# Patient Record
Sex: Female | Born: 1960
Health system: Southern US, Community
[De-identification: ages and names within clinical notes are randomized; demographics above are authoritative.]

## PROBLEM LIST (undated history)

## (undated) DIAGNOSIS — G43909 Migraine, unspecified, not intractable, without status migrainosus: Secondary | ICD-10-CM

## (undated) DIAGNOSIS — T8859XA Other complications of anesthesia, initial encounter: Secondary | ICD-10-CM

## (undated) DIAGNOSIS — N209 Urinary calculus, unspecified: Secondary | ICD-10-CM

## (undated) DIAGNOSIS — K5900 Constipation, unspecified: Secondary | ICD-10-CM

## (undated) HISTORY — DX: Migraine, unspecified, not intractable, without status migrainosus: G43.909

## (undated) HISTORY — DX: Urinary calculus, unspecified: N20.9

## (undated) HISTORY — DX: Constipation, unspecified: K59.00

## (undated) HISTORY — PX: OTHER SURGICAL HISTORY: SHX169

## (undated) HISTORY — PX: KIDNEY STONE SURGERY: SHX686

---

## 2002-02-18 ENCOUNTER — Encounter: Payer: Self-pay | Admitting: Internal Medicine

## 2002-02-18 ENCOUNTER — Inpatient Hospital Stay (HOSPITAL_COMMUNITY): Admission: EM | Admit: 2002-02-18 | Discharge: 2002-02-18 | Payer: Self-pay | Admitting: Emergency Medicine

## 2002-02-18 HISTORY — PX: ESOPHAGOGASTRODUODENOSCOPY: SHX1529

## 2002-02-23 ENCOUNTER — Encounter: Payer: Self-pay | Admitting: Internal Medicine

## 2002-02-23 ENCOUNTER — Ambulatory Visit (HOSPITAL_COMMUNITY): Admission: RE | Admit: 2002-02-23 | Discharge: 2002-02-23 | Payer: Self-pay | Admitting: Internal Medicine

## 2008-08-08 ENCOUNTER — Ambulatory Visit (HOSPITAL_COMMUNITY): Admission: RE | Admit: 2008-08-08 | Discharge: 2008-08-08 | Payer: Self-pay | Admitting: Internal Medicine

## 2008-08-17 ENCOUNTER — Ambulatory Visit (HOSPITAL_COMMUNITY): Admission: RE | Admit: 2008-08-17 | Discharge: 2008-08-17 | Payer: Self-pay | Admitting: Internal Medicine

## 2008-08-23 ENCOUNTER — Encounter (INDEPENDENT_AMBULATORY_CARE_PROVIDER_SITE_OTHER): Payer: Self-pay | Admitting: Internal Medicine

## 2008-08-23 ENCOUNTER — Encounter: Admission: RE | Admit: 2008-08-23 | Discharge: 2008-08-23 | Payer: Self-pay | Admitting: Internal Medicine

## 2008-08-23 HISTORY — PX: BREAST BIOPSY: SHX20

## 2009-03-13 ENCOUNTER — Encounter: Admission: RE | Admit: 2009-03-13 | Discharge: 2009-03-13 | Payer: Self-pay | Admitting: Internal Medicine

## 2009-08-22 ENCOUNTER — Encounter: Admission: RE | Admit: 2009-08-22 | Discharge: 2009-08-22 | Payer: Self-pay | Admitting: Internal Medicine

## 2009-10-31 ENCOUNTER — Ambulatory Visit (HOSPITAL_COMMUNITY): Admission: RE | Admit: 2009-10-31 | Discharge: 2009-10-31 | Payer: Self-pay | Admitting: Internal Medicine

## 2009-11-08 ENCOUNTER — Ambulatory Visit (HOSPITAL_COMMUNITY): Admission: RE | Admit: 2009-11-08 | Discharge: 2009-11-08 | Payer: Self-pay | Admitting: Internal Medicine

## 2010-07-15 ENCOUNTER — Encounter: Payer: Self-pay | Admitting: Internal Medicine

## 2010-07-17 ENCOUNTER — Other Ambulatory Visit: Payer: Self-pay | Admitting: Internal Medicine

## 2010-07-17 DIAGNOSIS — Z1239 Encounter for other screening for malignant neoplasm of breast: Secondary | ICD-10-CM

## 2010-08-27 ENCOUNTER — Ambulatory Visit
Admission: RE | Admit: 2010-08-27 | Discharge: 2010-08-27 | Disposition: A | Discharge status: Home / Self Care | Payer: BC Managed Care – PPO | Source: Ambulatory Visit | Attending: Internal Medicine | Admitting: Internal Medicine

## 2010-08-27 DIAGNOSIS — Z1239 Encounter for other screening for malignant neoplasm of breast: Secondary | ICD-10-CM

## 2010-11-08 NOTE — Op Note (Signed)
NAME:  Jasmine Fox, Jasmine Fox                         ACCOUNT NO.:  [id number]   MEDICAL RECORD NO.:  [id number]                   PATIENT TYPE:  INP   LOCATION:  A303                                 FACILITY:  [facility]   PHYSICIAN:  Gerrit Friends. Rourk, M.D.               DATE OF BIRTH:  12/07/1960   DATE OF PROCEDURE:  02/18/2002  DATE OF DISCHARGE:                                 OPERATIVE REPORT   PROCEDURE:  Esophagogastroduodenoscopy with Elease Hashimoto dilation.   ENDOSCOPIST:  Gerrit Friends. Rourk, M.D.   INDICATIONS FOR PROCEDURE:  The patient is a 50 year old lady who was  admitted to the hospital last night after an episode of acute dysphagia  after eating some fish sticks.  She had a sensation that food hung up and  she had some vomiting.  She is much better today.  She has had a couple of  episodes recently.  She does not have typical reflux symptoms although she  is belching quite a bit.  This has been ongoing for quite some time.  EGD is  now being done to further evaluate his symptoms.  This approach has been  discussed with the patient previously.  The potential risks, benefits, and  alternatives have been reviewed; and questions answered.  She is agreeable.  Please see the documentation in the markedly for more information.  I feel  that she at low risk for conscious sedation in the way of Versed and  Demerol.   DESCRIPTION OF PROCEDURE:  O2 saturation, blood pressure, pulse and  respirations were monitored throughout the entire procedure.   CONSCIOUS SEDATION:  Versed 5 mg IV, Demerol 75 mg IV in divided doses,  Cetacaine spray for topical oropharyngeal anesthesia.   INSTRUMENT:  Olympus video chip adult gastroscope.   FINDINGS:  Examination of the tubular esophagus revealed no mucosal  abnormalities.  There is no evidence of ring, stricture, web or other  abnormality.  The EG junction was easily traversed.   STOMACH:  The gastric cavity was empty.  It insufflated well with air.   A  thorough examination of the gastric mucosa including a retroflex view of the  proximal stomach and esophagogastric junction demonstrated no abnormalities.  The pylorus was patent and easily traversed.   DUODENUM:  The bulb and the second portion appeared normal.   THERAPY/DIAGNOSTIC MANEUVERS:  A 54 French Maloney dilator was passed to  full insertion without blood return with good patient tolerance.  A look  back into the esophagus and stomach revealed no apparent complication  related to the passage of the dilator.   The patient tolerated the procedure well and was reacted in endoscopy.   IMPRESSION:  1. Normal esophagus, stomach, and duodenum through the second portion.  2. Status post passage of 9 French Maloney dilator.  3. I suspect that the patient may well be having reflux symptoms over the     superimposed  element of globus.  4. It is possible that she swallowed too large of a bolus of food last     evening and then experienced a transient impaction in an otherwise normal     caliber esophagus.   RECOMMENDATIONS:  1. Low fat diet.  2. We will treat her with a PPI in the way of Protonix 40 mg orally daily.  3. Would discontinue Reglan and Pepcid after today.  4. Home same.  5. Would pursue further GI workup only if symptoms persist or recur.                                               Gerrit Friends. Rourk, M.D.    RMR/MEDQ  D:  02/18/2002  T:  02/19/2002  Job:  [id number]   cc:   Madelin Rear. Sherwood Gambler, M.D.  [address]  [place]  [place] [address]  Fax: [phone]   Dr. Owens Shark

## 2010-11-08 NOTE — Consult Note (Signed)
   NAME:  Jasmine Fox, Jasmine Fox                      ACCOUNT NO.:  [id number]   MEDICAL RECORD NO.:  [id number]                   PATIENT TYPE:  INP   LOCATION:  A303                                 FACILITY:  [facility]   PHYSICIAN:  Elfredia Nevins, MD                  DATE OF BIRTH:  1960-10-18   DATE OF CONSULTATION:  DATE OF DISCHARGE:  02/18/2002                                   CONSULTATION   HISTORY OF PRESENT ILLNESS:  For details regarding admission, please refer  to note.  Briefly, this 50 year old female with a history of migraine  headaches and urolithiasis presented to the emergency department with a  chief complaint of belching over the last several months.  She also had  cervical dysphagia.  She was admitted for further workup and evaluation.   HOSPITAL COURSE:  The patient did well with symptomatic therapy.  GI was  consulted.  An upper endoscopy was performed, which revealed mild stricture.  A Maloney dilator was employed.  Dr. Luvenia Starch assessment was suspected GERD  with an element of globus.  The patient did well and was stable for  discharge.   DISPOSITION:  Protonix 40 mg q.d.  She will be followed in three weeks as an  outpatient.                                                   Elfredia Nevins, MD    LF/MEDQ  D:  04/12/2002  T:  04/12/2002  Job:  [id number]

## 2010-11-08 NOTE — H&P (Signed)
NAME:  Jasmine Fox, Jasmine Fox                         ACCOUNT NO.:  [id number]   MEDICAL RECORD NO.:  [id number]                   PATIENT TYPE:  INP   LOCATION:  A303                                 FACILITY:  [facility]   PHYSICIAN:  Isidor Holts, M.D.               DATE OF BIRTH:  09-Dec-1960   DATE OF ADMISSION:  02/17/2002  DATE OF DISCHARGE:                                HISTORY & PHYSICAL   CHIEF COMPLAINT:  Lump in throat, burping excessively.   HISTORY OF PRESENT ILLNESS:  This is a 50 year old Caucasian female, history  essentially as above, who was quite well apart from increased tendency to  belch over the last couple of months.  Had dinner at 9:30 p.m. on February 17, 2002 which consisted of fish sticks and small potatoes.  At about  10 p.m. she felt as if something was stuck in her throat and found it  difficult to breath.  She stuck a finger down her throat and induced  vomiting without much relief.  Eventually came to the emergency room at  [facility].  Has been belching constantly since the episode  started.  No previous history of similar episodes.   PAST MEDICAL HISTORY:  1. Migraine headaches.  2. Urolithiasis.   ALLERGIES:  No known drug allergies.   MEDICATIONS:  1. Maxalt p.r.n.  2. Oral contraceptive medication.   SOCIAL HISTORY:  Single, no offspring.  Nonsmoker.  No history of alcohol  use or drug abuse.   SYSTEMS REVIEW:  Unremarkable.   PHYSICAL EXAMINATION:  VITAL SIGNS:  Temperature 97.9, pulse 97,  respirations 16, blood pressure 127/35.  GENERAL:  Burping a lot during the examination but was not short of breath  at rest.  Appeared somewhat anxious.  HEENT:  No clinical pallor, no jaundice, no conjunctival injection.  Throat  was clear.  NECK:  Supple.  She has a shotty nontender, pea-sized right jugular  digastric lymph node.  No other lymphadenopathy was palpable.  There was no  palpable goiter.  JVP was not seen.  CHEST:  Clinically  clear to auscultation.  There were no wheezes or  crackles.  HEART:  Heart sounds were normal, 1 and 2 were heard, regular.  There were  no murmurs.  ABDOMEN:  Flat, soft, nontender.  There was no palpable organomegaly.  Bowel  sounds were normal.  LOWER EXTREMITIES:  Unremarkable.   LABORATORY INVESTIGATIONS:  CBC:  WBC 8.7, hemoglobin 11.1, hematocrit 33.4,  platelets 314.  Chemistry:  Sodium 136, potassium 3.5, chloride 106,  carbonate 29, BUN 8, creatinine 0.7, glucose 126.   Chest x-ray was essentially normal.   ASSESSMENT/PLAN:  Admit to general medical floor.   1. Dysphagia.  Differential diagnosis includes esophageal ring,     gastroesophageal reflux disease/esophagitis, globus hystericus, foreign     body.  Will keep n.p.o. for now.  Commence intravenous fluids, proton  pump inhibitors, and antiemetics.  Request GI consultation for possible     EGD, i.e. Dr. Karilyn Cota.  2. History of migraine, stable.   Further management will depend on clinical course.                                                Isidor Holts, M.D.    CO/MEDQ  D:  02/18/2002  T:  02/18/2002  Job:  [id number]   cc:   Madelin Rear. Sherwood Gambler, M.D.  [address]  [place]  [place] [address]  Fax: [phone]

## 2010-11-08 NOTE — Consult Note (Signed)
NAME:  Jasmine Fox, Jasmine Fox                         ACCOUNT NO.:  [id number]   MEDICAL RECORD NO.:  [id number]                   PATIENT TYPE:  INP   LOCATION:  A303                                 FACILITY:  [facility]   PHYSICIAN:  Gerrit Friends. Rourk, M.D.               DATE OF BIRTH:  1960-09-19   DATE OF CONSULTATION:  02/18/2002  DATE OF DISCHARGE:                                   CONSULTATION   PHYSICIAN REQUESTING CONSULTATION:  Dr. Isidor Holts for Dr. Madelin Rear.  Fusco.   REASON FOR CONSULTATION:  Dysphagia.   HISTORY OF PRESENT ILLNESS:  The patient is a pleasant 50 year old Caucasian  female with a history of migraine headaches and urolithiasis, who presented  to the emergency department late yesterday evening with complaints of  dysphagia and difficulty swallowing.  She had eaten dinner about 9:30 p.m.  last night, which consisted of fish sticks and potatoes.  She felt like the  food was stuck in her esophagus.  She developed difficulty breathing.  She  induced vomiting, however, did not get any relief; she therefore came to the  emergency department.  She also complains of excessive belching which  occurred last night as well as for the last several months.  She has had a  couple of episodes of possible esophageal food impaction this week after  eating a sandwich.  She was able to relieve this by vomiting.  Denies any  typical heartburn symptoms, nausea, abdominal pain, melena, bright red blood  per rectum, constipation or diarrhea.  She complains of some odynophagia  with this particular episode.  Denies any weight loss.  She takes Goody's  powders at least twice a week for migraine headache.  No other NSAIDs.  This  morning, she feels like the food has finally passed.  She is not having any  difficulty swallowing, breathing or pain.  She is currently on Pepcid 20 mg  IV q.12h. and Reglan 10 mg IV q.8h.   MEDICATIONS PRIOR TO ADMISSION:  1. Maxalt p.r.n.  2. Oral  contraceptive pills.  3. Goody's powders, two per week.  4. Tylenol p.r.n.   ALLERGIES:  No known drug allergies.   PAST MEDICAL HISTORY:  Migraine headache; history of urolithiasis, diagnosed  in the ED, however, the patient denies passing a stone.   FAMILY HISTORY:  Negative for chronic GI illnesses or colorectal cancer.  Father had gallbladder disease.   SOCIAL HISTORY:  She is currently going through a divorce.  She has no  children.  No history of tobacco use.  No alcohol.   REVIEW OF SYSTEMS:  Please see HPI for GI and GENERAL.  CARDIOPULMONARY:  Denies any chest pain or diaphoresis with this episode.   PHYSICAL EXAMINATION:  VITAL SIGNS:  Height 5 feet 5 inches.  Weight 127.4.  Temperature 97.7, T current 97.2; pulse 71; respirations 18; blood pressure  102/41.  GENERAL:  A very pleasant, well-nourished, well-developed Caucasian female  in no acute distress.  SKIN:  Warm and dry.  No jaundice.  HEENT:  Conjunctivae are pink.  Sclerae nonicteric.  Pupils equal, round and  reactive to light.  Oropharyngeal mucosa moist and pink.  No lesions,  erythema or exudate.  NECK:  No lymphadenopathy or thyromegaly.  CHEST:  Lungs are clear to auscultation.  CARDIAC:  Exam reveals regular rate and rhythm, normal S1 and S2, no  murmurs, rubs, or gallops.  ABDOMEN:  Positive bowel sounds.  Flat, nondistended, nontender.  No  organomegaly or masses.  EXTREMITIES:  No edema.   LABORATORY DATA:  Hemoglobin 11.1, hematocrit 33.4, MCV 84.5, WBC 8.7,  platelets 314,000.  PT 12.8, INR 1.1, PTT 26.  Sodium 139, potassium 3.8,  BUN 7, creatinine 0.6, glucose 92, calcium 8.3.   IMPRESSION:  The patient is a pleasant 50 year old Caucasian female with a  one-week history of dysphagia to solid foods and possible esophageal food  impaction on two to three occasions; she had one last night which would not  resolve with induced vomiting, however, now, she feel like the food has  finally passed.   She also complains of a several-month history of excessive  belching.  I suspect that she has developed an esophageal web, ring or  stricture.  She uses nonsteroidal anti-inflammatory drugs regularly.  Excessive belching could be a symptom of gastroesophageal reflux disease but  also would consider biliary etiology.  She is noted to have mild normocytic  anemia on admission, maybe secondary to menstruation or a current upper  gastrointestinal etiology and should be further investigated.   SUGGESTIONS:  EGD today.  Continue Pepcid for now.  Based on findings, I  would consider further workup of anemia.   I would like to thank Dr. Brien Few for allowing [place] to take part in the care of  this patient.      Tana Coast, P.A.                        Gerrit Friends. Rourk, M.D.    LL/MEDQ  D:  02/18/2002  T:  02/18/2002  Job:  [id number]   cc:   Madelin Rear. Sherwood Gambler, M.D.  [address]  [place]  [place] [address]  Fax: [phone]   Gerrit Friends. Rourk, M.D.

## 2011-04-18 ENCOUNTER — Encounter: Payer: Self-pay | Admitting: Internal Medicine

## 2011-05-08 ENCOUNTER — Other Ambulatory Visit: Payer: BC Managed Care – PPO | Admitting: Internal Medicine

## 2011-05-22 ENCOUNTER — Other Ambulatory Visit: Payer: BC Managed Care – PPO | Admitting: Internal Medicine

## 2011-06-03 ENCOUNTER — Other Ambulatory Visit: Payer: BC Managed Care – PPO | Admitting: Internal Medicine

## 2011-07-21 ENCOUNTER — Other Ambulatory Visit: Payer: Self-pay | Admitting: Internal Medicine

## 2011-07-21 DIAGNOSIS — Z1231 Encounter for screening mammogram for malignant neoplasm of breast: Secondary | ICD-10-CM

## 2011-08-28 ENCOUNTER — Ambulatory Visit
Admission: RE | Admit: 2011-08-28 | Discharge: 2011-08-28 | Disposition: A | Discharge status: Home / Self Care | Payer: BC Managed Care – PPO | Source: Ambulatory Visit | Attending: Internal Medicine | Admitting: Internal Medicine

## 2011-08-28 DIAGNOSIS — Z1231 Encounter for screening mammogram for malignant neoplasm of breast: Secondary | ICD-10-CM

## 2012-07-26 ENCOUNTER — Other Ambulatory Visit: Payer: Self-pay | Admitting: Internal Medicine

## 2012-07-26 DIAGNOSIS — Z1231 Encounter for screening mammogram for malignant neoplasm of breast: Secondary | ICD-10-CM

## 2012-08-31 ENCOUNTER — Ambulatory Visit: Payer: BC Managed Care – PPO

## 2012-09-07 ENCOUNTER — Ambulatory Visit: Payer: BC Managed Care – PPO

## 2012-09-24 ENCOUNTER — Ambulatory Visit (INDEPENDENT_AMBULATORY_CARE_PROVIDER_SITE_OTHER): Payer: BC Managed Care – PPO

## 2012-09-24 ENCOUNTER — Encounter: Payer: Self-pay | Admitting: Obstetrics & Gynecology

## 2012-09-24 ENCOUNTER — Ambulatory Visit (INDEPENDENT_AMBULATORY_CARE_PROVIDER_SITE_OTHER): Payer: BC Managed Care – PPO | Admitting: Obstetrics & Gynecology

## 2012-09-24 VITALS — BP 140/72 | Ht 65.0 in | Wt 126.0 lb

## 2012-09-24 DIAGNOSIS — N83209 Unspecified ovarian cyst, unspecified side: Secondary | ICD-10-CM | POA: Insufficient documentation

## 2012-09-24 NOTE — Patient Instructions (Signed)
Ovarian Cyst The ovaries are small organs that are on each side of the uterus. The ovaries are the organs that produce the female hormones, estrogen and progesterone. An ovarian cyst is a sac filled with fluid that can vary in its size. It is normal for a small cyst to form in women who are in the childbearing age and who have menstrual periods. This type of cyst is called a follicle cyst that becomes an ovulation cyst (corpus luteum cyst) after it produces the women's egg. It later goes away on its own if the woman does not become pregnant. There are other kinds of ovarian cysts that may cause problems and may need to be treated. The most serious problem is a cyst with cancer. It should be noted that menopausal women who have an ovarian cyst are at a higher risk of it being a cancer cyst. They should be evaluated very quickly, thoroughly and followed closely. This is especially true in menopausal women because of the high rate of ovarian cancer in women in menopause. CAUSES AND TYPES OF OVARIAN CYSTS:  FUNCTIONAL CYST: The follicle/corpus luteum cyst is a functional cyst that occurs every month during ovulation with the menstrual cycle. They go away with the next menstrual cycle if the woman does not get pregnant. Usually, there are no symptoms with a functional cyst.  ENDOMETRIOMA CYST: This cyst develops from the lining of the uterus tissue. This cyst gets in or on the ovary. It grows every month from the bleeding during the menstrual period. It is also called a "chocolate cyst" because it becomes filled with blood that turns brown. This cyst can cause pain in the lower abdomen during intercourse and with your menstrual period.  CYSTADENOMA CYST: This cyst develops from the cells on the outside of the ovary. They usually are not cancerous. They can get very big and cause lower abdomen pain and pain with intercourse. This type of cyst can twist on itself, cut off its blood supply and cause severe pain. It  also can easily rupture and cause a lot of pain.  DERMOID CYST: This type of cyst is sometimes found in both ovaries. They are found to have different kinds of body tissue in the cyst. The tissue includes skin, teeth, hair, and/or cartilage. They usually do not have symptoms unless they get very big. Dermoid cysts are rarely cancerous.  POLYCYSTIC OVARY: This is a rare condition with hormone problems that produces many small cysts on both ovaries. The cysts are follicle-like cysts that never produce an egg and become a corpus luteum. It can cause an increase in body weight, infertility, acne, increase in body and facial hair and lack of menstrual periods or rare menstrual periods. Many women with this problem develop type 2 diabetes. The exact cause of this problem is unknown. A polycystic ovary is rarely cancerous.  THECA LUTEIN CYST: Occurs when too much hormone (human chorionic gonadotropin) is produced and over-stimulates the ovaries to produce an egg. They are frequently seen when doctors stimulate the ovaries for invitro-fertilization (test tube babies).  LUTEOMA CYST: This cyst is seen during pregnancy. Rarely it can cause an obstruction to the birth canal during labor and delivery. They usually go away after delivery. SYMPTOMS   Pelvic pain or pressure.  Pain during sexual intercourse.  Increasing girth (swelling) of the abdomen.  Abnormal menstrual periods.  Increasing pain with menstrual periods.  You stop having menstrual periods and you are not pregnant. DIAGNOSIS  The diagnosis can   be made during:  Routine or annual pelvic examination (common).  Ultrasound.  X-ray of the pelvis.  CT Scan.  MRI.  Blood tests. TREATMENT   Treatment may only be to follow the cyst monthly for 2 to 3 months with your caregiver. Many go away on their own, especially functional cysts.  May be aspirated (drained) with a long needle with ultrasound, or by laparoscopy (inserting a tube into  the pelvis through a small incision).  The whole cyst can be removed by laparoscopy.  Sometimes the cyst may need to be removed through an incision in the lower abdomen.  Hormone treatment is sometimes used to help dissolve certain cysts.  Birth control pills are sometimes used to help dissolve certain cysts. HOME CARE INSTRUCTIONS  Follow your caregiver's advice regarding:  Medicine.  Follow up visits to evaluate and treat the cyst.  You may need to come back or make an appointment with another caregiver, to find the exact cause of your cyst, if your caregiver is not a gynecologist.  Get your yearly and recommended pelvic examinations and Pap tests.  Let your caregiver know if you have had an ovarian cyst in the past. SEEK MEDICAL CARE IF:   Your periods are late, irregular, they stop, or are painful.  Your stomach (abdomen) or pelvic pain does not go away.  Your stomach becomes larger or swollen.  You have pressure on your bladder or trouble emptying your bladder completely.  You have painful sexual intercourse.  You have feelings of fullness, pressure, or discomfort in your stomach.  You lose weight for no apparent reason.  You feel generally ill.  You become constipated.  You lose your appetite.  You develop acne.  You have an increase in body and facial hair.  You are gaining weight, without changing your exercise and eating habits.  You think you are pregnant. SEEK IMMEDIATE MEDICAL CARE IF:   You have increasing abdominal pain.  You feel sick to your stomach (nausea) and/or vomit.  You develop a fever that comes on suddenly.  You develop abdominal pain during a bowel movement.  Your menstrual periods become heavier than usual. Document Released: 06/09/2005 Document Revised: 09/01/2011 Document Reviewed: 04/12/2009 [employer] Patient Information 2013 [employer].  

## 2012-09-24 NOTE — Progress Notes (Signed)
Patient ID: Jasmine Fox, female   DOB: 01-18-1961, 52 y.o.   MRN: [id number] Referred for evaluation of an ovarin cyst seen on CT scan last Friday, 3.29.2014.  Patient had middle of the night onset of nausea, vomiting and diarrhea, no fever.  Had CT scan at [facility] and was negative for an appendix but found a 5 cm right ovarian cyst.  She is here for evaluation.  Exam NEFG Vagina pink moist no disharge Uterus NSSC NT Ovaries feel normal no masses palpable  Sonogram reveals normal uterus and ovaries with no free fluid noted at all  Impression No current evidence of  Cyst no evidence of  Cystic rupture  Plan  No specific follow up needed, although pt needs yearly exam  Kendryck Lacroix H 11:00 AM 09/24/2012

## 2012-09-28 ENCOUNTER — Ambulatory Visit
Admission: RE | Admit: 2012-09-28 | Discharge: 2012-09-28 | Disposition: A | Discharge status: Home / Self Care | Payer: BC Managed Care – PPO | Source: Ambulatory Visit | Attending: Internal Medicine | Admitting: Internal Medicine

## 2012-09-28 DIAGNOSIS — Z1231 Encounter for screening mammogram for malignant neoplasm of breast: Secondary | ICD-10-CM

## 2012-10-11 ENCOUNTER — Telehealth: Payer: Self-pay

## 2012-10-11 NOTE — Telephone Encounter (Signed)
Pt called to schedule colonoscopy. Having some constipation. OV with Tana Coast, PA on 10/26/2012 at 8:00 AM.

## 2012-10-26 ENCOUNTER — Encounter: Payer: Self-pay | Admitting: Gastroenterology

## 2012-10-26 ENCOUNTER — Ambulatory Visit (INDEPENDENT_AMBULATORY_CARE_PROVIDER_SITE_OTHER): Payer: BC Managed Care – PPO | Admitting: Gastroenterology

## 2012-10-26 ENCOUNTER — Encounter (HOSPITAL_COMMUNITY): Payer: Self-pay | Admitting: Pharmacy Technician

## 2012-10-26 VITALS — BP 101/65 | HR 61 | Temp 96.8°F | Ht 65.0 in | Wt 124.2 lb

## 2012-10-26 DIAGNOSIS — K5909 Other constipation: Secondary | ICD-10-CM | POA: Insufficient documentation

## 2012-10-26 DIAGNOSIS — R1903 Right lower quadrant abdominal swelling, mass and lump: Secondary | ICD-10-CM | POA: Insufficient documentation

## 2012-10-26 DIAGNOSIS — K5900 Constipation, unspecified: Secondary | ICD-10-CM

## 2012-10-26 MED ORDER — POLYETHYLENE GLYCOL 3350 17 GM/SCOOP PO POWD: ORAL | Status: DC

## 2012-10-26 MED ORDER — PEG 3350-KCL-NA BICARB-NACL 420 G PO SOLR: 4000.0000 mL | ORAL | Status: DC

## 2012-10-26 NOTE — Progress Notes (Signed)
CC PCP 

## 2012-10-26 NOTE — Assessment & Plan Note (Signed)
52 year old lady with chronic constipation, worse of the last several years. No prior colonoscopy. No family history of colon cancer.   1. Begin MiraLax 17 g twice a day for 3 days followed by once daily as needed for constipation. 2. Colonoscopy in the near future.  I have discussed the risks, alternatives, benefits with regards to but not limited to the risk of reaction to medication, bleeding, infection, perforation and the patient is agreeable to proceed. Written consent to be obtained.

## 2012-10-26 NOTE — Progress Notes (Signed)
Primary Care Physician:  Cassell Smiles., MD  Primary Gastroenterologist:  Roetta Sessions, MD   Chief Complaint  Patient presents with  . Constipation    schedule colonoscopy    HPI:  Jasmine Fox is a 52 y.o. female here to schedule first ever colonoscopy. Worried about 5cm right lower abdominal cyst seen on CT 08/2012.  Described as 5.1 cm septated cystic process in the right pelvis, more likely of ovarian than appendiceal origin. Patient reports that she saw all her OB/GYN and family tree and underwent pelvic ultrasound with no abnormality seen related to the ovaries. She denies any abdominal pain or bloating. She is postmenopausal. Denies any weight loss. Chronic constipation worse over the last several years. Has tried metamucil but doesn't help. Drinks a lot of water. Eat fruits. Walks daily. Lots of straining when gets an urge. No rectal bleeding. Couple BMs per week. Belching a lot at night after evening meal. No heartburn. No dysphagia. No melena. Rarely takes Goody's powders.   Current Outpatient Prescriptions  Medication Sig Dispense Refill  . NON FORMULARY Goody powders only occasionally       No current facility-administered medications for this visit.    Allergies as of 10/26/2012  . (No Known Allergies)    Past Medical History  Diagnosis Date  . Migraine   . Urolithiasis     Past Surgical History  Procedure Laterality Date  . Lt bunion surgery    . Esophagogastroduodenoscopy  02/18/2002    ZOX:WRUEAV esophagus/s/p 54 [place] Maloney dilator  . Kidney stone surgery      Family History  Problem Relation Age of Onset  . Heart disease Mother     congestive heart failure  . Diabetes Father   . Hypertension Father   . Cancer Brother     brain cancer  . Hypertension Brother   . Colon cancer Neg Hx     History   Social History  . Marital Status: Married    Spouse Name: N/A    Number of Children: 0  . Years of Education: N/A   Occupational History  .   [employer]   Social History Main Topics  . Smoking status: Former Games developer  . Smokeless tobacco: Not on file     Comment: Only about 2 packs cigarettes in lifetime  . Alcohol Use: No  . Drug Use: No  . Sexually Active: No   Other Topics Concern  . Not on file   Social History Narrative  . No narrative on file      ROS:  General: Negative for anorexia, weight loss, fever, chills, fatigue, weakness. Eyes: Negative for vision changes.  ENT: Negative for hoarseness, difficulty swallowing , nasal congestion. CV: Negative for chest pain, angina, palpitations, dyspnea on exertion, peripheral edema.  Respiratory: Negative for dyspnea at rest, dyspnea on exertion, cough, sputum, wheezing.  GI: See history of present illness. GU:  Negative for dysuria, hematuria, urinary incontinence, urinary frequency, nocturnal urination.  MS: Negative for joint pain, low back pain.  Derm: Negative for rash or itching.  Neuro: Negative for weakness, abnormal sensation, seizure, frequent headaches, memory loss, confusion.  Psych: Negative for anxiety, depression, suicidal ideation, hallucinations.  Endo: Negative for unusual weight change.  Heme: Negative for bruising or bleeding. Allergy: Negative for rash or hives.    Physical Examination:  BP 101/65  Pulse 61  Temp(Src) 96.8 F (36 C)  Ht 5\' 5"  (1.651 m)  Wt 124 lb 3.2 oz (56.337 kg)  BMI 20.67  kg/m2   General: Well-nourished, well-developed in no acute distress.  Head: Normocephalic, atraumatic.   Eyes: Conjunctiva pink, no icterus. Mouth: Oropharyngeal mucosa moist and pink , no lesions erythema or exudate. Neck: Supple without thyromegaly, masses, or lymphadenopathy.  Lungs: Clear to auscultation bilaterally.  Heart: Regular rate and rhythm, no murmurs rubs or gallops.  Abdomen: Bowel sounds are normal, nontender, nondistended, no hepatosplenomegaly or masses, no abdominal bruits or    hernia , no rebound or guarding.   Rectal:  deferred Extremities: No lower extremity edema. No clubbing or deformities.  Neuro: Alert and oriented x 4 , grossly normal neurologically.  Skin: Warm and dry, no rash or jaundice.   Psych: Alert and cooperative, normal mood and affect.    CT of the abdomen pelvis without contrast done at [facility], March 2014    IMPRESSION:  1. Mild wedge compression fracture deformity of L1, new since 11/08/2009 2. 5.1 cm septated cystic process in the right pelvis, more likely of ovarian than appendiceal origin. Pelvic ultrasound suggested for further characterization. 3. Stable benign hepatic hemangiomas.

## 2012-10-26 NOTE — Patient Instructions (Addendum)
1. Begin MiraLax 17 g twice a day for 3 days. Then continue daily as needed for constipation. 2. We have scheduled you for a colonoscopy with Dr. Jena Gauss. Please see separate instructions. 3. I will review your CT scan with the radiologist at [facility].

## 2012-10-26 NOTE — Assessment & Plan Note (Signed)
History of 5.1 cm right pelvic cystic mass. Pelvic ultrasound by Dr. Despina Hidden was unremarkable. Will review CT with Dr. Tyron Russell. Further recommendations to follow. Colonoscopy as scheduled.

## 2012-11-10 ENCOUNTER — Ambulatory Visit (HOSPITAL_COMMUNITY)
Admission: RE | Admit: 2012-11-10 | Discharge: 2012-11-10 | Disposition: A | Discharge status: Home / Self Care | Payer: BC Managed Care – PPO | Source: Ambulatory Visit | Attending: Internal Medicine | Admitting: Internal Medicine

## 2012-11-10 ENCOUNTER — Encounter (HOSPITAL_COMMUNITY): Payer: Self-pay

## 2012-11-10 ENCOUNTER — Encounter (HOSPITAL_COMMUNITY)
Admission: RE | Disposition: A | Discharge status: Home / Self Care | Payer: Self-pay | Source: Ambulatory Visit | Attending: Internal Medicine

## 2012-11-10 DIAGNOSIS — Z1211 Encounter for screening for malignant neoplasm of colon: Secondary | ICD-10-CM

## 2012-11-10 DIAGNOSIS — K5909 Other constipation: Secondary | ICD-10-CM

## 2012-11-10 DIAGNOSIS — R1903 Right lower quadrant abdominal swelling, mass and lump: Secondary | ICD-10-CM

## 2012-11-10 HISTORY — PX: COLONOSCOPY: SHX5424

## 2012-11-10 SURGERY — COLONOSCOPY
Anesthesia: Moderate Sedation

## 2012-11-10 MED ORDER — ONDANSETRON HCL 4 MG/2ML IJ SOLN
INTRAMUSCULAR | Status: AC
  Filled 2012-11-10: qty 2

## 2012-11-10 MED ORDER — SODIUM CHLORIDE 0.9 % IV SOLN
INTRAVENOUS | Status: DC
  Administered 2012-11-10: 11:00:00 via INTRAVENOUS

## 2012-11-10 MED ORDER — ONDANSETRON HCL 4 MG/2ML IJ SOLN
INTRAMUSCULAR | Status: DC | PRN
  Administered 2012-11-10: 4 mg via INTRAVENOUS

## 2012-11-10 MED ORDER — MIDAZOLAM HCL 5 MG/5ML IJ SOLN
INTRAMUSCULAR | Status: AC
  Filled 2012-11-10: qty 10

## 2012-11-10 MED ORDER — MEPERIDINE HCL 100 MG/ML IJ SOLN
INTRAMUSCULAR | Status: AC
  Filled 2012-11-10: qty 1

## 2012-11-10 MED ORDER — MEPERIDINE HCL 100 MG/ML IJ SOLN
INTRAMUSCULAR | Status: DC | PRN
  Administered 2012-11-10 (×2): 50 mg via INTRAVENOUS

## 2012-11-10 MED ORDER — MIDAZOLAM HCL 5 MG/5ML IJ SOLN
INTRAMUSCULAR | Status: DC | PRN
  Administered 2012-11-10: 2 mg via INTRAVENOUS
  Administered 2012-11-10: 1 mg via INTRAVENOUS
  Administered 2012-11-10: 2 mg via INTRAVENOUS
  Administered 2012-11-10 (×2): 1 mg via INTRAVENOUS

## 2012-11-10 MED ORDER — STERILE WATER FOR IRRIGATION IR SOLN
Status: DC | PRN
  Administered 2012-11-10: 11:00:00

## 2012-11-10 NOTE — Op Note (Signed)
[facility] [address] [place], [address]   COLONOSCOPY PROCEDURE REPORT  PATIENT: Jasmine Fox, Jasmine Fox  MR#:         [id number] BIRTHDATE: [birth year] , 51  yrs. old GENDER: Female ENDOSCOPIST: R.  Roetta Sessions, MD FACP FACG REFERRED BY:  Artis Delay, M.D.  Duane Lope, M.D. PROCEDURE DATE:  11/10/2012 PROCEDURE:     Screening ileocolonoscopy  INDICATIONS: First ever average risk screening examination  INFORMED CONSENT:  The risks, benefits, alternatives and imponderables including but not limited to bleeding, perforation as well as the possibility of a missed lesion have been reviewed.  The potential for biopsy, lesion removal, etc. have also been discussed.  Questions have been answered.  All parties agreeable. Please see the history and physical in the medical record for more information.  MEDICATIONS: Versed 7 mg IV and Demerol 100 mg IV in divided doses. Zofran 4 mg IV  DESCRIPTION OF PROCEDURE:  After a digital rectal exam was performed, the EC-[id number] ([id number])  colonoscope was advanced from the anus through the rectum and colon to the area of the cecum, ileocecal valve and appendiceal orifice.  The cecum was deeply intubated.  These structures were well-seen and photographed for the record.  From the level of the cecum and ileocecal valve, the scope was slowly and cautiously withdrawn.  The mucosal surfaces were carefully surveyed utilizing scope tip deflection to facilitate fold flattening as needed.  The scope was pulled down into the rectum where a thorough examination including retroflexion was performed.    FINDINGS:  Adequate preparation. Normal rectum. Normal appearing colonic mucosa. The distal 5 cm of terminal ileal mucosa appeared normal.  THERAPEUTIC / DIAGNOSTIC MANEUVERS PERFORMED:  None  COMPLICATIONS: None  CECAL WITHDRAWAL TIME:  10 minutes  IMPRESSION:  Normal ileocolonoscopy  RECOMMENDATIONS: Repeat screening colonoscopy in  10 years   _______________________________ eSigned:  R. Roetta Sessions, MD FACP [facility] 11/10/2012 12:03 PM   CC:

## 2012-11-10 NOTE — H&P (View-Only) (Signed)
Primary Care Physician:  FUSCO,LAWRENCE J., MD  Primary Gastroenterologist:  Michael Rourk, MD   Chief Complaint  Patient presents with  . Constipation    schedule colonoscopy    HPI:  Jasmine Fox is a 51 y.o. female here to schedule first ever colonoscopy. Worried about 5cm right lower abdominal cyst seen on CT 08/2012.  Described as 5.1 cm septated cystic process in the right pelvis, more likely of ovarian than appendiceal origin. Patient reports that she saw all her OB/GYN and family tree and underwent pelvic ultrasound with no abnormality seen related to the ovaries. She denies any abdominal pain or bloating. She is postmenopausal. Denies any weight loss. Chronic constipation worse over the last several years. Has tried metamucil but doesn't help. Drinks a lot of water. Eat fruits. Walks daily. Lots of straining when gets an urge. No rectal bleeding. Couple BMs per week. Belching a lot at night after evening meal. No heartburn. No dysphagia. No melena. Rarely takes Goody's powders.   Current Outpatient Prescriptions  Medication Sig Dispense Refill  . NON FORMULARY Goody powders only occasionally       No current facility-administered medications for this visit.    Allergies as of 10/26/2012  . (No Known Allergies)    Past Medical History  Diagnosis Date  . Migraine   . Urolithiasis     Past Surgical History  Procedure Laterality Date  . Lt bunion surgery    . Esophagogastroduodenoscopy  02/18/2002    RMR:Normal esophagus/s/p 54 French Maloney dilator  . Kidney stone surgery      Family History  Problem Relation Age of Onset  . Heart disease Mother     congestive heart failure  . Diabetes Father   . Hypertension Father   . Cancer Brother     brain cancer  . Hypertension Brother   . Colon cancer Neg Hx     History   Social History  . Marital Status: Married    Spouse Name: N/A    Number of Children: 0  . Years of Education: N/A   Occupational History  .   [employer]   Social History Main Topics  . Smoking status: Former Smoker  . Smokeless tobacco: Not on file     Comment: Only about 2 packs cigarettes in lifetime  . Alcohol Use: No  . Drug Use: No  . Sexually Active: No   Other Topics Concern  . Not on file   Social History Narrative  . No narrative on file      ROS:  General: Negative for anorexia, weight loss, fever, chills, fatigue, weakness. Eyes: Negative for vision changes.  ENT: Negative for hoarseness, difficulty swallowing , nasal congestion. CV: Negative for chest pain, angina, palpitations, dyspnea on exertion, peripheral edema.  Respiratory: Negative for dyspnea at rest, dyspnea on exertion, cough, sputum, wheezing.  GI: See history of present illness. GU:  Negative for dysuria, hematuria, urinary incontinence, urinary frequency, nocturnal urination.  MS: Negative for joint pain, low back pain.  Derm: Negative for rash or itching.  Neuro: Negative for weakness, abnormal sensation, seizure, frequent headaches, memory loss, confusion.  Psych: Negative for anxiety, depression, suicidal ideation, hallucinations.  Endo: Negative for unusual weight change.  Heme: Negative for bruising or bleeding. Allergy: Negative for rash or hives.    Physical Examination:  BP 101/65  Pulse 61  Temp(Src) 96.8 F (36 C)  Ht 5' 5" (1.651 m)  Wt 124 lb 3.2 oz (56.337 kg)  BMI 20.67   kg/m2   General: Well-nourished, well-developed in no acute distress.  Head: Normocephalic, atraumatic.   Eyes: Conjunctiva pink, no icterus. Mouth: Oropharyngeal mucosa moist and pink , no lesions erythema or exudate. Neck: Supple without thyromegaly, masses, or lymphadenopathy.  Lungs: Clear to auscultation bilaterally.  Heart: Regular rate and rhythm, no murmurs rubs or gallops.  Abdomen: Bowel sounds are normal, nontender, nondistended, no hepatosplenomegaly or masses, no abdominal bruits or    hernia , no rebound or guarding.   Rectal:  deferred Extremities: No lower extremity edema. No clubbing or deformities.  Neuro: Alert and oriented x 4 , grossly normal neurologically.  Skin: Warm and dry, no rash or jaundice.   Psych: Alert and cooperative, normal mood and affect.    CT of the abdomen pelvis without contrast done at [facility], March 2014    IMPRESSION:  1. Mild wedge compression fracture deformity of L1, new since 11/08/2009 2. 5.1 cm septated cystic process in the right pelvis, more likely of ovarian than appendiceal origin. Pelvic ultrasound suggested for further characterization. 3. Stable benign hepatic hemangiomas.   

## 2012-11-10 NOTE — Interval H&P Note (Signed)
History and Physical Interval Note:  11/10/2012 11:14 AM  Jasmine Fox  has presented today for surgery, with the diagnosis of CONSTIPATION, RIGHT PELVIC MASS ON CT  The various methods of treatment have been discussed with the patient and family. After consideration of risks, benefits and other options for treatment, the patient has consented to  Procedure(s) with comments: COLONOSCOPY (N/A) - 11:15 as a surgical intervention .  The patient's history has been reviewed, patient examined, no change in status, stable for surgery.  I have reviewed the patient's chart and labs.  Questions were answered to the patient's satisfaction.      Colonoscopy-first ever screening examination. Constipation better with MiraLax.  The risks, benefits, limitations, alternatives and imponderables have been reviewed with the patient. Questions have been answered. All parties are agreeable.   Eula Listen

## 2012-11-12 ENCOUNTER — Encounter (HOSPITAL_COMMUNITY): Payer: Self-pay | Admitting: Internal Medicine

## 2012-11-26 ENCOUNTER — Telehealth: Payer: Self-pay | Admitting: Gastroenterology

## 2012-11-26 NOTE — Telephone Encounter (Addendum)
Message copied by Tiffany Kocher on Fri Nov 26, 2012  8:05 AM ------      Message from: Corbin Ade      Created: Wed Nov 24, 2012  7:53 AM       I do recall looking at the CT with the radiologist. Nonspecific finding. Subsequent vaginal ultrasound revealed no pelvic mass. Dr. Despina Hidden told no patient further evaluation needed.       ----- Message -----         From: Tiffany Kocher, PA-C         Sent: 11/22/2012  10:01 PM           To: Corbin Ade, MD            Did you ever look at the CT on this one? She had large pelvic cystic lesion.

## 2013-09-26 ENCOUNTER — Other Ambulatory Visit: Payer: BC Managed Care – PPO | Admitting: Obstetrics & Gynecology

## 2013-10-04 ENCOUNTER — Other Ambulatory Visit (HOSPITAL_COMMUNITY)
Admission: RE | Admit: 2013-10-04 | Discharge: 2013-10-04 | Disposition: A | Discharge status: Home / Self Care | Payer: BC Managed Care – PPO | Source: Ambulatory Visit | Attending: Obstetrics & Gynecology | Admitting: Obstetrics & Gynecology

## 2013-10-04 ENCOUNTER — Other Ambulatory Visit: Payer: Self-pay

## 2013-10-04 ENCOUNTER — Ambulatory Visit (INDEPENDENT_AMBULATORY_CARE_PROVIDER_SITE_OTHER): Payer: BC Managed Care – PPO | Admitting: Obstetrics & Gynecology

## 2013-10-04 ENCOUNTER — Encounter: Payer: Self-pay | Admitting: Obstetrics & Gynecology

## 2013-10-04 VITALS — BP 110/80 | Ht 64.0 in | Wt 130.0 lb

## 2013-10-04 DIAGNOSIS — Z1231 Encounter for screening mammogram for malignant neoplasm of breast: Secondary | ICD-10-CM

## 2013-10-04 DIAGNOSIS — Z01419 Encounter for gynecological examination (general) (routine) without abnormal findings: Secondary | ICD-10-CM | POA: Insufficient documentation

## 2013-10-04 DIAGNOSIS — Z1151 Encounter for screening for human papillomavirus (HPV): Secondary | ICD-10-CM | POA: Insufficient documentation

## 2013-10-04 NOTE — Progress Notes (Signed)
Patient ID: Jasmine Fox, female   DOB: 02/18/1961, 53 y.o.   MRN: [id number] Subjective:     Jasmine PeaSharon D Munch is a 53 y.o. female here for a routine exam.  No LMP recorded. Patient is postmenopausal. G1P1 Birth Control Method:  None, post menopausal Menstrual Calendar(currently): na  Current complaints: none.   Current acute medical issues:  none   Recent Gynecologic History No LMP recorded. Patient is postmenopausal. Last Pap: 2014,  normal Last mammogram: 2104,  normal  Past Medical History  Diagnosis Date  . Migraine     none since 2013  . Urolithiasis     Past Surgical History  Procedure Laterality Date  . Lt bunion surgery    . Esophagogastroduodenoscopy  02/18/2002    WUJ:[id number]:Normal esophagus/s/p 54 [place] Maloney dilator  . Kidney stone surgery    . Colonoscopy N/A 11/10/2012    Procedure: COLONOSCOPY;  Surgeon: Corbin Adeobert M Rourk, MD;  Location: [facility];  Service: Endoscopy;  Laterality: N/A;  11:15    OB History   Grav Para Term Preterm Abortions TAB SAB Ect Mult Living   1 1              History   Social History  . Marital Status: Married    Spouse Name: N/A    Number of Children: 0  . Years of Education: N/A   Occupational History  .  [employer]   Social History Main Topics  . Smoking status: Former Games developermoker  . Smokeless tobacco: None     Comment: Only about 2 packs cigarettes in lifetime  . Alcohol Use: No  . Drug Use: No  . Sexual Activity: No   Other Topics Concern  . None   Social History Narrative  . None    Family History  Problem Relation Age of Onset  . Heart disease Mother     congestive heart failure  . Diabetes Father   . Hypertension Father   . Cancer Brother     brain cancer  . Hypertension Brother   . Colon cancer Neg Hx       Medication List       This list is accurate as of: 10/04/13 10:05 AM.  Always use your most recent med list.               NON FORMULARY  Goody powders only occasionally     polyethylene glycol  powder powder  Commonly known as:  GLYCOLAX/MIRALAX  Take 1 capful twice daily for 3 days, then once daily as needed for constipation     polyethylene glycol-electrolytes 420 G solution  Commonly known as:  TRILYTE  Take 4,000 mLs by mouth as directed.        Review of Systems  Review of Systems  Constitutional: Negative for fever, chills, weight loss, malaise/fatigue and diaphoresis.  HENT: Negative for hearing loss, ear pain, nosebleeds, congestion, sore throat, neck pain, tinnitus and ear discharge.   Eyes: Negative for blurred vision, double vision, photophobia, pain, discharge and redness.  Respiratory: Negative for cough, hemoptysis, sputum production, shortness of breath, wheezing and stridor.   Cardiovascular: Negative for chest pain, palpitations, orthopnea, claudication, leg swelling and PND.  Gastrointestinal: negative for abdominal pain. Negative for heartburn, nausea, vomiting, diarrhea, constipation, blood in stool and melena.  Genitourinary: Negative for dysuria, urgency, frequency, hematuria and flank pain.  Musculoskeletal: Negative for myalgias, back pain, joint pain and falls.  Skin: Negative for itching and rash.  Neurological: Negative for  dizziness, tingling, tremors, sensory change, speech change, focal weakness, seizures, loss of consciousness, weakness and headaches.  Endo/Heme/Allergies: Negative for environmental allergies and polydipsia. Does not bruise/bleed easily.  Psychiatric/Behavioral: Negative for depression, suicidal ideas, hallucinations, memory loss and substance abuse. The patient is not nervous/anxious and does not have insomnia.        Objective:    Physical Exam  Vitals reviewed. Constitutional: She is oriented to person, place, and time. She appears well-developed and well-nourished.  HENT:  Head: Normocephalic and atraumatic.        Right Ear: External ear normal.  Left Ear: External ear normal.  Nose: Nose normal.  Mouth/Throat:  Oropharynx is clear and moist.  Eyes: Conjunctivae and EOM are normal. Pupils are equal, round, and reactive to light. Right eye exhibits no discharge. Left eye exhibits no discharge. No scleral icterus.  Neck: Normal range of motion. Neck supple. No tracheal deviation present. No thyromegaly present.  Cardiovascular: Normal rate, regular rhythm, normal heart sounds and intact distal pulses.  Exam reveals no gallop and no friction rub.   No murmur heard. Respiratory: Effort normal and breath sounds normal. No respiratory distress. She has no wheezes. She has no rales. She exhibits no tenderness.  GI: Soft. Bowel sounds are normal. She exhibits no distension and no mass. There is no tenderness. There is no rebound and no guarding.  Genitourinary:  Breasts no masses skin changes or nipple changes bilaterally      Vulva is normal without lesions Vagina is pink moist without discharge Cervix normal in appearance and pap is done Uterus is normal size shape and contour Adnexa is negative with normal sized ovaries   Musculoskeletal: Normal range of motion. She exhibits no edema and no tenderness.  Neurological: She is alert and oriented to person, place, and time. She has normal reflexes. She displays normal reflexes. No cranial nerve deficit. She exhibits normal muscle tone. Coordination normal.  Skin: Skin is warm and dry. No rash noted. No erythema. No pallor.  Psychiatric: She has a normal mood and affect. Her behavior is normal. Judgment and thought content normal.       Assessment:    Healthy female exam.    Plan:    Follow up in: 1 year.

## 2013-10-10 ENCOUNTER — Telehealth: Payer: Self-pay | Admitting: Obstetrics & Gynecology

## 2013-10-10 NOTE — Telephone Encounter (Signed)
Pt aware of results 

## 2013-10-25 ENCOUNTER — Encounter (INDEPENDENT_AMBULATORY_CARE_PROVIDER_SITE_OTHER): Payer: Self-pay

## 2013-10-25 ENCOUNTER — Ambulatory Visit
Admission: RE | Admit: 2013-10-25 | Discharge: 2013-10-25 | Disposition: A | Discharge status: Home / Self Care | Payer: BC Managed Care – PPO | Source: Ambulatory Visit

## 2013-10-25 DIAGNOSIS — Z1231 Encounter for screening mammogram for malignant neoplasm of breast: Secondary | ICD-10-CM

## 2013-12-22 ENCOUNTER — Other Ambulatory Visit: Payer: Self-pay | Admitting: Gastroenterology

## 2014-04-03 ENCOUNTER — Other Ambulatory Visit (HOSPITAL_COMMUNITY): Payer: Self-pay | Admitting: Internal Medicine

## 2014-04-03 DIAGNOSIS — R0989 Other specified symptoms and signs involving the circulatory and respiratory systems: Secondary | ICD-10-CM

## 2014-04-03 DIAGNOSIS — R109 Unspecified abdominal pain: Secondary | ICD-10-CM

## 2014-04-06 ENCOUNTER — Ambulatory Visit (HOSPITAL_COMMUNITY)
Admission: RE | Admit: 2014-04-06 | Discharge: 2014-04-06 | Disposition: A | Discharge status: Home / Self Care | Payer: BC Managed Care – PPO | Source: Ambulatory Visit | Attending: Internal Medicine | Admitting: Internal Medicine

## 2014-04-06 DIAGNOSIS — R109 Unspecified abdominal pain: Secondary | ICD-10-CM | POA: Diagnosis not present

## 2014-04-06 DIAGNOSIS — R0989 Other specified symptoms and signs involving the circulatory and respiratory systems: Secondary | ICD-10-CM | POA: Diagnosis not present

## 2014-04-06 DIAGNOSIS — Z87891 Personal history of nicotine dependence: Secondary | ICD-10-CM | POA: Diagnosis not present

## 2014-04-24 ENCOUNTER — Encounter: Payer: Self-pay | Admitting: Obstetrics & Gynecology

## 2014-09-22 ENCOUNTER — Other Ambulatory Visit: Payer: Self-pay

## 2014-09-22 DIAGNOSIS — Z1231 Encounter for screening mammogram for malignant neoplasm of breast: Secondary | ICD-10-CM

## 2014-10-10 ENCOUNTER — Other Ambulatory Visit: Payer: Self-pay | Admitting: Obstetrics & Gynecology

## 2014-10-17 ENCOUNTER — Other Ambulatory Visit: Payer: Self-pay | Admitting: Obstetrics & Gynecology

## 2014-10-20 ENCOUNTER — Ambulatory Visit (INDEPENDENT_AMBULATORY_CARE_PROVIDER_SITE_OTHER): Payer: BLUE CROSS/BLUE SHIELD | Admitting: Obstetrics & Gynecology

## 2014-10-20 ENCOUNTER — Encounter: Payer: Self-pay | Admitting: Obstetrics & Gynecology

## 2014-10-20 ENCOUNTER — Other Ambulatory Visit (HOSPITAL_COMMUNITY)
Admission: RE | Admit: 2014-10-20 | Discharge: 2014-10-20 | Disposition: A | Discharge status: Home / Self Care | Payer: BLUE CROSS/BLUE SHIELD | Source: Ambulatory Visit | Attending: Obstetrics & Gynecology | Admitting: Obstetrics & Gynecology

## 2014-10-20 VITALS — BP 108/62 | HR 60 | Ht 64.25 in | Wt 128.0 lb

## 2014-10-20 DIAGNOSIS — Z1211 Encounter for screening for malignant neoplasm of colon: Secondary | ICD-10-CM

## 2014-10-20 DIAGNOSIS — Z1212 Encounter for screening for malignant neoplasm of rectum: Secondary | ICD-10-CM

## 2014-10-20 DIAGNOSIS — Z01419 Encounter for gynecological examination (general) (routine) without abnormal findings: Secondary | ICD-10-CM

## 2014-10-20 LAB — HEMOCCULT GUIAC POC 1CARD (OFFICE): FECAL OCCULT BLD: NEGATIVE

## 2014-10-20 NOTE — Progress Notes (Signed)
Patient ID: Jasmine Fox, female   DOB: [birth year], 54 y.o.   MRN: [id number] Subjective:     Jasmine Fox is a 54 y.o. female here for a routine exam.  No LMP recorded. Patient is postmenopausal. G1P1 Birth Control Method:  Post menopausal Menstrual Calendar(currently): none  Current complaints: constipation.   Current acute medical issues:  no   Recent Gynecologic History No LMP recorded. Patient is postmenopausal. Last Pap: 2015,  normal Last mammogram: 2015,  normal  Past Medical History  Diagnosis Date  . Migraine     none since 2013  . Urolithiasis   . Constipation     Past Surgical History  Procedure Laterality Date  . Lt bunion surgery    . Esophagogastroduodenoscopy  02/18/2002    WUJ:WJXBJY esophagus/s/p 54 [place] Maloney dilator  . Kidney stone surgery    . Colonoscopy N/A 11/10/2012    Procedure: COLONOSCOPY;  Surgeon: Corbin Ade, MD;  Location: [facility];  Service: Endoscopy;  Laterality: N/A;  11:15    OB History    Gravida Para Term Preterm AB TAB SAB Ectopic Multiple Living   1 1              History   Social History  . Marital Status: Married    Spouse Name: N/A  . Number of Children: 0  . Years of Education: N/A   Occupational History  .  [employer]   Social History Main Topics  . Smoking status: Former Games developer  . Smokeless tobacco: Never Used     Comment: Only about 2 packs cigarettes in lifetime  . Alcohol Use: No  . Drug Use: No  . Sexual Activity: Not Currently   Other Topics Concern  . None   Social History Narrative    Family History  Problem Relation Age of Onset  . Heart disease Mother     congestive heart failure  . Diabetes Father   . Hypertension Father   . Cancer Brother     brain cancer  . Hypertension Brother   . Colon cancer Neg Hx      Current outpatient prescriptions:  .  polyethylene glycol powder (GLYCOLAX/MIRALAX) powder, TAKE 1 CAPFUL TWICE DAILY FOR 3 DAYS, THEN ONCE DAILY AS NEEDED FOR  CONSTIPATION, Disp: 527 g, Rfl: 5 .  NON FORMULARY, Goody powders only occasionally, Disp: , Rfl:  .  polyethylene glycol-electrolytes (TRILYTE) 420 G solution, Take 4,000 mLs by mouth as directed. (Patient not taking: Reported on 10/20/2014), Disp: 4000 mL, Rfl: 0  Review of Systems  Review of Systems  Constitutional: Negative for fever, chills, weight loss, malaise/fatigue and diaphoresis.  HENT: Negative for hearing loss, ear pain, nosebleeds, congestion, sore throat, neck pain, tinnitus and ear discharge.   Eyes: Negative for blurred vision, double vision, photophobia, pain, discharge and redness.  Respiratory: Negative for cough, hemoptysis, sputum production, shortness of breath, wheezing and stridor.   Cardiovascular: Negative for chest pain, palpitations, orthopnea, claudication, leg swelling and PND.  Gastrointestinal: negative for abdominal pain. Negative for heartburn, nausea, vomiting, diarrhea, constipation, blood in stool and melena.  Genitourinary: Negative for dysuria, urgency, frequency, hematuria and flank pain.  Musculoskeletal: Negative for myalgias, back pain, joint pain and falls.  Skin: Negative for itching and rash.  Neurological: Negative for dizziness, tingling, tremors, sensory change, speech change, focal weakness, seizures, loss of consciousness, weakness and headaches.  Endo/Heme/Allergies: Negative for environmental allergies and polydipsia. Does not bruise/bleed easily.  Psychiatric/Behavioral: Negative for depression, suicidal ideas,  hallucinations, memory loss and substance abuse. The patient is not nervous/anxious and does not have insomnia.        Objective:  Blood pressure 108/62, pulse 60, height 5' 4.25" (1.632 m), weight 128 lb (58.06 kg).   Physical Exam  Vitals reviewed. Constitutional: She is oriented to person, place, and time. She appears well-developed and well-nourished.  HENT:  Head: Normocephalic and atraumatic.        Right Ear: External  ear normal.  Left Ear: External ear normal.  Nose: Nose normal.  Mouth/Throat: Oropharynx is clear and moist.  Eyes: Conjunctivae and EOM are normal. Pupils are equal, round, and reactive to light. Right eye exhibits no discharge. Left eye exhibits no discharge. No scleral icterus.  Neck: Normal range of motion. Neck supple. No tracheal deviation present. No thyromegaly present.  Cardiovascular: Normal rate, regular rhythm, normal heart sounds and intact distal pulses.  Exam reveals no gallop and no friction rub.   No murmur heard. Respiratory: Effort normal and breath sounds normal. No respiratory distress. She has no wheezes. She has no rales. She exhibits no tenderness.  GI: Soft. Bowel sounds are normal. She exhibits no distension and no mass. There is no tenderness. There is no rebound and no guarding.  Genitourinary:  Breasts no masses skin changes or nipple changes bilaterally      Vulva is normal without lesions Vagina is pink moist without discharge Cervix normal in appearance and pap is done Uterus is normal size shape and contour Adnexa is negative with normal sized ovaries  {Rectal    hemoccult negative, normal tone, no masses  Musculoskeletal: Normal range of motion. She exhibits no edema and no tenderness.  Neurological: She is alert and oriented to person, place, and time. She has normal reflexes. She displays normal reflexes. No cranial nerve deficit. She exhibits normal muscle tone. Coordination normal.  Skin: Skin is warm and dry. No rash noted. No erythema. No pallor.  Psychiatric: She has a normal mood and affect. Her behavior is normal. Judgment and thought content normal.       Assessment:    Healthy female exam.    Plan:    Mammogram ordered. Follow up in: 1 year.

## 2014-10-23 LAB — CYTOLOGY - PAP

## 2014-10-27 ENCOUNTER — Ambulatory Visit
Admission: RE | Admit: 2014-10-27 | Discharge: 2014-10-27 | Disposition: A | Discharge status: Home / Self Care | Payer: BLUE CROSS/BLUE SHIELD | Source: Ambulatory Visit

## 2014-10-27 ENCOUNTER — Telehealth: Payer: Self-pay | Admitting: Obstetrics and Gynecology

## 2014-10-27 DIAGNOSIS — Z1231 Encounter for screening mammogram for malignant neoplasm of breast: Secondary | ICD-10-CM

## 2014-10-27 NOTE — Telephone Encounter (Signed)
Pt aware of pap results.

## 2014-12-28 ENCOUNTER — Other Ambulatory Visit: Payer: Self-pay

## 2014-12-28 MED ORDER — POLYETHYLENE GLYCOL 3350 17 GM/SCOOP PO POWD: 17.0000 g | Freq: Every day | ORAL | Status: DC

## 2015-08-15 ENCOUNTER — Other Ambulatory Visit: Payer: Self-pay

## 2015-08-16 MED ORDER — POLYETHYLENE GLYCOL 3350 17 GM/SCOOP PO POWD: 17.0000 g | Freq: Every day | ORAL | Status: DC

## 2015-08-16 NOTE — Telephone Encounter (Signed)
Please notify patient I can send in 6 months worth, but we haven't seen her in about 2 years and after that will need a follow-up appointment to check on her symptom progression.

## 2015-09-28 DIAGNOSIS — L821 Other seborrheic keratosis: Secondary | ICD-10-CM | POA: Diagnosis not present

## 2015-09-28 DIAGNOSIS — D1801 Hemangioma of skin and subcutaneous tissue: Secondary | ICD-10-CM | POA: Diagnosis not present

## 2015-09-28 DIAGNOSIS — L814 Other melanin hyperpigmentation: Secondary | ICD-10-CM | POA: Diagnosis not present

## 2015-10-01 ENCOUNTER — Other Ambulatory Visit: Payer: Self-pay

## 2015-10-01 DIAGNOSIS — Z1231 Encounter for screening mammogram for malignant neoplasm of breast: Secondary | ICD-10-CM

## 2015-10-29 ENCOUNTER — Ambulatory Visit (INDEPENDENT_AMBULATORY_CARE_PROVIDER_SITE_OTHER): Payer: BLUE CROSS/BLUE SHIELD | Admitting: Obstetrics & Gynecology

## 2015-10-29 ENCOUNTER — Other Ambulatory Visit (HOSPITAL_COMMUNITY)
Admission: RE | Admit: 2015-10-29 | Discharge: 2015-10-29 | Disposition: A | Discharge status: Home / Self Care | Payer: BLUE CROSS/BLUE SHIELD | Source: Ambulatory Visit | Attending: Obstetrics & Gynecology | Admitting: Obstetrics & Gynecology

## 2015-10-29 ENCOUNTER — Encounter: Payer: Self-pay | Admitting: Obstetrics & Gynecology

## 2015-10-29 VITALS — BP 110/70 | HR 60 | Ht 64.0 in | Wt 120.0 lb

## 2015-10-29 DIAGNOSIS — Z01419 Encounter for gynecological examination (general) (routine) without abnormal findings: Secondary | ICD-10-CM

## 2015-10-29 DIAGNOSIS — Z1212 Encounter for screening for malignant neoplasm of rectum: Secondary | ICD-10-CM | POA: Diagnosis not present

## 2015-10-29 DIAGNOSIS — Z1211 Encounter for screening for malignant neoplasm of colon: Secondary | ICD-10-CM

## 2015-10-29 NOTE — Progress Notes (Signed)
Patient ID: Jasmine Fox, female   DOB: [birth year], 55 y.o.   MRN: [id number] Subjective:     Jasmine Fox is a 55 y.o. female here for a routine exam.  No LMP recorded. Patient is postmenopausal. G1P1 Birth Control Method:  menopausal Menstrual Calendar(currently): none  Current complaints: none.   Current acute medical issues:  none   Recent Gynecologic History No LMP recorded. Patient is postmenopausal. Last Pap: 09/2014,  normal Last mammogram: 10/2014,  normal  Past Medical History  Diagnosis Date  . Migraine     none since 2013  . Urolithiasis   . Constipation     Past Surgical History  Procedure Laterality Date  . Lt bunion surgery    . Esophagogastroduodenoscopy  02/18/2002    WUJ:WJXBJY esophagus/s/p 54 [place] Maloney dilator  . Kidney stone surgery    . Colonoscopy N/A 11/10/2012    Procedure: COLONOSCOPY;  Surgeon: Corbin Ade, MD;  Location: [facility];  Service: Endoscopy;  Laterality: N/A;  11:15    OB History    Gravida Para Term Preterm AB TAB SAB Ectopic Multiple Living   1 1              Social History   Social History  . Marital Status: Married    Spouse Name: N/A  . Number of Children: 0  . Years of Education: N/A   Occupational History  .  [employer]   Social History Main Topics  . Smoking status: Former Games developer  . Smokeless tobacco: Never Used     Comment: Only about 2 packs cigarettes in lifetime  . Alcohol Use: No  . Drug Use: No  . Sexual Activity: Not Currently   Other Topics Concern  . None   Social History Narrative    Family History  Problem Relation Age of Onset  . Heart disease Mother     congestive heart failure  . Diabetes Father   . Hypertension Father   . Cancer Brother     brain cancer  . Hypertension Brother   . Colon cancer Neg Hx      Current outpatient prescriptions:  .  polyethylene glycol-electrolytes (TRILYTE) 420 G solution, Take 4,000 mLs by mouth as directed., Disp: 4000 mL, Rfl: 0 .  NON  FORMULARY, Goody powders only occasionally, Disp: , Rfl:   Review of Systems  Review of Systems  Constitutional: Negative for fever, chills, weight loss, malaise/fatigue and diaphoresis.  HENT: Negative for hearing loss, ear pain, nosebleeds, congestion, sore throat, neck pain, tinnitus and ear discharge.   Eyes: Negative for blurred vision, double vision, photophobia, pain, discharge and redness.  Respiratory: Negative for cough, hemoptysis, sputum production, shortness of breath, wheezing and stridor.   Cardiovascular: Negative for chest pain, palpitations, orthopnea, claudication, leg swelling and PND.  Gastrointestinal: negative for abdominal pain. Negative for heartburn, nausea, vomiting, diarrhea, constipation, blood in stool and melena.  Genitourinary: Negative for dysuria, urgency, frequency, hematuria and flank pain.  Musculoskeletal: Negative for myalgias, back pain, joint pain and falls.  Skin: Negative for itching and rash.  Neurological: Negative for dizziness, tingling, tremors, sensory change, speech change, focal weakness, seizures, loss of consciousness, weakness and headaches.  Endo/Heme/Allergies: Negative for environmental allergies and polydipsia. Does not bruise/bleed easily.  Psychiatric/Behavioral: Negative for depression, suicidal ideas, hallucinations, memory loss and substance abuse. The patient is not nervous/anxious and does not have insomnia.        Objective:  Blood pressure 110/70, pulse 60, height  (  1.626 m), weight 120 lb (54.432 kg).   Physical Exam  Vitals reviewed. Constitutional: She is oriented to person, place, and time. She appears well-developed and well-nourished.  HENT:  Head: Normocephalic and atraumatic.        Right Ear: External ear normal.  Left Ear: External ear normal.  Nose: Nose normal.  Mouth/Throat: Oropharynx is clear and moist.  Eyes: Conjunctivae and EOM are normal. Pupils are equal, round, and reactive to light. Right eye  exhibits no discharge. Left eye exhibits no discharge. No scleral icterus.  Neck: Normal range of motion. Neck supple. No tracheal deviation present. No thyromegaly present.  Cardiovascular: Normal rate, regular rhythm, normal heart sounds and intact distal pulses.  Exam reveals no gallop and no friction rub.   No murmur heard. Respiratory: Effort normal and breath sounds normal. No respiratory distress. She has no wheezes. She has no rales. She exhibits no tenderness.  GI: Soft. Bowel sounds are normal. She exhibits no distension and no mass. There is no tenderness. There is no rebound and no guarding. +RUQ pain with palpation Genitourinary:  Breasts no masses skin changes or nipple changes bilaterally      Vulva is normal without lesions Vagina is pink moist without discharge Cervix normal in appearance and pap is done Uterus is normal size shape and contour Adnexa is negative with normal sized ovaries  {Rectal    hemoccult negative, normal tone, no masses  Musculoskeletal: Normal range of motion. She exhibits no edema and no tenderness.  Neurological: She is alert and oriented to person, place, and time. She has normal reflexes. She displays normal reflexes. No cranial nerve deficit. She exhibits normal muscle tone. Coordination normal.  Skin: Skin is warm and dry. No rash noted. No erythema. No pallor.  Psychiatric: She has a normal mood and affect. Her behavior is normal. Judgment and thought content normal.       Medications Ordered at today's visit: No orders of the defined types were placed in this encounter.    Other orders placed at today's visit: No orders of the defined types were placed in this encounter.      Assessment:    Healthy female exam.    Plan:    Mammogram ordered.    Follow up prn or 1 year

## 2015-10-30 LAB — CYTOLOGY - PAP

## 2015-10-31 ENCOUNTER — Ambulatory Visit
Admission: RE | Admit: 2015-10-31 | Discharge: 2015-10-31 | Disposition: A | Discharge status: Home / Self Care | Payer: BLUE CROSS/BLUE SHIELD | Source: Ambulatory Visit

## 2015-10-31 DIAGNOSIS — Z1231 Encounter for screening mammogram for malignant neoplasm of breast: Secondary | ICD-10-CM | POA: Diagnosis not present

## 2015-11-01 ENCOUNTER — Other Ambulatory Visit: Payer: Self-pay | Admitting: Internal Medicine

## 2015-11-01 DIAGNOSIS — R928 Other abnormal and inconclusive findings on diagnostic imaging of breast: Secondary | ICD-10-CM

## 2015-11-07 ENCOUNTER — Telehealth: Payer: Self-pay | Admitting: *Deleted

## 2015-11-07 NOTE — Telephone Encounter (Signed)
Pt informed Pap results from 10/29/2015 WNL. Pt verbalized understanding.

## 2015-11-08 ENCOUNTER — Ambulatory Visit
Admission: RE | Admit: 2015-11-08 | Discharge: 2015-11-08 | Disposition: A | Discharge status: Home / Self Care | Payer: BLUE CROSS/BLUE SHIELD | Source: Ambulatory Visit | Attending: Internal Medicine | Admitting: Internal Medicine

## 2015-11-08 DIAGNOSIS — R928 Other abnormal and inconclusive findings on diagnostic imaging of breast: Secondary | ICD-10-CM

## 2015-11-08 DIAGNOSIS — N63 Unspecified lump in breast: Secondary | ICD-10-CM | POA: Diagnosis not present

## 2015-11-29 DIAGNOSIS — M9901 Segmental and somatic dysfunction of cervical region: Secondary | ICD-10-CM | POA: Diagnosis not present

## 2015-11-29 DIAGNOSIS — M5033 Other cervical disc degeneration, cervicothoracic region: Secondary | ICD-10-CM | POA: Diagnosis not present

## 2015-12-03 DIAGNOSIS — M9901 Segmental and somatic dysfunction of cervical region: Secondary | ICD-10-CM | POA: Diagnosis not present

## 2015-12-03 DIAGNOSIS — M5033 Other cervical disc degeneration, cervicothoracic region: Secondary | ICD-10-CM | POA: Diagnosis not present

## 2015-12-05 DIAGNOSIS — M9901 Segmental and somatic dysfunction of cervical region: Secondary | ICD-10-CM | POA: Diagnosis not present

## 2015-12-05 DIAGNOSIS — M5033 Other cervical disc degeneration, cervicothoracic region: Secondary | ICD-10-CM | POA: Diagnosis not present

## 2015-12-06 DIAGNOSIS — M5033 Other cervical disc degeneration, cervicothoracic region: Secondary | ICD-10-CM | POA: Diagnosis not present

## 2015-12-06 DIAGNOSIS — M9901 Segmental and somatic dysfunction of cervical region: Secondary | ICD-10-CM | POA: Diagnosis not present

## 2015-12-10 DIAGNOSIS — M9901 Segmental and somatic dysfunction of cervical region: Secondary | ICD-10-CM | POA: Diagnosis not present

## 2015-12-10 DIAGNOSIS — M5033 Other cervical disc degeneration, cervicothoracic region: Secondary | ICD-10-CM | POA: Diagnosis not present

## 2015-12-12 DIAGNOSIS — M5033 Other cervical disc degeneration, cervicothoracic region: Secondary | ICD-10-CM | POA: Diagnosis not present

## 2015-12-12 DIAGNOSIS — M9901 Segmental and somatic dysfunction of cervical region: Secondary | ICD-10-CM | POA: Diagnosis not present

## 2015-12-13 DIAGNOSIS — M5033 Other cervical disc degeneration, cervicothoracic region: Secondary | ICD-10-CM | POA: Diagnosis not present

## 2015-12-13 DIAGNOSIS — M9901 Segmental and somatic dysfunction of cervical region: Secondary | ICD-10-CM | POA: Diagnosis not present

## 2015-12-17 ENCOUNTER — Other Ambulatory Visit: Payer: Self-pay

## 2015-12-17 DIAGNOSIS — M9901 Segmental and somatic dysfunction of cervical region: Secondary | ICD-10-CM | POA: Diagnosis not present

## 2015-12-17 DIAGNOSIS — M5033 Other cervical disc degeneration, cervicothoracic region: Secondary | ICD-10-CM | POA: Diagnosis not present

## 2015-12-18 DIAGNOSIS — M5033 Other cervical disc degeneration, cervicothoracic region: Secondary | ICD-10-CM | POA: Diagnosis not present

## 2015-12-18 DIAGNOSIS — M9901 Segmental and somatic dysfunction of cervical region: Secondary | ICD-10-CM | POA: Diagnosis not present

## 2015-12-19 DIAGNOSIS — M5033 Other cervical disc degeneration, cervicothoracic region: Secondary | ICD-10-CM | POA: Diagnosis not present

## 2015-12-19 DIAGNOSIS — M9901 Segmental and somatic dysfunction of cervical region: Secondary | ICD-10-CM | POA: Diagnosis not present

## 2015-12-20 DIAGNOSIS — M9901 Segmental and somatic dysfunction of cervical region: Secondary | ICD-10-CM | POA: Diagnosis not present

## 2015-12-20 DIAGNOSIS — M5033 Other cervical disc degeneration, cervicothoracic region: Secondary | ICD-10-CM | POA: Diagnosis not present

## 2015-12-31 DIAGNOSIS — M5033 Other cervical disc degeneration, cervicothoracic region: Secondary | ICD-10-CM | POA: Diagnosis not present

## 2015-12-31 DIAGNOSIS — M9901 Segmental and somatic dysfunction of cervical region: Secondary | ICD-10-CM | POA: Diagnosis not present

## 2016-01-01 DIAGNOSIS — M9901 Segmental and somatic dysfunction of cervical region: Secondary | ICD-10-CM | POA: Diagnosis not present

## 2016-01-01 DIAGNOSIS — M5033 Other cervical disc degeneration, cervicothoracic region: Secondary | ICD-10-CM | POA: Diagnosis not present

## 2016-01-02 DIAGNOSIS — M5033 Other cervical disc degeneration, cervicothoracic region: Secondary | ICD-10-CM | POA: Diagnosis not present

## 2016-01-02 DIAGNOSIS — M9901 Segmental and somatic dysfunction of cervical region: Secondary | ICD-10-CM | POA: Diagnosis not present

## 2016-01-03 DIAGNOSIS — M5033 Other cervical disc degeneration, cervicothoracic region: Secondary | ICD-10-CM | POA: Diagnosis not present

## 2016-01-03 DIAGNOSIS — M9901 Segmental and somatic dysfunction of cervical region: Secondary | ICD-10-CM | POA: Diagnosis not present

## 2016-01-14 DIAGNOSIS — M5033 Other cervical disc degeneration, cervicothoracic region: Secondary | ICD-10-CM | POA: Diagnosis not present

## 2016-01-14 DIAGNOSIS — M9901 Segmental and somatic dysfunction of cervical region: Secondary | ICD-10-CM | POA: Diagnosis not present

## 2016-01-16 DIAGNOSIS — M5033 Other cervical disc degeneration, cervicothoracic region: Secondary | ICD-10-CM | POA: Diagnosis not present

## 2016-01-16 DIAGNOSIS — M9901 Segmental and somatic dysfunction of cervical region: Secondary | ICD-10-CM | POA: Diagnosis not present

## 2016-01-17 DIAGNOSIS — M5033 Other cervical disc degeneration, cervicothoracic region: Secondary | ICD-10-CM | POA: Diagnosis not present

## 2016-01-17 DIAGNOSIS — M9901 Segmental and somatic dysfunction of cervical region: Secondary | ICD-10-CM | POA: Diagnosis not present

## 2016-01-21 DIAGNOSIS — M9901 Segmental and somatic dysfunction of cervical region: Secondary | ICD-10-CM | POA: Diagnosis not present

## 2016-01-21 DIAGNOSIS — M5033 Other cervical disc degeneration, cervicothoracic region: Secondary | ICD-10-CM | POA: Diagnosis not present

## 2016-01-23 DIAGNOSIS — M9901 Segmental and somatic dysfunction of cervical region: Secondary | ICD-10-CM | POA: Diagnosis not present

## 2016-01-23 DIAGNOSIS — M5033 Other cervical disc degeneration, cervicothoracic region: Secondary | ICD-10-CM | POA: Diagnosis not present

## 2016-01-24 DIAGNOSIS — M5033 Other cervical disc degeneration, cervicothoracic region: Secondary | ICD-10-CM | POA: Diagnosis not present

## 2016-01-24 DIAGNOSIS — M9901 Segmental and somatic dysfunction of cervical region: Secondary | ICD-10-CM | POA: Diagnosis not present

## 2016-01-28 DIAGNOSIS — M9901 Segmental and somatic dysfunction of cervical region: Secondary | ICD-10-CM | POA: Diagnosis not present

## 2016-01-28 DIAGNOSIS — M5033 Other cervical disc degeneration, cervicothoracic region: Secondary | ICD-10-CM | POA: Diagnosis not present

## 2016-01-30 DIAGNOSIS — M5033 Other cervical disc degeneration, cervicothoracic region: Secondary | ICD-10-CM | POA: Diagnosis not present

## 2016-01-30 DIAGNOSIS — M9901 Segmental and somatic dysfunction of cervical region: Secondary | ICD-10-CM | POA: Diagnosis not present

## 2016-01-31 DIAGNOSIS — M5033 Other cervical disc degeneration, cervicothoracic region: Secondary | ICD-10-CM | POA: Diagnosis not present

## 2016-01-31 DIAGNOSIS — M9901 Segmental and somatic dysfunction of cervical region: Secondary | ICD-10-CM | POA: Diagnosis not present

## 2016-02-03 DIAGNOSIS — T148 Other injury of unspecified body region: Secondary | ICD-10-CM | POA: Diagnosis not present

## 2016-02-03 DIAGNOSIS — L03039 Cellulitis of unspecified toe: Secondary | ICD-10-CM | POA: Diagnosis not present

## 2016-02-04 DIAGNOSIS — M5033 Other cervical disc degeneration, cervicothoracic region: Secondary | ICD-10-CM | POA: Diagnosis not present

## 2016-02-04 DIAGNOSIS — M9901 Segmental and somatic dysfunction of cervical region: Secondary | ICD-10-CM | POA: Diagnosis not present

## 2016-02-06 DIAGNOSIS — M5033 Other cervical disc degeneration, cervicothoracic region: Secondary | ICD-10-CM | POA: Diagnosis not present

## 2016-02-06 DIAGNOSIS — M9901 Segmental and somatic dysfunction of cervical region: Secondary | ICD-10-CM | POA: Diagnosis not present

## 2016-02-07 DIAGNOSIS — M5033 Other cervical disc degeneration, cervicothoracic region: Secondary | ICD-10-CM | POA: Diagnosis not present

## 2016-02-07 DIAGNOSIS — M9901 Segmental and somatic dysfunction of cervical region: Secondary | ICD-10-CM | POA: Diagnosis not present

## 2016-02-11 DIAGNOSIS — M5033 Other cervical disc degeneration, cervicothoracic region: Secondary | ICD-10-CM | POA: Diagnosis not present

## 2016-02-11 DIAGNOSIS — M9901 Segmental and somatic dysfunction of cervical region: Secondary | ICD-10-CM | POA: Diagnosis not present

## 2016-02-21 DIAGNOSIS — M5033 Other cervical disc degeneration, cervicothoracic region: Secondary | ICD-10-CM | POA: Diagnosis not present

## 2016-02-21 DIAGNOSIS — M9901 Segmental and somatic dysfunction of cervical region: Secondary | ICD-10-CM | POA: Diagnosis not present

## 2016-02-28 DIAGNOSIS — M5033 Other cervical disc degeneration, cervicothoracic region: Secondary | ICD-10-CM | POA: Diagnosis not present

## 2016-02-28 DIAGNOSIS — M9901 Segmental and somatic dysfunction of cervical region: Secondary | ICD-10-CM | POA: Diagnosis not present

## 2016-03-03 DIAGNOSIS — M5033 Other cervical disc degeneration, cervicothoracic region: Secondary | ICD-10-CM | POA: Diagnosis not present

## 2016-03-03 DIAGNOSIS — M9901 Segmental and somatic dysfunction of cervical region: Secondary | ICD-10-CM | POA: Diagnosis not present

## 2016-03-05 DIAGNOSIS — M9901 Segmental and somatic dysfunction of cervical region: Secondary | ICD-10-CM | POA: Diagnosis not present

## 2016-03-05 DIAGNOSIS — M5033 Other cervical disc degeneration, cervicothoracic region: Secondary | ICD-10-CM | POA: Diagnosis not present

## 2016-03-06 DIAGNOSIS — M9901 Segmental and somatic dysfunction of cervical region: Secondary | ICD-10-CM | POA: Diagnosis not present

## 2016-03-06 DIAGNOSIS — M5033 Other cervical disc degeneration, cervicothoracic region: Secondary | ICD-10-CM | POA: Diagnosis not present

## 2016-03-10 DIAGNOSIS — M5033 Other cervical disc degeneration, cervicothoracic region: Secondary | ICD-10-CM | POA: Diagnosis not present

## 2016-03-10 DIAGNOSIS — M9901 Segmental and somatic dysfunction of cervical region: Secondary | ICD-10-CM | POA: Diagnosis not present

## 2016-03-12 DIAGNOSIS — M9901 Segmental and somatic dysfunction of cervical region: Secondary | ICD-10-CM | POA: Diagnosis not present

## 2016-03-12 DIAGNOSIS — M5033 Other cervical disc degeneration, cervicothoracic region: Secondary | ICD-10-CM | POA: Diagnosis not present

## 2016-03-13 DIAGNOSIS — M5033 Other cervical disc degeneration, cervicothoracic region: Secondary | ICD-10-CM | POA: Diagnosis not present

## 2016-03-13 DIAGNOSIS — M9901 Segmental and somatic dysfunction of cervical region: Secondary | ICD-10-CM | POA: Diagnosis not present

## 2016-03-17 DIAGNOSIS — M5033 Other cervical disc degeneration, cervicothoracic region: Secondary | ICD-10-CM | POA: Diagnosis not present

## 2016-03-17 DIAGNOSIS — M9901 Segmental and somatic dysfunction of cervical region: Secondary | ICD-10-CM | POA: Diagnosis not present

## 2016-03-19 DIAGNOSIS — M9901 Segmental and somatic dysfunction of cervical region: Secondary | ICD-10-CM | POA: Diagnosis not present

## 2016-03-19 DIAGNOSIS — M5033 Other cervical disc degeneration, cervicothoracic region: Secondary | ICD-10-CM | POA: Diagnosis not present

## 2016-03-20 DIAGNOSIS — M9901 Segmental and somatic dysfunction of cervical region: Secondary | ICD-10-CM | POA: Diagnosis not present

## 2016-03-20 DIAGNOSIS — M5033 Other cervical disc degeneration, cervicothoracic region: Secondary | ICD-10-CM | POA: Diagnosis not present

## 2016-03-24 DIAGNOSIS — M5033 Other cervical disc degeneration, cervicothoracic region: Secondary | ICD-10-CM | POA: Diagnosis not present

## 2016-03-24 DIAGNOSIS — M9901 Segmental and somatic dysfunction of cervical region: Secondary | ICD-10-CM | POA: Diagnosis not present

## 2016-03-26 DIAGNOSIS — M5033 Other cervical disc degeneration, cervicothoracic region: Secondary | ICD-10-CM | POA: Diagnosis not present

## 2016-03-26 DIAGNOSIS — M9901 Segmental and somatic dysfunction of cervical region: Secondary | ICD-10-CM | POA: Diagnosis not present

## 2016-03-27 DIAGNOSIS — M9901 Segmental and somatic dysfunction of cervical region: Secondary | ICD-10-CM | POA: Diagnosis not present

## 2016-03-27 DIAGNOSIS — M5033 Other cervical disc degeneration, cervicothoracic region: Secondary | ICD-10-CM | POA: Diagnosis not present

## 2016-03-31 DIAGNOSIS — M5033 Other cervical disc degeneration, cervicothoracic region: Secondary | ICD-10-CM | POA: Diagnosis not present

## 2016-03-31 DIAGNOSIS — M9901 Segmental and somatic dysfunction of cervical region: Secondary | ICD-10-CM | POA: Diagnosis not present

## 2016-04-02 ENCOUNTER — Other Ambulatory Visit: Payer: Self-pay | Admitting: Internal Medicine

## 2016-04-02 DIAGNOSIS — M9901 Segmental and somatic dysfunction of cervical region: Secondary | ICD-10-CM | POA: Diagnosis not present

## 2016-04-02 DIAGNOSIS — N6320 Unspecified lump in the left breast, unspecified quadrant: Secondary | ICD-10-CM

## 2016-04-02 DIAGNOSIS — M5033 Other cervical disc degeneration, cervicothoracic region: Secondary | ICD-10-CM | POA: Diagnosis not present

## 2016-04-03 DIAGNOSIS — M5033 Other cervical disc degeneration, cervicothoracic region: Secondary | ICD-10-CM | POA: Diagnosis not present

## 2016-04-03 DIAGNOSIS — M9901 Segmental and somatic dysfunction of cervical region: Secondary | ICD-10-CM | POA: Diagnosis not present

## 2016-04-07 DIAGNOSIS — M5033 Other cervical disc degeneration, cervicothoracic region: Secondary | ICD-10-CM | POA: Diagnosis not present

## 2016-04-07 DIAGNOSIS — M9901 Segmental and somatic dysfunction of cervical region: Secondary | ICD-10-CM | POA: Diagnosis not present

## 2016-04-09 DIAGNOSIS — M9901 Segmental and somatic dysfunction of cervical region: Secondary | ICD-10-CM | POA: Diagnosis not present

## 2016-04-09 DIAGNOSIS — M5033 Other cervical disc degeneration, cervicothoracic region: Secondary | ICD-10-CM | POA: Diagnosis not present

## 2016-04-10 DIAGNOSIS — M9901 Segmental and somatic dysfunction of cervical region: Secondary | ICD-10-CM | POA: Diagnosis not present

## 2016-04-10 DIAGNOSIS — M5033 Other cervical disc degeneration, cervicothoracic region: Secondary | ICD-10-CM | POA: Diagnosis not present

## 2016-04-14 DIAGNOSIS — M9901 Segmental and somatic dysfunction of cervical region: Secondary | ICD-10-CM | POA: Diagnosis not present

## 2016-04-14 DIAGNOSIS — M5033 Other cervical disc degeneration, cervicothoracic region: Secondary | ICD-10-CM | POA: Diagnosis not present

## 2016-04-16 DIAGNOSIS — M9901 Segmental and somatic dysfunction of cervical region: Secondary | ICD-10-CM | POA: Diagnosis not present

## 2016-04-16 DIAGNOSIS — M5033 Other cervical disc degeneration, cervicothoracic region: Secondary | ICD-10-CM | POA: Diagnosis not present

## 2016-04-21 DIAGNOSIS — M5033 Other cervical disc degeneration, cervicothoracic region: Secondary | ICD-10-CM | POA: Diagnosis not present

## 2016-04-21 DIAGNOSIS — M9901 Segmental and somatic dysfunction of cervical region: Secondary | ICD-10-CM | POA: Diagnosis not present

## 2016-04-23 DIAGNOSIS — M9901 Segmental and somatic dysfunction of cervical region: Secondary | ICD-10-CM | POA: Diagnosis not present

## 2016-04-23 DIAGNOSIS — M5033 Other cervical disc degeneration, cervicothoracic region: Secondary | ICD-10-CM | POA: Diagnosis not present

## 2016-04-28 DIAGNOSIS — M9901 Segmental and somatic dysfunction of cervical region: Secondary | ICD-10-CM | POA: Diagnosis not present

## 2016-04-28 DIAGNOSIS — M5033 Other cervical disc degeneration, cervicothoracic region: Secondary | ICD-10-CM | POA: Diagnosis not present

## 2016-04-29 DIAGNOSIS — M9901 Segmental and somatic dysfunction of cervical region: Secondary | ICD-10-CM | POA: Diagnosis not present

## 2016-04-29 DIAGNOSIS — M5033 Other cervical disc degeneration, cervicothoracic region: Secondary | ICD-10-CM | POA: Diagnosis not present

## 2016-05-01 DIAGNOSIS — L570 Actinic keratosis: Secondary | ICD-10-CM | POA: Diagnosis not present

## 2016-05-05 DIAGNOSIS — M9901 Segmental and somatic dysfunction of cervical region: Secondary | ICD-10-CM | POA: Diagnosis not present

## 2016-05-05 DIAGNOSIS — M5033 Other cervical disc degeneration, cervicothoracic region: Secondary | ICD-10-CM | POA: Diagnosis not present

## 2016-05-07 DIAGNOSIS — M9901 Segmental and somatic dysfunction of cervical region: Secondary | ICD-10-CM | POA: Diagnosis not present

## 2016-05-07 DIAGNOSIS — M5033 Other cervical disc degeneration, cervicothoracic region: Secondary | ICD-10-CM | POA: Diagnosis not present

## 2016-05-12 ENCOUNTER — Ambulatory Visit
Admission: RE | Admit: 2016-05-12 | Discharge: 2016-05-12 | Disposition: A | Discharge status: Home / Self Care | Payer: BLUE CROSS/BLUE SHIELD | Source: Ambulatory Visit | Attending: Internal Medicine | Admitting: Internal Medicine

## 2016-05-12 DIAGNOSIS — M9901 Segmental and somatic dysfunction of cervical region: Secondary | ICD-10-CM | POA: Diagnosis not present

## 2016-05-12 DIAGNOSIS — N6320 Unspecified lump in the left breast, unspecified quadrant: Secondary | ICD-10-CM

## 2016-05-12 DIAGNOSIS — R922 Inconclusive mammogram: Secondary | ICD-10-CM | POA: Diagnosis not present

## 2016-05-12 DIAGNOSIS — N6012 Diffuse cystic mastopathy of left breast: Secondary | ICD-10-CM | POA: Diagnosis not present

## 2016-05-12 DIAGNOSIS — M5033 Other cervical disc degeneration, cervicothoracic region: Secondary | ICD-10-CM | POA: Diagnosis not present

## 2016-05-19 DIAGNOSIS — M5033 Other cervical disc degeneration, cervicothoracic region: Secondary | ICD-10-CM | POA: Diagnosis not present

## 2016-05-19 DIAGNOSIS — M9901 Segmental and somatic dysfunction of cervical region: Secondary | ICD-10-CM | POA: Diagnosis not present

## 2016-05-21 DIAGNOSIS — M9901 Segmental and somatic dysfunction of cervical region: Secondary | ICD-10-CM | POA: Diagnosis not present

## 2016-05-21 DIAGNOSIS — M5033 Other cervical disc degeneration, cervicothoracic region: Secondary | ICD-10-CM | POA: Diagnosis not present

## 2016-05-26 DIAGNOSIS — M5033 Other cervical disc degeneration, cervicothoracic region: Secondary | ICD-10-CM | POA: Diagnosis not present

## 2016-05-26 DIAGNOSIS — M9901 Segmental and somatic dysfunction of cervical region: Secondary | ICD-10-CM | POA: Diagnosis not present

## 2016-05-28 DIAGNOSIS — M5033 Other cervical disc degeneration, cervicothoracic region: Secondary | ICD-10-CM | POA: Diagnosis not present

## 2016-05-28 DIAGNOSIS — M9901 Segmental and somatic dysfunction of cervical region: Secondary | ICD-10-CM | POA: Diagnosis not present

## 2016-06-02 DIAGNOSIS — M9901 Segmental and somatic dysfunction of cervical region: Secondary | ICD-10-CM | POA: Diagnosis not present

## 2016-06-02 DIAGNOSIS — M5033 Other cervical disc degeneration, cervicothoracic region: Secondary | ICD-10-CM | POA: Diagnosis not present

## 2016-06-04 DIAGNOSIS — M9901 Segmental and somatic dysfunction of cervical region: Secondary | ICD-10-CM | POA: Diagnosis not present

## 2016-06-04 DIAGNOSIS — M5033 Other cervical disc degeneration, cervicothoracic region: Secondary | ICD-10-CM | POA: Diagnosis not present

## 2016-06-25 DIAGNOSIS — M5033 Other cervical disc degeneration, cervicothoracic region: Secondary | ICD-10-CM | POA: Diagnosis not present

## 2016-06-25 DIAGNOSIS — M9901 Segmental and somatic dysfunction of cervical region: Secondary | ICD-10-CM | POA: Diagnosis not present

## 2016-07-03 DIAGNOSIS — M9901 Segmental and somatic dysfunction of cervical region: Secondary | ICD-10-CM | POA: Diagnosis not present

## 2016-07-03 DIAGNOSIS — M5033 Other cervical disc degeneration, cervicothoracic region: Secondary | ICD-10-CM | POA: Diagnosis not present

## 2016-07-16 DIAGNOSIS — M9901 Segmental and somatic dysfunction of cervical region: Secondary | ICD-10-CM | POA: Diagnosis not present

## 2016-07-16 DIAGNOSIS — M5033 Other cervical disc degeneration, cervicothoracic region: Secondary | ICD-10-CM | POA: Diagnosis not present

## 2016-07-17 DIAGNOSIS — M5033 Other cervical disc degeneration, cervicothoracic region: Secondary | ICD-10-CM | POA: Diagnosis not present

## 2016-07-17 DIAGNOSIS — M9901 Segmental and somatic dysfunction of cervical region: Secondary | ICD-10-CM | POA: Diagnosis not present

## 2016-07-30 DIAGNOSIS — M5033 Other cervical disc degeneration, cervicothoracic region: Secondary | ICD-10-CM | POA: Diagnosis not present

## 2016-07-30 DIAGNOSIS — M9901 Segmental and somatic dysfunction of cervical region: Secondary | ICD-10-CM | POA: Diagnosis not present

## 2016-07-31 DIAGNOSIS — M9901 Segmental and somatic dysfunction of cervical region: Secondary | ICD-10-CM | POA: Diagnosis not present

## 2016-07-31 DIAGNOSIS — M5033 Other cervical disc degeneration, cervicothoracic region: Secondary | ICD-10-CM | POA: Diagnosis not present

## 2016-08-06 DIAGNOSIS — M9901 Segmental and somatic dysfunction of cervical region: Secondary | ICD-10-CM | POA: Diagnosis not present

## 2016-08-06 DIAGNOSIS — M5033 Other cervical disc degeneration, cervicothoracic region: Secondary | ICD-10-CM | POA: Diagnosis not present

## 2016-08-13 DIAGNOSIS — M9901 Segmental and somatic dysfunction of cervical region: Secondary | ICD-10-CM | POA: Diagnosis not present

## 2016-08-13 DIAGNOSIS — M5033 Other cervical disc degeneration, cervicothoracic region: Secondary | ICD-10-CM | POA: Diagnosis not present

## 2016-08-20 DIAGNOSIS — M9901 Segmental and somatic dysfunction of cervical region: Secondary | ICD-10-CM | POA: Diagnosis not present

## 2016-08-20 DIAGNOSIS — M5033 Other cervical disc degeneration, cervicothoracic region: Secondary | ICD-10-CM | POA: Diagnosis not present

## 2016-08-27 DIAGNOSIS — M9901 Segmental and somatic dysfunction of cervical region: Secondary | ICD-10-CM | POA: Diagnosis not present

## 2016-08-27 DIAGNOSIS — M5033 Other cervical disc degeneration, cervicothoracic region: Secondary | ICD-10-CM | POA: Diagnosis not present

## 2016-09-02 DIAGNOSIS — M5033 Other cervical disc degeneration, cervicothoracic region: Secondary | ICD-10-CM | POA: Diagnosis not present

## 2016-09-02 DIAGNOSIS — M9901 Segmental and somatic dysfunction of cervical region: Secondary | ICD-10-CM | POA: Diagnosis not present

## 2016-09-03 DIAGNOSIS — M9901 Segmental and somatic dysfunction of cervical region: Secondary | ICD-10-CM | POA: Diagnosis not present

## 2016-09-03 DIAGNOSIS — M5033 Other cervical disc degeneration, cervicothoracic region: Secondary | ICD-10-CM | POA: Diagnosis not present

## 2016-09-04 DIAGNOSIS — M9901 Segmental and somatic dysfunction of cervical region: Secondary | ICD-10-CM | POA: Diagnosis not present

## 2016-09-04 DIAGNOSIS — M5033 Other cervical disc degeneration, cervicothoracic region: Secondary | ICD-10-CM | POA: Diagnosis not present

## 2016-09-10 DIAGNOSIS — M5033 Other cervical disc degeneration, cervicothoracic region: Secondary | ICD-10-CM | POA: Diagnosis not present

## 2016-09-10 DIAGNOSIS — M9901 Segmental and somatic dysfunction of cervical region: Secondary | ICD-10-CM | POA: Diagnosis not present

## 2016-09-17 DIAGNOSIS — M5033 Other cervical disc degeneration, cervicothoracic region: Secondary | ICD-10-CM | POA: Diagnosis not present

## 2016-09-17 DIAGNOSIS — M9901 Segmental and somatic dysfunction of cervical region: Secondary | ICD-10-CM | POA: Diagnosis not present

## 2016-09-24 DIAGNOSIS — M5033 Other cervical disc degeneration, cervicothoracic region: Secondary | ICD-10-CM | POA: Diagnosis not present

## 2016-09-24 DIAGNOSIS — M9901 Segmental and somatic dysfunction of cervical region: Secondary | ICD-10-CM | POA: Diagnosis not present

## 2016-10-01 DIAGNOSIS — M5033 Other cervical disc degeneration, cervicothoracic region: Secondary | ICD-10-CM | POA: Diagnosis not present

## 2016-10-01 DIAGNOSIS — L280 Lichen simplex chronicus: Secondary | ICD-10-CM | POA: Diagnosis not present

## 2016-10-01 DIAGNOSIS — L821 Other seborrheic keratosis: Secondary | ICD-10-CM | POA: Diagnosis not present

## 2016-10-01 DIAGNOSIS — L281 Prurigo nodularis: Secondary | ICD-10-CM | POA: Diagnosis not present

## 2016-10-01 DIAGNOSIS — D485 Neoplasm of uncertain behavior of skin: Secondary | ICD-10-CM | POA: Diagnosis not present

## 2016-10-01 DIAGNOSIS — L814 Other melanin hyperpigmentation: Secondary | ICD-10-CM | POA: Diagnosis not present

## 2016-10-01 DIAGNOSIS — M9901 Segmental and somatic dysfunction of cervical region: Secondary | ICD-10-CM | POA: Diagnosis not present

## 2016-10-01 DIAGNOSIS — D1801 Hemangioma of skin and subcutaneous tissue: Secondary | ICD-10-CM | POA: Diagnosis not present

## 2016-10-01 DIAGNOSIS — L570 Actinic keratosis: Secondary | ICD-10-CM | POA: Diagnosis not present

## 2016-10-08 DIAGNOSIS — M9901 Segmental and somatic dysfunction of cervical region: Secondary | ICD-10-CM | POA: Diagnosis not present

## 2016-10-08 DIAGNOSIS — M5033 Other cervical disc degeneration, cervicothoracic region: Secondary | ICD-10-CM | POA: Diagnosis not present

## 2016-10-15 DIAGNOSIS — M9901 Segmental and somatic dysfunction of cervical region: Secondary | ICD-10-CM | POA: Diagnosis not present

## 2016-10-15 DIAGNOSIS — M5033 Other cervical disc degeneration, cervicothoracic region: Secondary | ICD-10-CM | POA: Diagnosis not present

## 2016-10-23 DIAGNOSIS — M5033 Other cervical disc degeneration, cervicothoracic region: Secondary | ICD-10-CM | POA: Diagnosis not present

## 2016-10-23 DIAGNOSIS — M9901 Segmental and somatic dysfunction of cervical region: Secondary | ICD-10-CM | POA: Diagnosis not present

## 2016-10-28 DIAGNOSIS — M5033 Other cervical disc degeneration, cervicothoracic region: Secondary | ICD-10-CM | POA: Diagnosis not present

## 2016-10-28 DIAGNOSIS — M9901 Segmental and somatic dysfunction of cervical region: Secondary | ICD-10-CM | POA: Diagnosis not present

## 2016-10-29 DIAGNOSIS — M9901 Segmental and somatic dysfunction of cervical region: Secondary | ICD-10-CM | POA: Diagnosis not present

## 2016-10-29 DIAGNOSIS — M5033 Other cervical disc degeneration, cervicothoracic region: Secondary | ICD-10-CM | POA: Diagnosis not present

## 2016-10-30 ENCOUNTER — Other Ambulatory Visit: Payer: BLUE CROSS/BLUE SHIELD | Admitting: Obstetrics & Gynecology

## 2016-11-03 ENCOUNTER — Other Ambulatory Visit (HOSPITAL_COMMUNITY)
Admission: RE | Admit: 2016-11-03 | Discharge: 2016-11-03 | Disposition: A | Discharge status: Home / Self Care | Payer: BLUE CROSS/BLUE SHIELD | Source: Ambulatory Visit | Attending: Obstetrics & Gynecology | Admitting: Obstetrics & Gynecology

## 2016-11-03 ENCOUNTER — Other Ambulatory Visit: Payer: Self-pay | Admitting: Internal Medicine

## 2016-11-03 ENCOUNTER — Encounter: Payer: Self-pay | Admitting: Obstetrics & Gynecology

## 2016-11-03 ENCOUNTER — Ambulatory Visit (INDEPENDENT_AMBULATORY_CARE_PROVIDER_SITE_OTHER): Payer: BLUE CROSS/BLUE SHIELD | Admitting: Obstetrics & Gynecology

## 2016-11-03 VITALS — BP 128/88 | HR 63 | Ht 65.0 in | Wt 128.0 lb

## 2016-11-03 DIAGNOSIS — N6320 Unspecified lump in the left breast, unspecified quadrant: Secondary | ICD-10-CM

## 2016-11-03 DIAGNOSIS — Z01419 Encounter for gynecological examination (general) (routine) without abnormal findings: Secondary | ICD-10-CM

## 2016-11-03 NOTE — Addendum Note (Signed)
Addended by: Tish FredericksonLANCASTER, Bevan Disney A on: 11/03/2016 11:02 AM   Modules accepted: Orders

## 2016-11-03 NOTE — Progress Notes (Signed)
Subjective:     Jasmine Fox is a 56 y.o. female here for a routine exam.  No LMP recorded. Patient is postmenopausal. G1P1 Birth Control Method:  postmenopausal Menstrual Calendar(currently): amenorrheic  Current complaints: none.   Current acute medical issues:  none   Recent Gynecologic History No LMP recorded. Patient is postmenopausal. Last Pap: 2017,  normal Last mammogram: repeat 6 months,  Left breast  Past Medical History:  Diagnosis Date  . Constipation   . Migraine    none since 2013  . Urolithiasis     Past Surgical History:  Procedure Laterality Date  . COLONOSCOPY N/A 11/10/2012   Procedure: COLONOSCOPY;  Surgeon: Corbin Ade, MD;  Location: [facility];  Service: Endoscopy;  Laterality: N/A;  11:15  . ESOPHAGOGASTRODUODENOSCOPY  02/18/2002   ZOX:WRUEAV esophagus/s/p 54 [place] Maloney dilator  . KIDNEY STONE SURGERY    . lt bunion surgery      OB History    Gravida Para Term Preterm AB Living   1 1           SAB TAB Ectopic Multiple Live Births                  Social History   Social History  . Marital status: Married    Spouse name: N/A  . Number of children: 0  . Years of education: N/A   Occupational History  .  [employer]   Social History Main Topics  . Smoking status: Former Games developer  . Smokeless tobacco: Never Used     Comment: Only about 2 packs cigarettes in lifetime  . Alcohol use No  . Drug use: No  . Sexual activity: Not Currently   Other Topics Concern  . None   Social History Narrative  . None    Family History  Problem Relation Age of Onset  . Heart disease Mother        congestive heart failure  . Diabetes Father   . Hypertension Father   . Cancer Brother        brain cancer  . Hypertension Brother   . Colon cancer Neg Hx      Current Outpatient Prescriptions:  .  polyethylene glycol-electrolytes (TRILYTE) 420 G solution, Take 4,000 mLs by mouth as directed., Disp: 4000 mL, Rfl: 0 .  NON FORMULARY, Goody  powders only occasionally, Disp: , Rfl:   Review of Systems  Review of Systems  Constitutional: Negative for fever, chills, weight loss, malaise/fatigue and diaphoresis.  HENT: Negative for hearing loss, ear pain, nosebleeds, congestion, sore throat, neck pain, tinnitus and ear discharge.   Eyes: Negative for blurred vision, double vision, photophobia, pain, discharge and redness.  Respiratory: Negative for cough, hemoptysis, sputum production, shortness of breath, wheezing and stridor.   Cardiovascular: Negative for chest pain, palpitations, orthopnea, claudication, leg swelling and PND.  Gastrointestinal: negative for abdominal pain. Negative for heartburn, nausea, vomiting, diarrhea, constipation, blood in stool and melena.  Genitourinary: Negative for dysuria, urgency, frequency, hematuria and flank pain.  Musculoskeletal: Negative for myalgias, back pain, joint pain and falls.  Skin: Negative for itching and rash.  Neurological: Negative for dizziness, tingling, tremors, sensory change, speech change, focal weakness, seizures, loss of consciousness, weakness and headaches.  Endo/Heme/Allergies: Negative for environmental allergies and polydipsia. Does not bruise/bleed easily.  Psychiatric/Behavioral: Negative for depression, suicidal ideas, hallucinations, memory loss and substance abuse. The patient is not nervous/anxious and does not have insomnia.        Objective:  Blood pressure 128/88, pulse 63, height 5\' 5"  (1.651 m), weight 128 lb (58.1 kg).   Physical Exam  Vitals reviewed. Constitutional: She is oriented to person, place, and time. She appears well-developed and well-nourished.  HENT:  Head: Normocephalic and atraumatic.        Right Ear: External ear normal.  Left Ear: External ear normal.  Nose: Nose normal.  Mouth/Throat: Oropharynx is clear and moist.  Eyes: Conjunctivae and EOM are normal. Pupils are equal, round, and reactive to light. Right eye exhibits no  discharge. Left eye exhibits no discharge. No scleral icterus.  Neck: Normal range of motion. Neck supple. No tracheal deviation present. No thyromegaly present.  Cardiovascular: Normal rate, regular rhythm, normal heart sounds and intact distal pulses.  Exam reveals no gallop and no friction rub.   No murmur heard. Respiratory: Effort normal and breath sounds normal. No respiratory distress. She has no wheezes. She has no rales. She exhibits no tenderness.  GI: Soft. Bowel sounds are normal. She exhibits no distension and no mass. There is no tenderness. There is no rebound and no guarding.  Genitourinary:  Breasts no masses skin changes or nipple changes bilaterally      Vulva is normal without lesions Vagina is pink moist without discharge Cervix normal in appearance and pap is done Uterus is normal size shape and contour Adnexa is negative with normal sized ovaries   Musculoskeletal: Normal range of motion. She exhibits no edema and no tenderness.  Neurological: She is alert and oriented to person, place, and time. She has normal reflexes. She displays normal reflexes. No cranial nerve deficit. She exhibits normal muscle tone. Coordination normal.  Skin: Skin is warm and dry. No rash noted. No erythema. No pallor.  Psychiatric: She has a normal mood and affect. Her behavior is normal. Judgment and thought content normal.       Medications Ordered at today's visit: No orders of the defined types were placed in this encounter.   Other orders placed at today's visit: No orders of the defined types were placed in this encounter.     Assessment:    Healthy female exam.    Plan:    Mammogram ordered. Follow up in: 2 years.     No Follow-up on file.

## 2016-11-04 LAB — CYTOLOGY - PAP
Diagnosis: NEGATIVE
HPV: NOT DETECTED

## 2016-11-07 ENCOUNTER — Other Ambulatory Visit: Payer: BLUE CROSS/BLUE SHIELD

## 2016-11-12 DIAGNOSIS — M9901 Segmental and somatic dysfunction of cervical region: Secondary | ICD-10-CM | POA: Diagnosis not present

## 2016-11-12 DIAGNOSIS — M5033 Other cervical disc degeneration, cervicothoracic region: Secondary | ICD-10-CM | POA: Diagnosis not present

## 2016-11-14 ENCOUNTER — Ambulatory Visit
Admission: RE | Admit: 2016-11-14 | Discharge: 2016-11-14 | Disposition: A | Discharge status: Home / Self Care | Payer: BLUE CROSS/BLUE SHIELD | Source: Ambulatory Visit | Attending: Internal Medicine | Admitting: Internal Medicine

## 2016-11-14 DIAGNOSIS — N6320 Unspecified lump in the left breast, unspecified quadrant: Secondary | ICD-10-CM

## 2016-11-14 DIAGNOSIS — R928 Other abnormal and inconclusive findings on diagnostic imaging of breast: Secondary | ICD-10-CM | POA: Diagnosis not present

## 2016-11-20 DIAGNOSIS — M9901 Segmental and somatic dysfunction of cervical region: Secondary | ICD-10-CM | POA: Diagnosis not present

## 2016-11-20 DIAGNOSIS — M5033 Other cervical disc degeneration, cervicothoracic region: Secondary | ICD-10-CM | POA: Diagnosis not present

## 2016-11-27 DIAGNOSIS — M9901 Segmental and somatic dysfunction of cervical region: Secondary | ICD-10-CM | POA: Diagnosis not present

## 2016-11-27 DIAGNOSIS — M5033 Other cervical disc degeneration, cervicothoracic region: Secondary | ICD-10-CM | POA: Diagnosis not present

## 2016-12-03 DIAGNOSIS — M5033 Other cervical disc degeneration, cervicothoracic region: Secondary | ICD-10-CM | POA: Diagnosis not present

## 2016-12-03 DIAGNOSIS — M9901 Segmental and somatic dysfunction of cervical region: Secondary | ICD-10-CM | POA: Diagnosis not present

## 2016-12-10 DIAGNOSIS — M5033 Other cervical disc degeneration, cervicothoracic region: Secondary | ICD-10-CM | POA: Diagnosis not present

## 2016-12-10 DIAGNOSIS — M9901 Segmental and somatic dysfunction of cervical region: Secondary | ICD-10-CM | POA: Diagnosis not present

## 2016-12-15 DIAGNOSIS — M5033 Other cervical disc degeneration, cervicothoracic region: Secondary | ICD-10-CM | POA: Diagnosis not present

## 2016-12-15 DIAGNOSIS — M9901 Segmental and somatic dysfunction of cervical region: Secondary | ICD-10-CM | POA: Diagnosis not present

## 2016-12-17 DIAGNOSIS — M9901 Segmental and somatic dysfunction of cervical region: Secondary | ICD-10-CM | POA: Diagnosis not present

## 2016-12-17 DIAGNOSIS — M5033 Other cervical disc degeneration, cervicothoracic region: Secondary | ICD-10-CM | POA: Diagnosis not present

## 2016-12-30 DIAGNOSIS — M9901 Segmental and somatic dysfunction of cervical region: Secondary | ICD-10-CM | POA: Diagnosis not present

## 2016-12-30 DIAGNOSIS — M5033 Other cervical disc degeneration, cervicothoracic region: Secondary | ICD-10-CM | POA: Diagnosis not present

## 2016-12-31 DIAGNOSIS — M9901 Segmental and somatic dysfunction of cervical region: Secondary | ICD-10-CM | POA: Diagnosis not present

## 2016-12-31 DIAGNOSIS — M5033 Other cervical disc degeneration, cervicothoracic region: Secondary | ICD-10-CM | POA: Diagnosis not present

## 2017-01-14 DIAGNOSIS — M9901 Segmental and somatic dysfunction of cervical region: Secondary | ICD-10-CM | POA: Diagnosis not present

## 2017-01-14 DIAGNOSIS — M5033 Other cervical disc degeneration, cervicothoracic region: Secondary | ICD-10-CM | POA: Diagnosis not present

## 2017-01-21 DIAGNOSIS — M5033 Other cervical disc degeneration, cervicothoracic region: Secondary | ICD-10-CM | POA: Diagnosis not present

## 2017-01-21 DIAGNOSIS — M9901 Segmental and somatic dysfunction of cervical region: Secondary | ICD-10-CM | POA: Diagnosis not present

## 2017-01-28 DIAGNOSIS — M9901 Segmental and somatic dysfunction of cervical region: Secondary | ICD-10-CM | POA: Diagnosis not present

## 2017-01-28 DIAGNOSIS — M5033 Other cervical disc degeneration, cervicothoracic region: Secondary | ICD-10-CM | POA: Diagnosis not present

## 2017-02-04 DIAGNOSIS — M5033 Other cervical disc degeneration, cervicothoracic region: Secondary | ICD-10-CM | POA: Diagnosis not present

## 2017-02-04 DIAGNOSIS — M9901 Segmental and somatic dysfunction of cervical region: Secondary | ICD-10-CM | POA: Diagnosis not present

## 2017-02-11 DIAGNOSIS — M5033 Other cervical disc degeneration, cervicothoracic region: Secondary | ICD-10-CM | POA: Diagnosis not present

## 2017-02-11 DIAGNOSIS — M9901 Segmental and somatic dysfunction of cervical region: Secondary | ICD-10-CM | POA: Diagnosis not present

## 2017-02-18 DIAGNOSIS — M5033 Other cervical disc degeneration, cervicothoracic region: Secondary | ICD-10-CM | POA: Diagnosis not present

## 2017-02-18 DIAGNOSIS — M9901 Segmental and somatic dysfunction of cervical region: Secondary | ICD-10-CM | POA: Diagnosis not present

## 2017-02-25 DIAGNOSIS — M9901 Segmental and somatic dysfunction of cervical region: Secondary | ICD-10-CM | POA: Diagnosis not present

## 2017-02-25 DIAGNOSIS — M5033 Other cervical disc degeneration, cervicothoracic region: Secondary | ICD-10-CM | POA: Diagnosis not present

## 2017-02-26 DIAGNOSIS — M5033 Other cervical disc degeneration, cervicothoracic region: Secondary | ICD-10-CM | POA: Diagnosis not present

## 2017-02-26 DIAGNOSIS — M9901 Segmental and somatic dysfunction of cervical region: Secondary | ICD-10-CM | POA: Diagnosis not present

## 2017-03-04 DIAGNOSIS — M5033 Other cervical disc degeneration, cervicothoracic region: Secondary | ICD-10-CM | POA: Diagnosis not present

## 2017-03-04 DIAGNOSIS — M9901 Segmental and somatic dysfunction of cervical region: Secondary | ICD-10-CM | POA: Diagnosis not present

## 2017-03-11 DIAGNOSIS — M9901 Segmental and somatic dysfunction of cervical region: Secondary | ICD-10-CM | POA: Diagnosis not present

## 2017-03-11 DIAGNOSIS — M5033 Other cervical disc degeneration, cervicothoracic region: Secondary | ICD-10-CM | POA: Diagnosis not present

## 2017-03-18 DIAGNOSIS — M9901 Segmental and somatic dysfunction of cervical region: Secondary | ICD-10-CM | POA: Diagnosis not present

## 2017-03-18 DIAGNOSIS — M5033 Other cervical disc degeneration, cervicothoracic region: Secondary | ICD-10-CM | POA: Diagnosis not present

## 2017-03-25 DIAGNOSIS — M9901 Segmental and somatic dysfunction of cervical region: Secondary | ICD-10-CM | POA: Diagnosis not present

## 2017-03-25 DIAGNOSIS — M5033 Other cervical disc degeneration, cervicothoracic region: Secondary | ICD-10-CM | POA: Diagnosis not present

## 2017-04-01 DIAGNOSIS — M9901 Segmental and somatic dysfunction of cervical region: Secondary | ICD-10-CM | POA: Diagnosis not present

## 2017-04-01 DIAGNOSIS — M5033 Other cervical disc degeneration, cervicothoracic region: Secondary | ICD-10-CM | POA: Diagnosis not present

## 2017-04-08 DIAGNOSIS — M9901 Segmental and somatic dysfunction of cervical region: Secondary | ICD-10-CM | POA: Diagnosis not present

## 2017-04-08 DIAGNOSIS — M5033 Other cervical disc degeneration, cervicothoracic region: Secondary | ICD-10-CM | POA: Diagnosis not present

## 2017-04-15 DIAGNOSIS — M9901 Segmental and somatic dysfunction of cervical region: Secondary | ICD-10-CM | POA: Diagnosis not present

## 2017-04-15 DIAGNOSIS — M5033 Other cervical disc degeneration, cervicothoracic region: Secondary | ICD-10-CM | POA: Diagnosis not present

## 2017-04-22 DIAGNOSIS — M5033 Other cervical disc degeneration, cervicothoracic region: Secondary | ICD-10-CM | POA: Diagnosis not present

## 2017-04-22 DIAGNOSIS — M9901 Segmental and somatic dysfunction of cervical region: Secondary | ICD-10-CM | POA: Diagnosis not present

## 2017-04-29 DIAGNOSIS — M9901 Segmental and somatic dysfunction of cervical region: Secondary | ICD-10-CM | POA: Diagnosis not present

## 2017-04-29 DIAGNOSIS — M5033 Other cervical disc degeneration, cervicothoracic region: Secondary | ICD-10-CM | POA: Diagnosis not present

## 2017-05-06 DIAGNOSIS — M9901 Segmental and somatic dysfunction of cervical region: Secondary | ICD-10-CM | POA: Diagnosis not present

## 2017-05-06 DIAGNOSIS — M5033 Other cervical disc degeneration, cervicothoracic region: Secondary | ICD-10-CM | POA: Diagnosis not present

## 2017-05-11 DIAGNOSIS — M9901 Segmental and somatic dysfunction of cervical region: Secondary | ICD-10-CM | POA: Diagnosis not present

## 2017-05-11 DIAGNOSIS — M5033 Other cervical disc degeneration, cervicothoracic region: Secondary | ICD-10-CM | POA: Diagnosis not present

## 2017-05-20 DIAGNOSIS — M5033 Other cervical disc degeneration, cervicothoracic region: Secondary | ICD-10-CM | POA: Diagnosis not present

## 2017-05-20 DIAGNOSIS — M9901 Segmental and somatic dysfunction of cervical region: Secondary | ICD-10-CM | POA: Diagnosis not present

## 2017-05-25 DIAGNOSIS — M9901 Segmental and somatic dysfunction of cervical region: Secondary | ICD-10-CM | POA: Diagnosis not present

## 2017-05-25 DIAGNOSIS — M5033 Other cervical disc degeneration, cervicothoracic region: Secondary | ICD-10-CM | POA: Diagnosis not present

## 2017-06-08 DIAGNOSIS — M5033 Other cervical disc degeneration, cervicothoracic region: Secondary | ICD-10-CM | POA: Diagnosis not present

## 2017-06-08 DIAGNOSIS — M9901 Segmental and somatic dysfunction of cervical region: Secondary | ICD-10-CM | POA: Diagnosis not present

## 2017-06-10 DIAGNOSIS — M5033 Other cervical disc degeneration, cervicothoracic region: Secondary | ICD-10-CM | POA: Diagnosis not present

## 2017-06-10 DIAGNOSIS — M9901 Segmental and somatic dysfunction of cervical region: Secondary | ICD-10-CM | POA: Diagnosis not present

## 2017-06-17 DIAGNOSIS — J019 Acute sinusitis, unspecified: Secondary | ICD-10-CM | POA: Diagnosis not present

## 2017-06-17 DIAGNOSIS — R39198 Other difficulties with micturition: Secondary | ICD-10-CM | POA: Diagnosis not present

## 2017-06-17 DIAGNOSIS — Z6820 Body mass index (BMI) 20.0-20.9, adult: Secondary | ICD-10-CM | POA: Diagnosis not present

## 2017-06-17 DIAGNOSIS — S39012A Strain of muscle, fascia and tendon of lower back, initial encounter: Secondary | ICD-10-CM | POA: Diagnosis not present

## 2017-06-18 DIAGNOSIS — M5033 Other cervical disc degeneration, cervicothoracic region: Secondary | ICD-10-CM | POA: Diagnosis not present

## 2017-06-18 DIAGNOSIS — M9901 Segmental and somatic dysfunction of cervical region: Secondary | ICD-10-CM | POA: Diagnosis not present

## 2017-06-25 DIAGNOSIS — M5033 Other cervical disc degeneration, cervicothoracic region: Secondary | ICD-10-CM | POA: Diagnosis not present

## 2017-06-25 DIAGNOSIS — M9901 Segmental and somatic dysfunction of cervical region: Secondary | ICD-10-CM | POA: Diagnosis not present

## 2017-07-01 DIAGNOSIS — M9901 Segmental and somatic dysfunction of cervical region: Secondary | ICD-10-CM | POA: Diagnosis not present

## 2017-07-01 DIAGNOSIS — M5033 Other cervical disc degeneration, cervicothoracic region: Secondary | ICD-10-CM | POA: Diagnosis not present

## 2017-07-08 DIAGNOSIS — M9901 Segmental and somatic dysfunction of cervical region: Secondary | ICD-10-CM | POA: Diagnosis not present

## 2017-07-08 DIAGNOSIS — M5033 Other cervical disc degeneration, cervicothoracic region: Secondary | ICD-10-CM | POA: Diagnosis not present

## 2017-07-15 DIAGNOSIS — M9901 Segmental and somatic dysfunction of cervical region: Secondary | ICD-10-CM | POA: Diagnosis not present

## 2017-07-15 DIAGNOSIS — M5033 Other cervical disc degeneration, cervicothoracic region: Secondary | ICD-10-CM | POA: Diagnosis not present

## 2017-07-22 DIAGNOSIS — M9901 Segmental and somatic dysfunction of cervical region: Secondary | ICD-10-CM | POA: Diagnosis not present

## 2017-07-22 DIAGNOSIS — M5033 Other cervical disc degeneration, cervicothoracic region: Secondary | ICD-10-CM | POA: Diagnosis not present

## 2017-07-29 DIAGNOSIS — M9901 Segmental and somatic dysfunction of cervical region: Secondary | ICD-10-CM | POA: Diagnosis not present

## 2017-07-29 DIAGNOSIS — M5033 Other cervical disc degeneration, cervicothoracic region: Secondary | ICD-10-CM | POA: Diagnosis not present

## 2017-08-05 DIAGNOSIS — M9901 Segmental and somatic dysfunction of cervical region: Secondary | ICD-10-CM | POA: Diagnosis not present

## 2017-08-05 DIAGNOSIS — M5033 Other cervical disc degeneration, cervicothoracic region: Secondary | ICD-10-CM | POA: Diagnosis not present

## 2017-08-10 DIAGNOSIS — M9901 Segmental and somatic dysfunction of cervical region: Secondary | ICD-10-CM | POA: Diagnosis not present

## 2017-08-10 DIAGNOSIS — M5033 Other cervical disc degeneration, cervicothoracic region: Secondary | ICD-10-CM | POA: Diagnosis not present

## 2017-08-19 DIAGNOSIS — M9901 Segmental and somatic dysfunction of cervical region: Secondary | ICD-10-CM | POA: Diagnosis not present

## 2017-08-19 DIAGNOSIS — M5033 Other cervical disc degeneration, cervicothoracic region: Secondary | ICD-10-CM | POA: Diagnosis not present

## 2017-08-20 DIAGNOSIS — M5033 Other cervical disc degeneration, cervicothoracic region: Secondary | ICD-10-CM | POA: Diagnosis not present

## 2017-08-20 DIAGNOSIS — M9901 Segmental and somatic dysfunction of cervical region: Secondary | ICD-10-CM | POA: Diagnosis not present

## 2017-08-26 DIAGNOSIS — M5033 Other cervical disc degeneration, cervicothoracic region: Secondary | ICD-10-CM | POA: Diagnosis not present

## 2017-08-26 DIAGNOSIS — M9901 Segmental and somatic dysfunction of cervical region: Secondary | ICD-10-CM | POA: Diagnosis not present

## 2017-09-02 DIAGNOSIS — M9901 Segmental and somatic dysfunction of cervical region: Secondary | ICD-10-CM | POA: Diagnosis not present

## 2017-09-02 DIAGNOSIS — M5033 Other cervical disc degeneration, cervicothoracic region: Secondary | ICD-10-CM | POA: Diagnosis not present

## 2017-10-28 DIAGNOSIS — M9901 Segmental and somatic dysfunction of cervical region: Secondary | ICD-10-CM | POA: Diagnosis not present

## 2017-10-28 DIAGNOSIS — M5033 Other cervical disc degeneration, cervicothoracic region: Secondary | ICD-10-CM | POA: Diagnosis not present

## 2017-11-03 DIAGNOSIS — M9901 Segmental and somatic dysfunction of cervical region: Secondary | ICD-10-CM | POA: Diagnosis not present

## 2017-11-03 DIAGNOSIS — M5033 Other cervical disc degeneration, cervicothoracic region: Secondary | ICD-10-CM | POA: Diagnosis not present

## 2017-11-11 DIAGNOSIS — M9901 Segmental and somatic dysfunction of cervical region: Secondary | ICD-10-CM | POA: Diagnosis not present

## 2017-11-11 DIAGNOSIS — M5033 Other cervical disc degeneration, cervicothoracic region: Secondary | ICD-10-CM | POA: Diagnosis not present

## 2017-11-25 DIAGNOSIS — M9901 Segmental and somatic dysfunction of cervical region: Secondary | ICD-10-CM | POA: Diagnosis not present

## 2017-11-25 DIAGNOSIS — M5033 Other cervical disc degeneration, cervicothoracic region: Secondary | ICD-10-CM | POA: Diagnosis not present

## 2017-12-10 DIAGNOSIS — M9901 Segmental and somatic dysfunction of cervical region: Secondary | ICD-10-CM | POA: Diagnosis not present

## 2017-12-10 DIAGNOSIS — M5033 Other cervical disc degeneration, cervicothoracic region: Secondary | ICD-10-CM | POA: Diagnosis not present

## 2017-12-14 DIAGNOSIS — M5033 Other cervical disc degeneration, cervicothoracic region: Secondary | ICD-10-CM | POA: Diagnosis not present

## 2017-12-14 DIAGNOSIS — M9901 Segmental and somatic dysfunction of cervical region: Secondary | ICD-10-CM | POA: Diagnosis not present

## 2017-12-17 DIAGNOSIS — M5033 Other cervical disc degeneration, cervicothoracic region: Secondary | ICD-10-CM | POA: Diagnosis not present

## 2017-12-17 DIAGNOSIS — M9901 Segmental and somatic dysfunction of cervical region: Secondary | ICD-10-CM | POA: Diagnosis not present

## 2017-12-29 DIAGNOSIS — M5033 Other cervical disc degeneration, cervicothoracic region: Secondary | ICD-10-CM | POA: Diagnosis not present

## 2017-12-29 DIAGNOSIS — M9901 Segmental and somatic dysfunction of cervical region: Secondary | ICD-10-CM | POA: Diagnosis not present

## 2017-12-31 DIAGNOSIS — M5033 Other cervical disc degeneration, cervicothoracic region: Secondary | ICD-10-CM | POA: Diagnosis not present

## 2017-12-31 DIAGNOSIS — M9901 Segmental and somatic dysfunction of cervical region: Secondary | ICD-10-CM | POA: Diagnosis not present

## 2018-01-13 DIAGNOSIS — M5033 Other cervical disc degeneration, cervicothoracic region: Secondary | ICD-10-CM | POA: Diagnosis not present

## 2018-01-13 DIAGNOSIS — M9901 Segmental and somatic dysfunction of cervical region: Secondary | ICD-10-CM | POA: Diagnosis not present

## 2018-01-26 DIAGNOSIS — M5033 Other cervical disc degeneration, cervicothoracic region: Secondary | ICD-10-CM | POA: Diagnosis not present

## 2018-01-26 DIAGNOSIS — M9901 Segmental and somatic dysfunction of cervical region: Secondary | ICD-10-CM | POA: Diagnosis not present

## 2018-02-02 DIAGNOSIS — M5033 Other cervical disc degeneration, cervicothoracic region: Secondary | ICD-10-CM | POA: Diagnosis not present

## 2018-02-02 DIAGNOSIS — M9901 Segmental and somatic dysfunction of cervical region: Secondary | ICD-10-CM | POA: Diagnosis not present

## 2018-02-09 DIAGNOSIS — M9901 Segmental and somatic dysfunction of cervical region: Secondary | ICD-10-CM | POA: Diagnosis not present

## 2018-02-09 DIAGNOSIS — M5033 Other cervical disc degeneration, cervicothoracic region: Secondary | ICD-10-CM | POA: Diagnosis not present

## 2018-02-16 DIAGNOSIS — M9901 Segmental and somatic dysfunction of cervical region: Secondary | ICD-10-CM | POA: Diagnosis not present

## 2018-02-16 DIAGNOSIS — M5033 Other cervical disc degeneration, cervicothoracic region: Secondary | ICD-10-CM | POA: Diagnosis not present

## 2018-02-24 DIAGNOSIS — M9901 Segmental and somatic dysfunction of cervical region: Secondary | ICD-10-CM | POA: Diagnosis not present

## 2018-02-24 DIAGNOSIS — M5033 Other cervical disc degeneration, cervicothoracic region: Secondary | ICD-10-CM | POA: Diagnosis not present

## 2018-03-03 DIAGNOSIS — M5033 Other cervical disc degeneration, cervicothoracic region: Secondary | ICD-10-CM | POA: Diagnosis not present

## 2018-03-03 DIAGNOSIS — M9901 Segmental and somatic dysfunction of cervical region: Secondary | ICD-10-CM | POA: Diagnosis not present

## 2018-03-10 DIAGNOSIS — M9901 Segmental and somatic dysfunction of cervical region: Secondary | ICD-10-CM | POA: Diagnosis not present

## 2018-03-10 DIAGNOSIS — M5033 Other cervical disc degeneration, cervicothoracic region: Secondary | ICD-10-CM | POA: Diagnosis not present

## 2018-03-16 DIAGNOSIS — M5033 Other cervical disc degeneration, cervicothoracic region: Secondary | ICD-10-CM | POA: Diagnosis not present

## 2018-03-16 DIAGNOSIS — M9901 Segmental and somatic dysfunction of cervical region: Secondary | ICD-10-CM | POA: Diagnosis not present

## 2018-03-17 DIAGNOSIS — M9901 Segmental and somatic dysfunction of cervical region: Secondary | ICD-10-CM | POA: Diagnosis not present

## 2018-03-17 DIAGNOSIS — M5033 Other cervical disc degeneration, cervicothoracic region: Secondary | ICD-10-CM | POA: Diagnosis not present

## 2018-03-18 DIAGNOSIS — M9901 Segmental and somatic dysfunction of cervical region: Secondary | ICD-10-CM | POA: Diagnosis not present

## 2018-03-18 DIAGNOSIS — M5033 Other cervical disc degeneration, cervicothoracic region: Secondary | ICD-10-CM | POA: Diagnosis not present

## 2018-03-25 DIAGNOSIS — M9901 Segmental and somatic dysfunction of cervical region: Secondary | ICD-10-CM | POA: Diagnosis not present

## 2018-03-25 DIAGNOSIS — M5033 Other cervical disc degeneration, cervicothoracic region: Secondary | ICD-10-CM | POA: Diagnosis not present

## 2018-03-31 DIAGNOSIS — M9901 Segmental and somatic dysfunction of cervical region: Secondary | ICD-10-CM | POA: Diagnosis not present

## 2018-03-31 DIAGNOSIS — M5033 Other cervical disc degeneration, cervicothoracic region: Secondary | ICD-10-CM | POA: Diagnosis not present

## 2018-04-08 DIAGNOSIS — M9901 Segmental and somatic dysfunction of cervical region: Secondary | ICD-10-CM | POA: Diagnosis not present

## 2018-04-08 DIAGNOSIS — M5033 Other cervical disc degeneration, cervicothoracic region: Secondary | ICD-10-CM | POA: Diagnosis not present

## 2018-04-13 DIAGNOSIS — M9901 Segmental and somatic dysfunction of cervical region: Secondary | ICD-10-CM | POA: Diagnosis not present

## 2018-04-13 DIAGNOSIS — M5033 Other cervical disc degeneration, cervicothoracic region: Secondary | ICD-10-CM | POA: Diagnosis not present

## 2018-04-19 DIAGNOSIS — C44519 Basal cell carcinoma of skin of other part of trunk: Secondary | ICD-10-CM | POA: Diagnosis not present

## 2018-04-19 DIAGNOSIS — L570 Actinic keratosis: Secondary | ICD-10-CM | POA: Diagnosis not present

## 2018-04-19 DIAGNOSIS — X32XXXA Exposure to sunlight, initial encounter: Secondary | ICD-10-CM | POA: Diagnosis not present

## 2018-04-19 DIAGNOSIS — Z1283 Encounter for screening for malignant neoplasm of skin: Secondary | ICD-10-CM | POA: Diagnosis not present

## 2018-04-19 DIAGNOSIS — D225 Melanocytic nevi of trunk: Secondary | ICD-10-CM | POA: Diagnosis not present

## 2018-04-21 DIAGNOSIS — M5033 Other cervical disc degeneration, cervicothoracic region: Secondary | ICD-10-CM | POA: Diagnosis not present

## 2018-04-21 DIAGNOSIS — M9901 Segmental and somatic dysfunction of cervical region: Secondary | ICD-10-CM | POA: Diagnosis not present

## 2018-04-28 DIAGNOSIS — M9901 Segmental and somatic dysfunction of cervical region: Secondary | ICD-10-CM | POA: Diagnosis not present

## 2018-04-28 DIAGNOSIS — M5033 Other cervical disc degeneration, cervicothoracic region: Secondary | ICD-10-CM | POA: Diagnosis not present

## 2018-05-04 DIAGNOSIS — Z0000 Encounter for general adult medical examination without abnormal findings: Secondary | ICD-10-CM | POA: Diagnosis not present

## 2018-05-04 DIAGNOSIS — Z1389 Encounter for screening for other disorder: Secondary | ICD-10-CM | POA: Diagnosis not present

## 2018-05-04 DIAGNOSIS — Z6822 Body mass index (BMI) 22.0-22.9, adult: Secondary | ICD-10-CM | POA: Diagnosis not present

## 2018-05-06 DIAGNOSIS — M9901 Segmental and somatic dysfunction of cervical region: Secondary | ICD-10-CM | POA: Diagnosis not present

## 2018-05-06 DIAGNOSIS — M5033 Other cervical disc degeneration, cervicothoracic region: Secondary | ICD-10-CM | POA: Diagnosis not present

## 2018-05-12 DIAGNOSIS — M5033 Other cervical disc degeneration, cervicothoracic region: Secondary | ICD-10-CM | POA: Diagnosis not present

## 2018-05-12 DIAGNOSIS — M9901 Segmental and somatic dysfunction of cervical region: Secondary | ICD-10-CM | POA: Diagnosis not present

## 2018-05-17 DIAGNOSIS — H2512 Age-related nuclear cataract, left eye: Secondary | ICD-10-CM | POA: Diagnosis not present

## 2018-05-17 DIAGNOSIS — H35362 Drusen (degenerative) of macula, left eye: Secondary | ICD-10-CM | POA: Diagnosis not present

## 2018-05-17 DIAGNOSIS — H25013 Cortical age-related cataract, bilateral: Secondary | ICD-10-CM | POA: Diagnosis not present

## 2018-05-17 DIAGNOSIS — H2513 Age-related nuclear cataract, bilateral: Secondary | ICD-10-CM | POA: Diagnosis not present

## 2018-05-18 DIAGNOSIS — M9901 Segmental and somatic dysfunction of cervical region: Secondary | ICD-10-CM | POA: Diagnosis not present

## 2018-05-18 DIAGNOSIS — M5033 Other cervical disc degeneration, cervicothoracic region: Secondary | ICD-10-CM | POA: Diagnosis not present

## 2018-05-26 DIAGNOSIS — M9901 Segmental and somatic dysfunction of cervical region: Secondary | ICD-10-CM | POA: Diagnosis not present

## 2018-05-26 DIAGNOSIS — M5033 Other cervical disc degeneration, cervicothoracic region: Secondary | ICD-10-CM | POA: Diagnosis not present

## 2018-06-02 DIAGNOSIS — M5033 Other cervical disc degeneration, cervicothoracic region: Secondary | ICD-10-CM | POA: Diagnosis not present

## 2018-06-02 DIAGNOSIS — M9901 Segmental and somatic dysfunction of cervical region: Secondary | ICD-10-CM | POA: Diagnosis not present

## 2018-06-09 DIAGNOSIS — M5033 Other cervical disc degeneration, cervicothoracic region: Secondary | ICD-10-CM | POA: Diagnosis not present

## 2018-06-09 DIAGNOSIS — M9901 Segmental and somatic dysfunction of cervical region: Secondary | ICD-10-CM | POA: Diagnosis not present

## 2018-06-21 DIAGNOSIS — M9901 Segmental and somatic dysfunction of cervical region: Secondary | ICD-10-CM | POA: Diagnosis not present

## 2018-06-21 DIAGNOSIS — M5033 Other cervical disc degeneration, cervicothoracic region: Secondary | ICD-10-CM | POA: Diagnosis not present

## 2018-06-24 DIAGNOSIS — M5033 Other cervical disc degeneration, cervicothoracic region: Secondary | ICD-10-CM | POA: Diagnosis not present

## 2018-06-24 DIAGNOSIS — M9901 Segmental and somatic dysfunction of cervical region: Secondary | ICD-10-CM | POA: Diagnosis not present

## 2018-06-28 DIAGNOSIS — L570 Actinic keratosis: Secondary | ICD-10-CM | POA: Diagnosis not present

## 2018-06-28 DIAGNOSIS — C44519 Basal cell carcinoma of skin of other part of trunk: Secondary | ICD-10-CM | POA: Diagnosis not present

## 2018-06-28 DIAGNOSIS — X32XXXD Exposure to sunlight, subsequent encounter: Secondary | ICD-10-CM | POA: Diagnosis not present

## 2018-06-30 DIAGNOSIS — M5033 Other cervical disc degeneration, cervicothoracic region: Secondary | ICD-10-CM | POA: Diagnosis not present

## 2018-06-30 DIAGNOSIS — M9901 Segmental and somatic dysfunction of cervical region: Secondary | ICD-10-CM | POA: Diagnosis not present

## 2018-07-07 DIAGNOSIS — M9901 Segmental and somatic dysfunction of cervical region: Secondary | ICD-10-CM | POA: Diagnosis not present

## 2018-07-07 DIAGNOSIS — M5033 Other cervical disc degeneration, cervicothoracic region: Secondary | ICD-10-CM | POA: Diagnosis not present

## 2018-07-21 DIAGNOSIS — M9901 Segmental and somatic dysfunction of cervical region: Secondary | ICD-10-CM | POA: Diagnosis not present

## 2018-07-21 DIAGNOSIS — M5033 Other cervical disc degeneration, cervicothoracic region: Secondary | ICD-10-CM | POA: Diagnosis not present

## 2018-07-27 DIAGNOSIS — H2512 Age-related nuclear cataract, left eye: Secondary | ICD-10-CM | POA: Diagnosis not present

## 2018-07-27 DIAGNOSIS — H25812 Combined forms of age-related cataract, left eye: Secondary | ICD-10-CM | POA: Diagnosis not present

## 2018-07-27 HISTORY — PX: CATARACT EXTRACTION: SUR2

## 2018-07-29 DIAGNOSIS — M9901 Segmental and somatic dysfunction of cervical region: Secondary | ICD-10-CM | POA: Diagnosis not present

## 2018-07-29 DIAGNOSIS — M5033 Other cervical disc degeneration, cervicothoracic region: Secondary | ICD-10-CM | POA: Diagnosis not present

## 2018-08-02 DIAGNOSIS — H2512 Age-related nuclear cataract, left eye: Secondary | ICD-10-CM | POA: Diagnosis not present

## 2018-08-04 DIAGNOSIS — M5033 Other cervical disc degeneration, cervicothoracic region: Secondary | ICD-10-CM | POA: Diagnosis not present

## 2018-08-04 DIAGNOSIS — M9901 Segmental and somatic dysfunction of cervical region: Secondary | ICD-10-CM | POA: Diagnosis not present

## 2018-08-11 DIAGNOSIS — M9901 Segmental and somatic dysfunction of cervical region: Secondary | ICD-10-CM | POA: Diagnosis not present

## 2018-08-11 DIAGNOSIS — M5033 Other cervical disc degeneration, cervicothoracic region: Secondary | ICD-10-CM | POA: Diagnosis not present

## 2018-08-16 ENCOUNTER — Telehealth: Payer: Self-pay | Admitting: *Deleted

## 2018-08-16 NOTE — Telephone Encounter (Signed)
Pt scheduled appt for future p/p.  No note needed to send to provider.  08-16-2018  AS

## 2018-08-18 DIAGNOSIS — M5033 Other cervical disc degeneration, cervicothoracic region: Secondary | ICD-10-CM | POA: Diagnosis not present

## 2018-08-18 DIAGNOSIS — M9901 Segmental and somatic dysfunction of cervical region: Secondary | ICD-10-CM | POA: Diagnosis not present

## 2018-08-19 ENCOUNTER — Ambulatory Visit (INDEPENDENT_AMBULATORY_CARE_PROVIDER_SITE_OTHER): Payer: BLUE CROSS/BLUE SHIELD | Admitting: Adult Health

## 2018-08-19 ENCOUNTER — Encounter: Payer: Self-pay | Admitting: Adult Health

## 2018-08-19 VITALS — BP 125/78 | HR 92 | Ht 63.0 in | Wt 135.0 lb

## 2018-08-19 DIAGNOSIS — N904 Leukoplakia of vulva: Secondary | ICD-10-CM

## 2018-08-19 MED ORDER — CLOBETASOL PROPIONATE 0.05 % EX OINT: 1.0000 "application " | TOPICAL_OINTMENT | Freq: Two times a day (BID) | CUTANEOUS | 1 refills | Status: DC

## 2018-08-19 NOTE — Progress Notes (Signed)
Patient ID: Jasmine Fox, female   DOB: 1960/10/22, 58 y.o.   MRN: [id number] History of Present Illness: Jasmine Fox is a 58 year old white female, married in complaining of white, itchy spot on vulva area for about 2 months. PCP is Dr Sherwood Gambler.    Current Medications, Allergies, Past Medical History, Past Surgical History, Family History and Social History were reviewed in [employer] [facility] electronic medical record.     Review of Systems: Has white, itchy spot on vulva area for about 2 months  Not currently sexually active.    Physical Exam:BP 125/78 (BP Location: Left Arm, Patient Position: Sitting, Cuff Size: Normal)   Pulse 92   Ht 5\' 3"  (1.6 m)   Wt 135 lb (61.2 kg)   BMI 23.91 kg/m  General:  Well developed, well nourished, no acute distress Skin:  Warm and dry Pelvic:  External genitalia is normal in appearance,but has area at introitus that is white and Thickened ridge, and has skin tag beside it on the right, skin around.  The vagina is pale with loss of moisture and rugae. Urethra has no lesions or masses. The cervix is smooth.  Uterus is felt to be normal size, shape, and contour.  No adnexal masses or tenderness noted.Bladder is non tender, no masses felt. Psych:  No mood changes, alert and cooperative,seems happy Fall risk is low.  Examination chaperoned by Malachy Mood LPN. Discussed that it appears to be lichen sclerosus and that it is chronic skin condition.Showed her pictures of LSA in [facility]. Will rx temovate 0.05% ointment bid till appt 3/30 with Dr Despina Hidden for her pap and physical. Told there is about 3% chance of squamous cell cancer and if not better may get biopsy. Face time 15 minutes with 50% counseling    Impression: 1. Lichen sclerosus et atrophicus of the vulva       Plan: Meds ordered this encounter  Medications  . clobetasol ointment (TEMOVATE) 0.05 %    Sig: Apply 1 application topically 2 (two) times daily.    Dispense:  45 g   Refill:  1    Order Specific Question:   Supervising Provider    Answer:   Lazaro Arms [[id number]]  F/U with Dr Despina Hidden as scheduled Review handout on lichen sclerosus

## 2018-08-19 NOTE — Patient Instructions (Signed)
Lichen Sclerosus  Lichen sclerosus is a skin problem. It can happen on any part of the body, but it commonly involves the anal or genital areas. It can cause itching and discomfort in these areas. Treatment can help to control symptoms. When the genital area is affected, getting treatment is important because the condition can cause scarring that may lead to other problems.  What are the causes?  The cause of this condition is not known. It may be related to an overactive immune system or a lack of certain hormones. Lichen sclerosus is not an infection or a fungus, and it is not passed from one person to another (not contagious).  What increases the risk?  This condition is more likely to develop in women, usually after menopause.  What are the signs or symptoms?  Symptoms of this condition include:   Thin, wrinkled, white areas on the skin.   Thickened white areas on the skin.   Red and swollen patches (lesions) on the skin.   Tears or cracks in the skin.   Bruising.   Blood blisters.   Severe itching.   Pain, itching, or burning when urinating.  Constipation is also common in people with lichen sclerosus.  How is this diagnosed?  This condition may be diagnosed with a physical exam. In some cases, a tissue sample (biopsy sample) may be removed to be looked at under a microscope.  How is this treated?  This condition is usually treated with medicated creams or ointments (topical steroids) that are applied over the affected areas. In some cases, treatment may also include medicines that are taken by mouth. Surgery may be needed in more severe cases that are causing problems such as scarring.  Follow these instructions at home:   Take or use over-the-counter and prescription medicines only as told by your health care provider.   Use creams or ointments as told by your health care provider.   Do not scratch the affected areas of skin.   If you are a woman, be sure to keep the vaginal area as clean and dry  as possible.   Clean the affected area of skin gently with water. Avoid using rough towels or toilet paper.   Keep all follow-up visits as told by your health care provider. This is important.  Contact a health care provider if:   You have increasing redness, swelling, or pain in the affected area.   You have fluid, blood, or pus coming from the affected area.   You have new lesions on your skin.   You have a fever.   You have pain during sex.  Summary   Lichen sclerosus is a skin problem. When the genital area is affected, getting treatment is important because the condition can cause scarring that may lead to other problems.   This condition is usually treated with medicated creams or ointments (topical steroids) that are applied over the affected areas.   Take or use over-the-counter and prescription medicines only as told by your health care provider.   Contact a health care provider if you have new lesions on your skin, have pain during sex, or have increasing redness, swelling, or pain in the affected area.   Keep all follow-up visits as told by your health care provider. This is important.  This information is not intended to replace advice given to you by your health care provider. Make sure you discuss any questions you have with your health care provider.  Document Released:   10/30/2010 Document Revised: 10/22/2017 Document Reviewed: 10/22/2017  Elsevier Interactive Patient Education  2019 [employer]

## 2018-08-23 DIAGNOSIS — H2511 Age-related nuclear cataract, right eye: Secondary | ICD-10-CM | POA: Diagnosis not present

## 2018-08-23 DIAGNOSIS — H25011 Cortical age-related cataract, right eye: Secondary | ICD-10-CM | POA: Diagnosis not present

## 2018-08-25 DIAGNOSIS — M9901 Segmental and somatic dysfunction of cervical region: Secondary | ICD-10-CM | POA: Diagnosis not present

## 2018-08-25 DIAGNOSIS — M5033 Other cervical disc degeneration, cervicothoracic region: Secondary | ICD-10-CM | POA: Diagnosis not present

## 2018-08-31 DIAGNOSIS — H25811 Combined forms of age-related cataract, right eye: Secondary | ICD-10-CM | POA: Diagnosis not present

## 2018-08-31 DIAGNOSIS — H2511 Age-related nuclear cataract, right eye: Secondary | ICD-10-CM | POA: Diagnosis not present

## 2018-09-06 ENCOUNTER — Other Ambulatory Visit: Payer: Self-pay | Admitting: Internal Medicine

## 2018-09-06 DIAGNOSIS — N6320 Unspecified lump in the left breast, unspecified quadrant: Secondary | ICD-10-CM

## 2018-09-10 ENCOUNTER — Other Ambulatory Visit (HOSPITAL_COMMUNITY)
Admission: RE | Admit: 2018-09-10 | Discharge: 2018-09-10 | Disposition: A | Discharge status: Home / Self Care | Payer: BLUE CROSS/BLUE SHIELD | Source: Ambulatory Visit | Attending: Obstetrics & Gynecology | Admitting: Obstetrics & Gynecology

## 2018-09-10 ENCOUNTER — Other Ambulatory Visit: Payer: Self-pay

## 2018-09-10 ENCOUNTER — Encounter: Payer: Self-pay | Admitting: Obstetrics & Gynecology

## 2018-09-10 ENCOUNTER — Ambulatory Visit (INDEPENDENT_AMBULATORY_CARE_PROVIDER_SITE_OTHER): Payer: BLUE CROSS/BLUE SHIELD | Admitting: Obstetrics & Gynecology

## 2018-09-10 VITALS — BP 121/80 | HR 72 | Ht 63.0 in | Wt 131.5 lb

## 2018-09-10 DIAGNOSIS — Z1211 Encounter for screening for malignant neoplasm of colon: Secondary | ICD-10-CM

## 2018-09-10 DIAGNOSIS — Z01419 Encounter for gynecological examination (general) (routine) without abnormal findings: Secondary | ICD-10-CM

## 2018-09-10 DIAGNOSIS — Z1212 Encounter for screening for malignant neoplasm of rectum: Secondary | ICD-10-CM

## 2018-09-10 NOTE — Progress Notes (Signed)
Subjective:     Jasmine Fox is a 58 y.o. female here for a routine exam.  No LMP recorded. Patient is postmenopausal. G1P1 Birth Control Method:  postmenopausal Menstrual Calendar(currently): amenorrhiec no bleeding  Current complaints: none, LSA is quiet currently.   Current acute medical issues:  none  Recent Gynecologic History No LMP recorded. Patient is postmenopausal. Last Pap: 2018,  normal Last mammogram: 10/2016,  normal  Past Medical History:  Diagnosis Date  . Constipation   . Migraine    none since 2013  . Urolithiasis     Past Surgical History:  Procedure Laterality Date  . BREAST BIOPSY Left 08/23/2008  . CATARACT EXTRACTION Left 07/27/2018  . COLONOSCOPY N/A 11/10/2012   Procedure: COLONOSCOPY;  Surgeon: Corbin Ade, MD;  Location: [facility];  Service: Endoscopy;  Laterality: N/A;  11:15  . ESOPHAGOGASTRODUODENOSCOPY  02/18/2002   ZOX:WRUEAV esophagus/s/p 54 French Maloney dilator  . KIDNEY STONE SURGERY    . lt bunion surgery      OB History    Gravida  1   Para  1   Term      Preterm      AB      Living        SAB      TAB      Ectopic      Multiple      Live Births              Social History   Socioeconomic History  . Marital status: Married    Spouse name: Not on file  . Number of children: 0  . Years of education: Not on file  . Highest education level: Not on file  Occupational History    Employer: [employer]  Social Needs  . Financial resource strain: Not on file  . Food insecurity:    Worry: Not on file    Inability: Not on file  . Transportation needs:    Medical: Not on file    Non-medical: Not on file  Tobacco Use  . Smoking status: Former Games developer  . Smokeless tobacco: Never Used  . Tobacco comment: Only about 2 packs cigarettes in lifetime  Substance and Sexual Activity  . Alcohol use: No    Alcohol/week: 0.0 standard drinks  . Drug use: No  . Sexual activity: Not Currently    Birth  control/protection: Post-menopausal  Lifestyle  . Physical activity:    Days per week: Not on file    Minutes per session: Not on file  . Stress: Not on file  Relationships  . Social connections:    Talks on phone: Not on file    Gets together: Not on file    Attends religious service: Not on file    Active member of club or organization: Not on file    Attends meetings of clubs or organizations: Not on file    Relationship status: Not on file  Other Topics Concern  . Not on file  Social History Narrative  . Not on file    Family History  Problem Relation Age of Onset  . Heart disease Mother        congestive heart failure  . Diabetes Father   . Hypertension Father   . Cancer Brother        brain cancer  . Hypertension Brother   . Colon cancer Neg Hx      Current Outpatient Medications:  .  Omega-3 Fatty Acids (FISH  OIL PO), Take by mouth daily., Disp: , Rfl:  .  UNABLE TO FIND, perservision-bid, Disp: , Rfl:  .  clobetasol ointment (TEMOVATE) 0.05 %, Apply 1 application topically 2 (two) times daily. (Patient not taking: Reported on 09/10/2018), Disp: 45 g, Rfl: 1 .  LOTEMAX SM 0.38 % GEL, daily. , Disp: , Rfl:   Review of Systems  Review of Systems  Constitutional: Negative for fever, chills, weight loss, malaise/fatigue and diaphoresis.  HENT: Negative for hearing loss, ear pain, nosebleeds, congestion, sore throat, neck pain, tinnitus and ear discharge.   Eyes: Negative for blurred vision, double vision, photophobia, pain, discharge and redness.  Respiratory: Negative for cough, hemoptysis, sputum production, shortness of breath, wheezing and stridor.   Cardiovascular: Negative for chest pain, palpitations, orthopnea, claudication, leg swelling and PND.  Gastrointestinal: negative for abdominal pain. Negative for heartburn, nausea, vomiting, diarrhea, constipation, blood in stool and melena.  Genitourinary: Negative for dysuria, urgency, frequency, hematuria and  flank pain.  Musculoskeletal: Negative for myalgias, back pain, joint pain and falls.  Skin: Negative for itching and rash.  Neurological: Negative for dizziness, tingling, tremors, sensory change, speech change, focal weakness, seizures, loss of consciousness, weakness and headaches.  Endo/Heme/Allergies: Negative for environmental allergies and polydipsia. Does not bruise/bleed easily.  Psychiatric/Behavioral: Negative for depression, suicidal ideas, hallucinations, memory loss and substance abuse. The patient is not nervous/anxious and does not have insomnia.        Objective:  Blood pressure 121/80, pulse 72, height 5\' 3"  (1.6 m), weight 131 lb 8 oz (59.6 kg).   Physical Exam  Vitals reviewed. Constitutional: She is oriented to person, place, and time. She appears well-developed and well-nourished.  HENT:  Head: Normocephalic and atraumatic.        Right Ear: External ear normal.  Left Ear: External ear normal.  Nose: Nose normal.  Mouth/Throat: Oropharynx is clear and moist.  Eyes: Conjunctivae and EOM are normal. Pupils are equal, round, and reactive to light. Right eye exhibits no discharge. Left eye exhibits no discharge. No scleral icterus.  Neck: Normal range of motion. Neck supple. No tracheal deviation present. No thyromegaly present.  Cardiovascular: Normal rate, regular rhythm, normal heart sounds and intact distal pulses.  Exam reveals no gallop and no friction rub.   No murmur heard. Respiratory: Effort normal and breath sounds normal. No respiratory distress. She has no wheezes. She has no rales. She exhibits no tenderness.  GI: Soft. Bowel sounds are normal. She exhibits no distension and no mass. There is no tenderness. There is no rebound and no guarding.  Genitourinary:  Breasts no masses skin changes or nipple changes bilaterally      Vulva is normal without lesions Vagina is pink moist without discharge Cervix normal in appearance and pap is done Uterus is normal  size shape and contour Adnexa is negative with normal sized ovaries  {Rectal    hemoccult negative, normal tone, no masses  Musculoskeletal: Normal range of motion. She exhibits no edema and no tenderness.  Neurological: She is alert and oriented to person, place, and time. She has normal reflexes. She displays normal reflexes. No cranial nerve deficit. She exhibits normal muscle tone. Coordination normal.  Skin: Skin is warm and dry. No rash noted. No erythema. No pallor.  Psychiatric: She has a normal mood and affect. Her behavior is normal. Judgment and thought content normal.       Medications Ordered at today's visit: No orders of the defined types were placed in this  encounter.   Other orders placed at today's visit: No orders of the defined types were placed in this encounter.     Assessment:    Healthy female exam.   mammogram next week Plan:    Mammogram ordered. Follow up in: 2 years.     Return in about 2 years (around 09/09/2020) for yearly.

## 2018-09-13 LAB — CYTOLOGY - PAP
Diagnosis: NEGATIVE
HPV (WINDOPATH): NOT DETECTED

## 2018-09-17 ENCOUNTER — Ambulatory Visit
Admission: RE | Admit: 2018-09-17 | Discharge: 2018-09-17 | Disposition: A | Discharge status: Home / Self Care | Payer: BLUE CROSS/BLUE SHIELD | Source: Ambulatory Visit | Attending: Internal Medicine | Admitting: Internal Medicine

## 2018-09-17 ENCOUNTER — Other Ambulatory Visit: Payer: Self-pay

## 2018-09-17 DIAGNOSIS — N6320 Unspecified lump in the left breast, unspecified quadrant: Secondary | ICD-10-CM

## 2018-09-22 DIAGNOSIS — M9901 Segmental and somatic dysfunction of cervical region: Secondary | ICD-10-CM | POA: Diagnosis not present

## 2018-09-22 DIAGNOSIS — M5033 Other cervical disc degeneration, cervicothoracic region: Secondary | ICD-10-CM | POA: Diagnosis not present

## 2018-09-29 DIAGNOSIS — M5033 Other cervical disc degeneration, cervicothoracic region: Secondary | ICD-10-CM | POA: Diagnosis not present

## 2018-09-29 DIAGNOSIS — M9901 Segmental and somatic dysfunction of cervical region: Secondary | ICD-10-CM | POA: Diagnosis not present

## 2018-10-06 DIAGNOSIS — M5033 Other cervical disc degeneration, cervicothoracic region: Secondary | ICD-10-CM | POA: Diagnosis not present

## 2018-10-06 DIAGNOSIS — M9901 Segmental and somatic dysfunction of cervical region: Secondary | ICD-10-CM | POA: Diagnosis not present

## 2018-10-13 DIAGNOSIS — M9901 Segmental and somatic dysfunction of cervical region: Secondary | ICD-10-CM | POA: Diagnosis not present

## 2018-10-13 DIAGNOSIS — M5033 Other cervical disc degeneration, cervicothoracic region: Secondary | ICD-10-CM | POA: Diagnosis not present

## 2018-10-17 ENCOUNTER — Encounter: Payer: Self-pay | Admitting: Adult Health

## 2018-10-17 ENCOUNTER — Encounter: Payer: Self-pay | Admitting: Obstetrics & Gynecology

## 2018-10-20 DIAGNOSIS — M5033 Other cervical disc degeneration, cervicothoracic region: Secondary | ICD-10-CM | POA: Diagnosis not present

## 2018-10-20 DIAGNOSIS — M9901 Segmental and somatic dysfunction of cervical region: Secondary | ICD-10-CM | POA: Diagnosis not present

## 2018-10-27 DIAGNOSIS — M9901 Segmental and somatic dysfunction of cervical region: Secondary | ICD-10-CM | POA: Diagnosis not present

## 2018-10-27 DIAGNOSIS — M5033 Other cervical disc degeneration, cervicothoracic region: Secondary | ICD-10-CM | POA: Diagnosis not present

## 2018-11-03 DIAGNOSIS — M9901 Segmental and somatic dysfunction of cervical region: Secondary | ICD-10-CM | POA: Diagnosis not present

## 2018-11-03 DIAGNOSIS — M5033 Other cervical disc degeneration, cervicothoracic region: Secondary | ICD-10-CM | POA: Diagnosis not present

## 2018-11-10 DIAGNOSIS — M5033 Other cervical disc degeneration, cervicothoracic region: Secondary | ICD-10-CM | POA: Diagnosis not present

## 2018-11-10 DIAGNOSIS — M9901 Segmental and somatic dysfunction of cervical region: Secondary | ICD-10-CM | POA: Diagnosis not present

## 2018-11-17 DIAGNOSIS — M9901 Segmental and somatic dysfunction of cervical region: Secondary | ICD-10-CM | POA: Diagnosis not present

## 2018-11-17 DIAGNOSIS — M5033 Other cervical disc degeneration, cervicothoracic region: Secondary | ICD-10-CM | POA: Diagnosis not present

## 2018-11-17 DIAGNOSIS — Z03818 Encounter for observation for suspected exposure to other biological agents ruled out: Secondary | ICD-10-CM | POA: Diagnosis not present

## 2018-11-24 DIAGNOSIS — M9901 Segmental and somatic dysfunction of cervical region: Secondary | ICD-10-CM | POA: Diagnosis not present

## 2018-11-24 DIAGNOSIS — M5033 Other cervical disc degeneration, cervicothoracic region: Secondary | ICD-10-CM | POA: Diagnosis not present

## 2018-11-25 DIAGNOSIS — M9901 Segmental and somatic dysfunction of cervical region: Secondary | ICD-10-CM | POA: Diagnosis not present

## 2018-11-25 DIAGNOSIS — M5033 Other cervical disc degeneration, cervicothoracic region: Secondary | ICD-10-CM | POA: Diagnosis not present

## 2018-12-01 DIAGNOSIS — M5033 Other cervical disc degeneration, cervicothoracic region: Secondary | ICD-10-CM | POA: Diagnosis not present

## 2018-12-01 DIAGNOSIS — M9901 Segmental and somatic dysfunction of cervical region: Secondary | ICD-10-CM | POA: Diagnosis not present

## 2018-12-08 DIAGNOSIS — M9901 Segmental and somatic dysfunction of cervical region: Secondary | ICD-10-CM | POA: Diagnosis not present

## 2018-12-08 DIAGNOSIS — M5033 Other cervical disc degeneration, cervicothoracic region: Secondary | ICD-10-CM | POA: Diagnosis not present

## 2018-12-15 DIAGNOSIS — M5033 Other cervical disc degeneration, cervicothoracic region: Secondary | ICD-10-CM | POA: Diagnosis not present

## 2018-12-15 DIAGNOSIS — M9901 Segmental and somatic dysfunction of cervical region: Secondary | ICD-10-CM | POA: Diagnosis not present

## 2018-12-20 DIAGNOSIS — M9901 Segmental and somatic dysfunction of cervical region: Secondary | ICD-10-CM | POA: Diagnosis not present

## 2018-12-20 DIAGNOSIS — M5033 Other cervical disc degeneration, cervicothoracic region: Secondary | ICD-10-CM | POA: Diagnosis not present

## 2018-12-30 DIAGNOSIS — M9901 Segmental and somatic dysfunction of cervical region: Secondary | ICD-10-CM | POA: Diagnosis not present

## 2018-12-30 DIAGNOSIS — M5033 Other cervical disc degeneration, cervicothoracic region: Secondary | ICD-10-CM | POA: Diagnosis not present

## 2019-01-13 DIAGNOSIS — M5033 Other cervical disc degeneration, cervicothoracic region: Secondary | ICD-10-CM | POA: Diagnosis not present

## 2019-01-13 DIAGNOSIS — M9901 Segmental and somatic dysfunction of cervical region: Secondary | ICD-10-CM | POA: Diagnosis not present

## 2019-01-17 DIAGNOSIS — M9901 Segmental and somatic dysfunction of cervical region: Secondary | ICD-10-CM | POA: Diagnosis not present

## 2019-01-17 DIAGNOSIS — M5033 Other cervical disc degeneration, cervicothoracic region: Secondary | ICD-10-CM | POA: Diagnosis not present

## 2019-01-25 DIAGNOSIS — M5033 Other cervical disc degeneration, cervicothoracic region: Secondary | ICD-10-CM | POA: Diagnosis not present

## 2019-01-25 DIAGNOSIS — M9901 Segmental and somatic dysfunction of cervical region: Secondary | ICD-10-CM | POA: Diagnosis not present

## 2019-02-02 DIAGNOSIS — M9901 Segmental and somatic dysfunction of cervical region: Secondary | ICD-10-CM | POA: Diagnosis not present

## 2019-02-02 DIAGNOSIS — M5033 Other cervical disc degeneration, cervicothoracic region: Secondary | ICD-10-CM | POA: Diagnosis not present

## 2019-02-08 DIAGNOSIS — M5033 Other cervical disc degeneration, cervicothoracic region: Secondary | ICD-10-CM | POA: Diagnosis not present

## 2019-02-08 DIAGNOSIS — M9901 Segmental and somatic dysfunction of cervical region: Secondary | ICD-10-CM | POA: Diagnosis not present

## 2019-02-17 DIAGNOSIS — M9901 Segmental and somatic dysfunction of cervical region: Secondary | ICD-10-CM | POA: Diagnosis not present

## 2019-02-17 DIAGNOSIS — M5033 Other cervical disc degeneration, cervicothoracic region: Secondary | ICD-10-CM | POA: Diagnosis not present

## 2019-02-21 DIAGNOSIS — M9901 Segmental and somatic dysfunction of cervical region: Secondary | ICD-10-CM | POA: Diagnosis not present

## 2019-02-21 DIAGNOSIS — M5033 Other cervical disc degeneration, cervicothoracic region: Secondary | ICD-10-CM | POA: Diagnosis not present

## 2019-03-02 DIAGNOSIS — M5033 Other cervical disc degeneration, cervicothoracic region: Secondary | ICD-10-CM | POA: Diagnosis not present

## 2019-03-02 DIAGNOSIS — M9901 Segmental and somatic dysfunction of cervical region: Secondary | ICD-10-CM | POA: Diagnosis not present

## 2019-03-09 DIAGNOSIS — M9901 Segmental and somatic dysfunction of cervical region: Secondary | ICD-10-CM | POA: Diagnosis not present

## 2019-03-09 DIAGNOSIS — M5033 Other cervical disc degeneration, cervicothoracic region: Secondary | ICD-10-CM | POA: Diagnosis not present

## 2019-03-16 DIAGNOSIS — M5033 Other cervical disc degeneration, cervicothoracic region: Secondary | ICD-10-CM | POA: Diagnosis not present

## 2019-03-16 DIAGNOSIS — M9901 Segmental and somatic dysfunction of cervical region: Secondary | ICD-10-CM | POA: Diagnosis not present

## 2019-03-23 DIAGNOSIS — M5033 Other cervical disc degeneration, cervicothoracic region: Secondary | ICD-10-CM | POA: Diagnosis not present

## 2019-03-23 DIAGNOSIS — M9901 Segmental and somatic dysfunction of cervical region: Secondary | ICD-10-CM | POA: Diagnosis not present

## 2019-03-30 DIAGNOSIS — M9901 Segmental and somatic dysfunction of cervical region: Secondary | ICD-10-CM | POA: Diagnosis not present

## 2019-03-30 DIAGNOSIS — M5033 Other cervical disc degeneration, cervicothoracic region: Secondary | ICD-10-CM | POA: Diagnosis not present

## 2019-04-04 DIAGNOSIS — M5033 Other cervical disc degeneration, cervicothoracic region: Secondary | ICD-10-CM | POA: Diagnosis not present

## 2019-04-04 DIAGNOSIS — M9901 Segmental and somatic dysfunction of cervical region: Secondary | ICD-10-CM | POA: Diagnosis not present

## 2019-04-07 DIAGNOSIS — Z961 Presence of intraocular lens: Secondary | ICD-10-CM | POA: Diagnosis not present

## 2019-04-13 DIAGNOSIS — M9901 Segmental and somatic dysfunction of cervical region: Secondary | ICD-10-CM | POA: Diagnosis not present

## 2019-04-13 DIAGNOSIS — M5033 Other cervical disc degeneration, cervicothoracic region: Secondary | ICD-10-CM | POA: Diagnosis not present

## 2019-04-18 DIAGNOSIS — M79674 Pain in right toe(s): Secondary | ICD-10-CM | POA: Diagnosis not present

## 2019-04-18 DIAGNOSIS — M79671 Pain in right foot: Secondary | ICD-10-CM | POA: Diagnosis not present

## 2019-04-18 DIAGNOSIS — B351 Tinea unguium: Secondary | ICD-10-CM | POA: Diagnosis not present

## 2019-04-18 DIAGNOSIS — M79672 Pain in left foot: Secondary | ICD-10-CM | POA: Diagnosis not present

## 2019-04-18 DIAGNOSIS — M5033 Other cervical disc degeneration, cervicothoracic region: Secondary | ICD-10-CM | POA: Diagnosis not present

## 2019-04-18 DIAGNOSIS — M9901 Segmental and somatic dysfunction of cervical region: Secondary | ICD-10-CM | POA: Diagnosis not present

## 2019-04-27 DIAGNOSIS — M5033 Other cervical disc degeneration, cervicothoracic region: Secondary | ICD-10-CM | POA: Diagnosis not present

## 2019-04-27 DIAGNOSIS — M9901 Segmental and somatic dysfunction of cervical region: Secondary | ICD-10-CM | POA: Diagnosis not present

## 2019-05-02 DIAGNOSIS — H35362 Drusen (degenerative) of macula, left eye: Secondary | ICD-10-CM | POA: Diagnosis not present

## 2019-05-02 DIAGNOSIS — H26493 Other secondary cataract, bilateral: Secondary | ICD-10-CM | POA: Diagnosis not present

## 2019-05-02 DIAGNOSIS — H26492 Other secondary cataract, left eye: Secondary | ICD-10-CM | POA: Diagnosis not present

## 2019-05-02 DIAGNOSIS — H18513 Endothelial corneal dystrophy, bilateral: Secondary | ICD-10-CM | POA: Diagnosis not present

## 2019-05-04 DIAGNOSIS — M5033 Other cervical disc degeneration, cervicothoracic region: Secondary | ICD-10-CM | POA: Diagnosis not present

## 2019-05-04 DIAGNOSIS — M9901 Segmental and somatic dysfunction of cervical region: Secondary | ICD-10-CM | POA: Diagnosis not present

## 2019-05-12 DIAGNOSIS — M5033 Other cervical disc degeneration, cervicothoracic region: Secondary | ICD-10-CM | POA: Diagnosis not present

## 2019-05-12 DIAGNOSIS — M9901 Segmental and somatic dysfunction of cervical region: Secondary | ICD-10-CM | POA: Diagnosis not present

## 2019-05-17 DIAGNOSIS — M9901 Segmental and somatic dysfunction of cervical region: Secondary | ICD-10-CM | POA: Diagnosis not present

## 2019-05-17 DIAGNOSIS — M5033 Other cervical disc degeneration, cervicothoracic region: Secondary | ICD-10-CM | POA: Diagnosis not present

## 2019-05-25 ENCOUNTER — Other Ambulatory Visit: Payer: Self-pay

## 2019-05-25 DIAGNOSIS — Z20822 Contact with and (suspected) exposure to covid-19: Secondary | ICD-10-CM

## 2019-05-26 DIAGNOSIS — M9901 Segmental and somatic dysfunction of cervical region: Secondary | ICD-10-CM | POA: Diagnosis not present

## 2019-05-26 DIAGNOSIS — M5033 Other cervical disc degeneration, cervicothoracic region: Secondary | ICD-10-CM | POA: Diagnosis not present

## 2019-05-28 LAB — NOVEL CORONAVIRUS, NAA: SARS-CoV-2, NAA: NOT DETECTED

## 2019-06-01 DIAGNOSIS — M5033 Other cervical disc degeneration, cervicothoracic region: Secondary | ICD-10-CM | POA: Diagnosis not present

## 2019-06-01 DIAGNOSIS — M9901 Segmental and somatic dysfunction of cervical region: Secondary | ICD-10-CM | POA: Diagnosis not present

## 2019-06-07 DIAGNOSIS — M5033 Other cervical disc degeneration, cervicothoracic region: Secondary | ICD-10-CM | POA: Diagnosis not present

## 2019-06-07 DIAGNOSIS — M9901 Segmental and somatic dysfunction of cervical region: Secondary | ICD-10-CM | POA: Diagnosis not present

## 2019-06-14 DIAGNOSIS — M5033 Other cervical disc degeneration, cervicothoracic region: Secondary | ICD-10-CM | POA: Diagnosis not present

## 2019-06-14 DIAGNOSIS — M9901 Segmental and somatic dysfunction of cervical region: Secondary | ICD-10-CM | POA: Diagnosis not present

## 2019-06-20 DIAGNOSIS — L821 Other seborrheic keratosis: Secondary | ICD-10-CM | POA: Diagnosis not present

## 2019-06-20 DIAGNOSIS — L820 Inflamed seborrheic keratosis: Secondary | ICD-10-CM | POA: Diagnosis not present

## 2019-06-21 DIAGNOSIS — M9901 Segmental and somatic dysfunction of cervical region: Secondary | ICD-10-CM | POA: Diagnosis not present

## 2019-06-21 DIAGNOSIS — M5033 Other cervical disc degeneration, cervicothoracic region: Secondary | ICD-10-CM | POA: Diagnosis not present

## 2019-06-27 DIAGNOSIS — M5033 Other cervical disc degeneration, cervicothoracic region: Secondary | ICD-10-CM | POA: Diagnosis not present

## 2019-06-27 DIAGNOSIS — M9901 Segmental and somatic dysfunction of cervical region: Secondary | ICD-10-CM | POA: Diagnosis not present

## 2019-06-30 DIAGNOSIS — M5033 Other cervical disc degeneration, cervicothoracic region: Secondary | ICD-10-CM | POA: Diagnosis not present

## 2019-06-30 DIAGNOSIS — M9901 Segmental and somatic dysfunction of cervical region: Secondary | ICD-10-CM | POA: Diagnosis not present

## 2019-07-04 ENCOUNTER — Ambulatory Visit: Payer: BC Managed Care – PPO | Attending: Internal Medicine

## 2019-07-04 ENCOUNTER — Other Ambulatory Visit: Payer: Self-pay

## 2019-07-04 DIAGNOSIS — Z20822 Contact with and (suspected) exposure to covid-19: Secondary | ICD-10-CM

## 2019-07-05 ENCOUNTER — Telehealth: Payer: Self-pay | Admitting: *Deleted

## 2019-07-05 NOTE — Telephone Encounter (Signed)
She called in requesting COVID-19 test result.   I let her know it was still pending.

## 2019-07-06 LAB — NOVEL CORONAVIRUS, NAA: SARS-CoV-2, NAA: NOT DETECTED

## 2019-07-07 DIAGNOSIS — M9901 Segmental and somatic dysfunction of cervical region: Secondary | ICD-10-CM | POA: Diagnosis not present

## 2019-07-07 DIAGNOSIS — M5033 Other cervical disc degeneration, cervicothoracic region: Secondary | ICD-10-CM | POA: Diagnosis not present

## 2019-07-13 DIAGNOSIS — M5033 Other cervical disc degeneration, cervicothoracic region: Secondary | ICD-10-CM | POA: Diagnosis not present

## 2019-07-13 DIAGNOSIS — M9901 Segmental and somatic dysfunction of cervical region: Secondary | ICD-10-CM | POA: Diagnosis not present

## 2019-07-21 DIAGNOSIS — M5033 Other cervical disc degeneration, cervicothoracic region: Secondary | ICD-10-CM | POA: Diagnosis not present

## 2019-07-21 DIAGNOSIS — M9901 Segmental and somatic dysfunction of cervical region: Secondary | ICD-10-CM | POA: Diagnosis not present

## 2019-07-27 DIAGNOSIS — M9901 Segmental and somatic dysfunction of cervical region: Secondary | ICD-10-CM | POA: Diagnosis not present

## 2019-07-27 DIAGNOSIS — M5033 Other cervical disc degeneration, cervicothoracic region: Secondary | ICD-10-CM | POA: Diagnosis not present

## 2019-07-28 DIAGNOSIS — M5033 Other cervical disc degeneration, cervicothoracic region: Secondary | ICD-10-CM | POA: Diagnosis not present

## 2019-07-28 DIAGNOSIS — M9901 Segmental and somatic dysfunction of cervical region: Secondary | ICD-10-CM | POA: Diagnosis not present

## 2019-08-10 DIAGNOSIS — M5033 Other cervical disc degeneration, cervicothoracic region: Secondary | ICD-10-CM | POA: Diagnosis not present

## 2019-08-10 DIAGNOSIS — M9901 Segmental and somatic dysfunction of cervical region: Secondary | ICD-10-CM | POA: Diagnosis not present

## 2019-08-25 DIAGNOSIS — M5033 Other cervical disc degeneration, cervicothoracic region: Secondary | ICD-10-CM | POA: Diagnosis not present

## 2019-08-25 DIAGNOSIS — M9901 Segmental and somatic dysfunction of cervical region: Secondary | ICD-10-CM | POA: Diagnosis not present

## 2019-09-14 DIAGNOSIS — M9901 Segmental and somatic dysfunction of cervical region: Secondary | ICD-10-CM | POA: Diagnosis not present

## 2019-09-14 DIAGNOSIS — Z23 Encounter for immunization: Secondary | ICD-10-CM | POA: Diagnosis not present

## 2019-09-14 DIAGNOSIS — M5033 Other cervical disc degeneration, cervicothoracic region: Secondary | ICD-10-CM | POA: Diagnosis not present

## 2019-09-15 DIAGNOSIS — Z23 Encounter for immunization: Secondary | ICD-10-CM | POA: Diagnosis not present

## 2019-10-08 DIAGNOSIS — Z23 Encounter for immunization: Secondary | ICD-10-CM | POA: Diagnosis not present

## 2019-10-11 DIAGNOSIS — Z6820 Body mass index (BMI) 20.0-20.9, adult: Secondary | ICD-10-CM | POA: Diagnosis not present

## 2019-10-11 DIAGNOSIS — F329 Major depressive disorder, single episode, unspecified: Secondary | ICD-10-CM | POA: Diagnosis not present

## 2019-10-11 DIAGNOSIS — Z1389 Encounter for screening for other disorder: Secondary | ICD-10-CM | POA: Diagnosis not present

## 2019-10-12 DIAGNOSIS — M5033 Other cervical disc degeneration, cervicothoracic region: Secondary | ICD-10-CM | POA: Diagnosis not present

## 2019-10-12 DIAGNOSIS — M9901 Segmental and somatic dysfunction of cervical region: Secondary | ICD-10-CM | POA: Diagnosis not present

## 2019-10-26 DIAGNOSIS — M5033 Other cervical disc degeneration, cervicothoracic region: Secondary | ICD-10-CM | POA: Diagnosis not present

## 2019-10-26 DIAGNOSIS — M9901 Segmental and somatic dysfunction of cervical region: Secondary | ICD-10-CM | POA: Diagnosis not present

## 2019-11-02 DIAGNOSIS — M9901 Segmental and somatic dysfunction of cervical region: Secondary | ICD-10-CM | POA: Diagnosis not present

## 2019-11-02 DIAGNOSIS — M5033 Other cervical disc degeneration, cervicothoracic region: Secondary | ICD-10-CM | POA: Diagnosis not present

## 2019-11-15 ENCOUNTER — Encounter: Payer: Self-pay | Admitting: Adult Health

## 2019-11-15 ENCOUNTER — Ambulatory Visit (INDEPENDENT_AMBULATORY_CARE_PROVIDER_SITE_OTHER): Payer: BC Managed Care – PPO | Admitting: Adult Health

## 2019-11-15 VITALS — BP 98/74 | HR 74 | Ht 64.0 in | Wt 120.0 lb

## 2019-11-15 DIAGNOSIS — L0292 Furuncle, unspecified: Secondary | ICD-10-CM | POA: Diagnosis not present

## 2019-11-15 MED ORDER — SULFAMETHOXAZOLE-TRIMETHOPRIM 800-160 MG PO TABS: 1.0000 | ORAL_TABLET | Freq: Two times a day (BID) | ORAL | 0 refills | Status: DC

## 2019-11-15 MED ORDER — SILVER SULFADIAZINE 1 % EX CREA: 1.0000 "application " | TOPICAL_CREAM | Freq: Two times a day (BID) | CUTANEOUS | 1 refills | Status: DC

## 2019-11-15 NOTE — Progress Notes (Signed)
  Subjective:     Patient ID: Jasmine Fox, female   DOB: Oct 04, 1960, 59 y.o.   MRN: [id number]  HPI [name] is a 59 year old white female, married, PM in complaining of boil. PCP is Dr Sherwood Gambler.  Review of Systems Has boil mons pubis, had bump and squeezed it, a little painful  Reviewed past medical,surgical, social and family history. Reviewed medications and allergies.     Objective:   Physical Exam BP 98/74 (BP Location: Right Arm, Patient Position: Sitting, Cuff Size: Normal)   Pulse 74   Ht 5\' 4"  (1.626 m)   Wt 120 lb (54.4 kg)   BMI 20.60 kg/m    Skin warm and dry Has 3 x 6 cm area of redness and 1 cm area looks like scab on mons pubis.  Assessment:     1. Boil  Rx septra ds and silvadene to calm this down and recheck in 2 days, if not resolving may I&D Meds ordered this encounter  Medications  . sulfamethoxazole-trimethoprim (BACTRIM DS) 800-160 MG tablet    Sig: Take 1 tablet by mouth 2 (two) times daily. Take 1 bid    Dispense:  28 tablet    Refill:  0    Order Specific Question:   Supervising Provider    Answer:   EURE, LUTHER H [[id number]]  . silver sulfADIAZINE (SILVADENE) 1 % cream    Sig: Apply 1 application topically 2 (two) times daily.    Dispense:  25 g    Refill:  1    Order Specific Question:   Supervising Provider    Answer:   H [[id number]]  Use warm compresses and do not squeeze     Plan:    Review handout on boil Follow up in 2 days

## 2019-11-15 NOTE — Patient Instructions (Signed)
Skin Abscess  A skin abscess is an infected area on or under your skin that contains a collection of pus and other material. An abscess may also be called a furuncle, carbuncle, or boil. An abscess can occur in or on almost any part of your body. Some abscesses break open (rupture) on their own. Most continue to get worse unless they are treated. The infection can spread deeper into the body and eventually into your blood, which can make you feel ill. Treatment usually involves draining the abscess. What are the causes? An abscess occurs when germs, like bacteria, pass through your skin and cause an infection. This may be caused by:  A scrape or cut on your skin.  A puncture wound through your skin, including a needle injection or insect bite.  Blocked oil or sweat glands.  Blocked and infected hair follicles.  A cyst that forms beneath your skin (sebaceous cyst) and becomes infected. What increases the risk? This condition is more likely to develop in people who:  Have a weak body defense system (immune system).  Have diabetes.  Have dry and irritated skin.  Get frequent injections or use illegal IV drugs.  Have a foreign body in a wound, such as a splinter.  Have problems with their lymph system or veins. What are the signs or symptoms? Symptoms of this condition include:  A painful, firm bump under the skin.  A bump with pus at the top. This may break through the skin and drain. Other symptoms include:  Redness surrounding the abscess site.  Warmth.  Swelling of the lymph nodes (glands) near the abscess.  Tenderness.  A sore on the skin. How is this diagnosed? This condition may be diagnosed based on:  A physical exam.  Your medical history.  A sample of pus. This may be used to find out what is causing the infection.  Blood tests.  Imaging tests, such as an ultrasound, CT scan, or MRI. How is this treated? A small abscess that drains on its own may  not need treatment. Treatment for larger abscesses may include:  Moist heat or heat pack applied to the area several times a day.  A procedure to drain the abscess (incision and drainage).  Antibiotic medicines. For a severe abscess, you may first get antibiotics through an IV and then change to antibiotics by mouth. Follow these instructions at home: Medicines   Take over-the-counter and prescription medicines only as told by your health care provider.  If you were prescribed an antibiotic medicine, take it as told by your health care provider. Do not stop taking the antibiotic even if you start to feel better. Abscess care   If you have an abscess that has not drained, apply heat to the affected area. Use the heat source that your health care provider recommends, such as a moist heat pack or a heating pad. ? Place a towel between your skin and the heat source. ? Leave the heat on for 20-30 minutes. ? Remove the heat if your skin turns bright red. This is especially important if you are unable to feel pain, heat, or cold. You may have a greater risk of getting burned.  Follow instructions from your health care provider about how to take care of your abscess. Make sure you: ? Cover the abscess with a bandage (dressing). ? Change your dressing or gauze as told by your health care provider. ? Wash your hands with soap and water before you change the   dressing or gauze. If soap and water are not available, use hand sanitizer.  Check your abscess every day for signs of a worsening infection. Check for: ? More redness, swelling, or pain. ? More fluid or blood. ? Warmth. ? More pus or a bad smell. General instructions  To avoid spreading the infection: ? Do not share personal care items, towels, or hot tubs with others. ? Avoid making skin contact with other people.  Keep all follow-up visits as told by your health care provider. This is important. Contact a health care provider if  you have:  More redness, swelling, or pain around your abscess.  More fluid or blood coming from your abscess.  Warm skin around your abscess.  More pus or a bad smell coming from your abscess.  A fever.  Muscle aches.  Chills or a general ill feeling. Get help right away if you:  Have severe pain.  See red streaks on your skin spreading away from the abscess. Summary  A skin abscess is an infected area on or under your skin that contains a collection of pus and other material.  A small abscess that drains on its own may not need treatment.  Treatment for larger abscesses may include having a procedure to drain the abscess and taking an antibiotic. This information is not intended to replace advice given to you by your health care provider. Make sure you discuss any questions you have with your health care provider. Document Revised: 09/30/2018 Document Reviewed: 07/23/2017 Elsevier Patient Education  2020 Elsevier Inc.  

## 2019-11-16 DIAGNOSIS — M9901 Segmental and somatic dysfunction of cervical region: Secondary | ICD-10-CM | POA: Diagnosis not present

## 2019-11-16 DIAGNOSIS — M5033 Other cervical disc degeneration, cervicothoracic region: Secondary | ICD-10-CM | POA: Diagnosis not present

## 2019-11-23 ENCOUNTER — Ambulatory Visit: Payer: BC Managed Care – PPO | Admitting: Adult Health

## 2019-11-23 DIAGNOSIS — M9901 Segmental and somatic dysfunction of cervical region: Secondary | ICD-10-CM | POA: Diagnosis not present

## 2019-11-23 DIAGNOSIS — M5033 Other cervical disc degeneration, cervicothoracic region: Secondary | ICD-10-CM | POA: Diagnosis not present

## 2019-11-30 DIAGNOSIS — M5033 Other cervical disc degeneration, cervicothoracic region: Secondary | ICD-10-CM | POA: Diagnosis not present

## 2019-11-30 DIAGNOSIS — M9901 Segmental and somatic dysfunction of cervical region: Secondary | ICD-10-CM | POA: Diagnosis not present

## 2019-12-07 DIAGNOSIS — M9901 Segmental and somatic dysfunction of cervical region: Secondary | ICD-10-CM | POA: Diagnosis not present

## 2019-12-07 DIAGNOSIS — M5033 Other cervical disc degeneration, cervicothoracic region: Secondary | ICD-10-CM | POA: Diagnosis not present

## 2019-12-14 DIAGNOSIS — M9901 Segmental and somatic dysfunction of cervical region: Secondary | ICD-10-CM | POA: Diagnosis not present

## 2019-12-14 DIAGNOSIS — M5033 Other cervical disc degeneration, cervicothoracic region: Secondary | ICD-10-CM | POA: Diagnosis not present

## 2019-12-20 DIAGNOSIS — M9901 Segmental and somatic dysfunction of cervical region: Secondary | ICD-10-CM | POA: Diagnosis not present

## 2019-12-20 DIAGNOSIS — M5033 Other cervical disc degeneration, cervicothoracic region: Secondary | ICD-10-CM | POA: Diagnosis not present

## 2019-12-28 DIAGNOSIS — M5033 Other cervical disc degeneration, cervicothoracic region: Secondary | ICD-10-CM | POA: Diagnosis not present

## 2019-12-28 DIAGNOSIS — M9901 Segmental and somatic dysfunction of cervical region: Secondary | ICD-10-CM | POA: Diagnosis not present

## 2020-01-02 DIAGNOSIS — M9901 Segmental and somatic dysfunction of cervical region: Secondary | ICD-10-CM | POA: Diagnosis not present

## 2020-01-02 DIAGNOSIS — M5033 Other cervical disc degeneration, cervicothoracic region: Secondary | ICD-10-CM | POA: Diagnosis not present

## 2020-01-11 DIAGNOSIS — M5033 Other cervical disc degeneration, cervicothoracic region: Secondary | ICD-10-CM | POA: Diagnosis not present

## 2020-01-11 DIAGNOSIS — M9901 Segmental and somatic dysfunction of cervical region: Secondary | ICD-10-CM | POA: Diagnosis not present

## 2020-01-18 DIAGNOSIS — M5033 Other cervical disc degeneration, cervicothoracic region: Secondary | ICD-10-CM | POA: Diagnosis not present

## 2020-01-18 DIAGNOSIS — M9901 Segmental and somatic dysfunction of cervical region: Secondary | ICD-10-CM | POA: Diagnosis not present

## 2020-01-25 DIAGNOSIS — M9901 Segmental and somatic dysfunction of cervical region: Secondary | ICD-10-CM | POA: Diagnosis not present

## 2020-01-25 DIAGNOSIS — M5033 Other cervical disc degeneration, cervicothoracic region: Secondary | ICD-10-CM | POA: Diagnosis not present

## 2020-02-01 DIAGNOSIS — M5033 Other cervical disc degeneration, cervicothoracic region: Secondary | ICD-10-CM | POA: Diagnosis not present

## 2020-02-01 DIAGNOSIS — M9901 Segmental and somatic dysfunction of cervical region: Secondary | ICD-10-CM | POA: Diagnosis not present

## 2020-02-06 DIAGNOSIS — M5033 Other cervical disc degeneration, cervicothoracic region: Secondary | ICD-10-CM | POA: Diagnosis not present

## 2020-02-06 DIAGNOSIS — M9901 Segmental and somatic dysfunction of cervical region: Secondary | ICD-10-CM | POA: Diagnosis not present

## 2020-02-15 DIAGNOSIS — M9901 Segmental and somatic dysfunction of cervical region: Secondary | ICD-10-CM | POA: Diagnosis not present

## 2020-02-15 DIAGNOSIS — M5033 Other cervical disc degeneration, cervicothoracic region: Secondary | ICD-10-CM | POA: Diagnosis not present

## 2020-02-20 DIAGNOSIS — M9901 Segmental and somatic dysfunction of cervical region: Secondary | ICD-10-CM | POA: Diagnosis not present

## 2020-02-20 DIAGNOSIS — M5033 Other cervical disc degeneration, cervicothoracic region: Secondary | ICD-10-CM | POA: Diagnosis not present

## 2020-03-07 DIAGNOSIS — M5033 Other cervical disc degeneration, cervicothoracic region: Secondary | ICD-10-CM | POA: Diagnosis not present

## 2020-03-07 DIAGNOSIS — M9901 Segmental and somatic dysfunction of cervical region: Secondary | ICD-10-CM | POA: Diagnosis not present

## 2020-03-14 DIAGNOSIS — M9901 Segmental and somatic dysfunction of cervical region: Secondary | ICD-10-CM | POA: Diagnosis not present

## 2020-03-14 DIAGNOSIS — M5033 Other cervical disc degeneration, cervicothoracic region: Secondary | ICD-10-CM | POA: Diagnosis not present

## 2020-03-20 DIAGNOSIS — M9901 Segmental and somatic dysfunction of cervical region: Secondary | ICD-10-CM | POA: Diagnosis not present

## 2020-03-20 DIAGNOSIS — M5033 Other cervical disc degeneration, cervicothoracic region: Secondary | ICD-10-CM | POA: Diagnosis not present

## 2020-03-28 DIAGNOSIS — M9901 Segmental and somatic dysfunction of cervical region: Secondary | ICD-10-CM | POA: Diagnosis not present

## 2020-03-28 DIAGNOSIS — M5033 Other cervical disc degeneration, cervicothoracic region: Secondary | ICD-10-CM | POA: Diagnosis not present

## 2020-04-04 DIAGNOSIS — M9901 Segmental and somatic dysfunction of cervical region: Secondary | ICD-10-CM | POA: Diagnosis not present

## 2020-04-04 DIAGNOSIS — M5033 Other cervical disc degeneration, cervicothoracic region: Secondary | ICD-10-CM | POA: Diagnosis not present

## 2020-04-11 DIAGNOSIS — M5033 Other cervical disc degeneration, cervicothoracic region: Secondary | ICD-10-CM | POA: Diagnosis not present

## 2020-04-11 DIAGNOSIS — M9901 Segmental and somatic dysfunction of cervical region: Secondary | ICD-10-CM | POA: Diagnosis not present

## 2020-04-16 DIAGNOSIS — M9901 Segmental and somatic dysfunction of cervical region: Secondary | ICD-10-CM | POA: Diagnosis not present

## 2020-04-16 DIAGNOSIS — M5033 Other cervical disc degeneration, cervicothoracic region: Secondary | ICD-10-CM | POA: Diagnosis not present

## 2020-04-25 DIAGNOSIS — M5033 Other cervical disc degeneration, cervicothoracic region: Secondary | ICD-10-CM | POA: Diagnosis not present

## 2020-04-25 DIAGNOSIS — M9901 Segmental and somatic dysfunction of cervical region: Secondary | ICD-10-CM | POA: Diagnosis not present

## 2020-05-02 DIAGNOSIS — M9901 Segmental and somatic dysfunction of cervical region: Secondary | ICD-10-CM | POA: Diagnosis not present

## 2020-05-02 DIAGNOSIS — M5033 Other cervical disc degeneration, cervicothoracic region: Secondary | ICD-10-CM | POA: Diagnosis not present

## 2020-05-02 DIAGNOSIS — Z23 Encounter for immunization: Secondary | ICD-10-CM | POA: Diagnosis not present

## 2020-05-09 DIAGNOSIS — M9901 Segmental and somatic dysfunction of cervical region: Secondary | ICD-10-CM | POA: Diagnosis not present

## 2020-05-09 DIAGNOSIS — M5033 Other cervical disc degeneration, cervicothoracic region: Secondary | ICD-10-CM | POA: Diagnosis not present

## 2020-05-14 DIAGNOSIS — M9901 Segmental and somatic dysfunction of cervical region: Secondary | ICD-10-CM | POA: Diagnosis not present

## 2020-05-14 DIAGNOSIS — M5033 Other cervical disc degeneration, cervicothoracic region: Secondary | ICD-10-CM | POA: Diagnosis not present

## 2020-05-23 DIAGNOSIS — M9901 Segmental and somatic dysfunction of cervical region: Secondary | ICD-10-CM | POA: Diagnosis not present

## 2020-05-23 DIAGNOSIS — M5033 Other cervical disc degeneration, cervicothoracic region: Secondary | ICD-10-CM | POA: Diagnosis not present

## 2020-05-30 DIAGNOSIS — M9901 Segmental and somatic dysfunction of cervical region: Secondary | ICD-10-CM | POA: Diagnosis not present

## 2020-05-30 DIAGNOSIS — M5033 Other cervical disc degeneration, cervicothoracic region: Secondary | ICD-10-CM | POA: Diagnosis not present

## 2020-06-06 DIAGNOSIS — M5033 Other cervical disc degeneration, cervicothoracic region: Secondary | ICD-10-CM | POA: Diagnosis not present

## 2020-06-06 DIAGNOSIS — M9901 Segmental and somatic dysfunction of cervical region: Secondary | ICD-10-CM | POA: Diagnosis not present

## 2020-06-11 DIAGNOSIS — M5033 Other cervical disc degeneration, cervicothoracic region: Secondary | ICD-10-CM | POA: Diagnosis not present

## 2020-06-11 DIAGNOSIS — M9901 Segmental and somatic dysfunction of cervical region: Secondary | ICD-10-CM | POA: Diagnosis not present

## 2020-06-20 DIAGNOSIS — M9901 Segmental and somatic dysfunction of cervical region: Secondary | ICD-10-CM | POA: Diagnosis not present

## 2020-06-20 DIAGNOSIS — M5033 Other cervical disc degeneration, cervicothoracic region: Secondary | ICD-10-CM | POA: Diagnosis not present

## 2020-06-21 DIAGNOSIS — H43812 Vitreous degeneration, left eye: Secondary | ICD-10-CM | POA: Diagnosis not present

## 2020-06-25 DIAGNOSIS — H43392 Other vitreous opacities, left eye: Secondary | ICD-10-CM | POA: Diagnosis not present

## 2020-06-25 DIAGNOSIS — H43813 Vitreous degeneration, bilateral: Secondary | ICD-10-CM | POA: Diagnosis not present

## 2020-06-25 DIAGNOSIS — H4312 Vitreous hemorrhage, left eye: Secondary | ICD-10-CM | POA: Diagnosis not present

## 2020-06-25 DIAGNOSIS — H3562 Retinal hemorrhage, left eye: Secondary | ICD-10-CM | POA: Diagnosis not present

## 2020-07-02 DIAGNOSIS — D225 Melanocytic nevi of trunk: Secondary | ICD-10-CM | POA: Diagnosis not present

## 2020-07-02 DIAGNOSIS — X32XXXD Exposure to sunlight, subsequent encounter: Secondary | ICD-10-CM | POA: Diagnosis not present

## 2020-07-02 DIAGNOSIS — Z1283 Encounter for screening for malignant neoplasm of skin: Secondary | ICD-10-CM | POA: Diagnosis not present

## 2020-07-02 DIAGNOSIS — D485 Neoplasm of uncertain behavior of skin: Secondary | ICD-10-CM | POA: Diagnosis not present

## 2020-07-02 DIAGNOSIS — L820 Inflamed seborrheic keratosis: Secondary | ICD-10-CM | POA: Diagnosis not present

## 2020-07-02 DIAGNOSIS — L570 Actinic keratosis: Secondary | ICD-10-CM | POA: Diagnosis not present

## 2020-07-16 DIAGNOSIS — H43813 Vitreous degeneration, bilateral: Secondary | ICD-10-CM | POA: Diagnosis not present

## 2020-07-16 DIAGNOSIS — H3562 Retinal hemorrhage, left eye: Secondary | ICD-10-CM | POA: Diagnosis not present

## 2020-07-18 DIAGNOSIS — M9901 Segmental and somatic dysfunction of cervical region: Secondary | ICD-10-CM | POA: Diagnosis not present

## 2020-07-18 DIAGNOSIS — M5033 Other cervical disc degeneration, cervicothoracic region: Secondary | ICD-10-CM | POA: Diagnosis not present

## 2020-07-25 DIAGNOSIS — M5033 Other cervical disc degeneration, cervicothoracic region: Secondary | ICD-10-CM | POA: Diagnosis not present

## 2020-07-25 DIAGNOSIS — M9901 Segmental and somatic dysfunction of cervical region: Secondary | ICD-10-CM | POA: Diagnosis not present

## 2020-08-01 DIAGNOSIS — M9901 Segmental and somatic dysfunction of cervical region: Secondary | ICD-10-CM | POA: Diagnosis not present

## 2020-08-01 DIAGNOSIS — M5033 Other cervical disc degeneration, cervicothoracic region: Secondary | ICD-10-CM | POA: Diagnosis not present

## 2020-08-06 DIAGNOSIS — M9901 Segmental and somatic dysfunction of cervical region: Secondary | ICD-10-CM | POA: Diagnosis not present

## 2020-08-06 DIAGNOSIS — M5033 Other cervical disc degeneration, cervicothoracic region: Secondary | ICD-10-CM | POA: Diagnosis not present

## 2020-08-15 DIAGNOSIS — M5033 Other cervical disc degeneration, cervicothoracic region: Secondary | ICD-10-CM | POA: Diagnosis not present

## 2020-08-15 DIAGNOSIS — M9901 Segmental and somatic dysfunction of cervical region: Secondary | ICD-10-CM | POA: Diagnosis not present

## 2020-08-20 DIAGNOSIS — M9901 Segmental and somatic dysfunction of cervical region: Secondary | ICD-10-CM | POA: Diagnosis not present

## 2020-08-20 DIAGNOSIS — H43813 Vitreous degeneration, bilateral: Secondary | ICD-10-CM | POA: Diagnosis not present

## 2020-08-20 DIAGNOSIS — M5033 Other cervical disc degeneration, cervicothoracic region: Secondary | ICD-10-CM | POA: Diagnosis not present

## 2020-08-29 DIAGNOSIS — M5033 Other cervical disc degeneration, cervicothoracic region: Secondary | ICD-10-CM | POA: Diagnosis not present

## 2020-08-29 DIAGNOSIS — M9901 Segmental and somatic dysfunction of cervical region: Secondary | ICD-10-CM | POA: Diagnosis not present

## 2020-09-06 DIAGNOSIS — M9901 Segmental and somatic dysfunction of cervical region: Secondary | ICD-10-CM | POA: Diagnosis not present

## 2020-09-06 DIAGNOSIS — M5033 Other cervical disc degeneration, cervicothoracic region: Secondary | ICD-10-CM | POA: Diagnosis not present

## 2020-09-12 DIAGNOSIS — M9901 Segmental and somatic dysfunction of cervical region: Secondary | ICD-10-CM | POA: Diagnosis not present

## 2020-09-12 DIAGNOSIS — M5033 Other cervical disc degeneration, cervicothoracic region: Secondary | ICD-10-CM | POA: Diagnosis not present

## 2020-09-19 DIAGNOSIS — M5033 Other cervical disc degeneration, cervicothoracic region: Secondary | ICD-10-CM | POA: Diagnosis not present

## 2020-09-19 DIAGNOSIS — M9901 Segmental and somatic dysfunction of cervical region: Secondary | ICD-10-CM | POA: Diagnosis not present

## 2020-09-27 DIAGNOSIS — M5033 Other cervical disc degeneration, cervicothoracic region: Secondary | ICD-10-CM | POA: Diagnosis not present

## 2020-09-27 DIAGNOSIS — M9901 Segmental and somatic dysfunction of cervical region: Secondary | ICD-10-CM | POA: Diagnosis not present

## 2020-10-04 DIAGNOSIS — M5033 Other cervical disc degeneration, cervicothoracic region: Secondary | ICD-10-CM | POA: Diagnosis not present

## 2020-10-04 DIAGNOSIS — M9901 Segmental and somatic dysfunction of cervical region: Secondary | ICD-10-CM | POA: Diagnosis not present

## 2020-10-08 DIAGNOSIS — M9901 Segmental and somatic dysfunction of cervical region: Secondary | ICD-10-CM | POA: Diagnosis not present

## 2020-10-08 DIAGNOSIS — M5033 Other cervical disc degeneration, cervicothoracic region: Secondary | ICD-10-CM | POA: Diagnosis not present

## 2020-10-17 DIAGNOSIS — M9901 Segmental and somatic dysfunction of cervical region: Secondary | ICD-10-CM | POA: Diagnosis not present

## 2020-10-17 DIAGNOSIS — M5033 Other cervical disc degeneration, cervicothoracic region: Secondary | ICD-10-CM | POA: Diagnosis not present

## 2020-10-23 DIAGNOSIS — Z0001 Encounter for general adult medical examination with abnormal findings: Secondary | ICD-10-CM | POA: Diagnosis not present

## 2020-10-23 DIAGNOSIS — Z0000 Encounter for general adult medical examination without abnormal findings: Secondary | ICD-10-CM | POA: Diagnosis not present

## 2020-10-23 DIAGNOSIS — K5904 Chronic idiopathic constipation: Secondary | ICD-10-CM | POA: Diagnosis not present

## 2020-10-23 DIAGNOSIS — F341 Dysthymic disorder: Secondary | ICD-10-CM | POA: Diagnosis not present

## 2020-10-23 DIAGNOSIS — N029 Recurrent and persistent hematuria with unspecified morphologic changes: Secondary | ICD-10-CM | POA: Diagnosis not present

## 2020-10-23 DIAGNOSIS — Z1389 Encounter for screening for other disorder: Secondary | ICD-10-CM | POA: Diagnosis not present

## 2020-10-23 DIAGNOSIS — F329 Major depressive disorder, single episode, unspecified: Secondary | ICD-10-CM | POA: Diagnosis not present

## 2020-10-23 DIAGNOSIS — Z6820 Body mass index (BMI) 20.0-20.9, adult: Secondary | ICD-10-CM | POA: Diagnosis not present

## 2020-10-24 DIAGNOSIS — M9901 Segmental and somatic dysfunction of cervical region: Secondary | ICD-10-CM | POA: Diagnosis not present

## 2020-10-24 DIAGNOSIS — M5033 Other cervical disc degeneration, cervicothoracic region: Secondary | ICD-10-CM | POA: Diagnosis not present

## 2020-10-31 DIAGNOSIS — M5033 Other cervical disc degeneration, cervicothoracic region: Secondary | ICD-10-CM | POA: Diagnosis not present

## 2020-10-31 DIAGNOSIS — M9901 Segmental and somatic dysfunction of cervical region: Secondary | ICD-10-CM | POA: Diagnosis not present

## 2020-11-07 DIAGNOSIS — M9901 Segmental and somatic dysfunction of cervical region: Secondary | ICD-10-CM | POA: Diagnosis not present

## 2020-11-07 DIAGNOSIS — M5033 Other cervical disc degeneration, cervicothoracic region: Secondary | ICD-10-CM | POA: Diagnosis not present

## 2020-11-12 DIAGNOSIS — M5033 Other cervical disc degeneration, cervicothoracic region: Secondary | ICD-10-CM | POA: Diagnosis not present

## 2020-11-12 DIAGNOSIS — M9901 Segmental and somatic dysfunction of cervical region: Secondary | ICD-10-CM | POA: Diagnosis not present

## 2020-11-14 DIAGNOSIS — M5033 Other cervical disc degeneration, cervicothoracic region: Secondary | ICD-10-CM | POA: Diagnosis not present

## 2020-11-14 DIAGNOSIS — M9901 Segmental and somatic dysfunction of cervical region: Secondary | ICD-10-CM | POA: Diagnosis not present

## 2020-11-21 DIAGNOSIS — M5033 Other cervical disc degeneration, cervicothoracic region: Secondary | ICD-10-CM | POA: Diagnosis not present

## 2020-11-21 DIAGNOSIS — M9901 Segmental and somatic dysfunction of cervical region: Secondary | ICD-10-CM | POA: Diagnosis not present

## 2020-11-21 DIAGNOSIS — L570 Actinic keratosis: Secondary | ICD-10-CM | POA: Diagnosis not present

## 2020-11-21 DIAGNOSIS — L814 Other melanin hyperpigmentation: Secondary | ICD-10-CM | POA: Diagnosis not present

## 2020-11-22 DIAGNOSIS — X32XXXD Exposure to sunlight, subsequent encounter: Secondary | ICD-10-CM | POA: Diagnosis not present

## 2020-11-22 DIAGNOSIS — L570 Actinic keratosis: Secondary | ICD-10-CM | POA: Diagnosis not present

## 2020-11-22 DIAGNOSIS — D485 Neoplasm of uncertain behavior of skin: Secondary | ICD-10-CM | POA: Diagnosis not present

## 2020-11-28 DIAGNOSIS — M9901 Segmental and somatic dysfunction of cervical region: Secondary | ICD-10-CM | POA: Diagnosis not present

## 2020-11-28 DIAGNOSIS — M5033 Other cervical disc degeneration, cervicothoracic region: Secondary | ICD-10-CM | POA: Diagnosis not present

## 2020-12-05 DIAGNOSIS — M5033 Other cervical disc degeneration, cervicothoracic region: Secondary | ICD-10-CM | POA: Diagnosis not present

## 2020-12-05 DIAGNOSIS — M9901 Segmental and somatic dysfunction of cervical region: Secondary | ICD-10-CM | POA: Diagnosis not present

## 2020-12-12 DIAGNOSIS — M5033 Other cervical disc degeneration, cervicothoracic region: Secondary | ICD-10-CM | POA: Diagnosis not present

## 2020-12-12 DIAGNOSIS — M9901 Segmental and somatic dysfunction of cervical region: Secondary | ICD-10-CM | POA: Diagnosis not present

## 2020-12-17 DIAGNOSIS — M5033 Other cervical disc degeneration, cervicothoracic region: Secondary | ICD-10-CM | POA: Diagnosis not present

## 2020-12-17 DIAGNOSIS — M9901 Segmental and somatic dysfunction of cervical region: Secondary | ICD-10-CM | POA: Diagnosis not present

## 2020-12-18 DIAGNOSIS — M5033 Other cervical disc degeneration, cervicothoracic region: Secondary | ICD-10-CM | POA: Diagnosis not present

## 2020-12-18 DIAGNOSIS — M9901 Segmental and somatic dysfunction of cervical region: Secondary | ICD-10-CM | POA: Diagnosis not present

## 2020-12-26 DIAGNOSIS — M9901 Segmental and somatic dysfunction of cervical region: Secondary | ICD-10-CM | POA: Diagnosis not present

## 2020-12-26 DIAGNOSIS — M5033 Other cervical disc degeneration, cervicothoracic region: Secondary | ICD-10-CM | POA: Diagnosis not present

## 2021-01-03 DIAGNOSIS — M5033 Other cervical disc degeneration, cervicothoracic region: Secondary | ICD-10-CM | POA: Diagnosis not present

## 2021-01-03 DIAGNOSIS — M9901 Segmental and somatic dysfunction of cervical region: Secondary | ICD-10-CM | POA: Diagnosis not present

## 2021-01-09 DIAGNOSIS — M5033 Other cervical disc degeneration, cervicothoracic region: Secondary | ICD-10-CM | POA: Diagnosis not present

## 2021-01-09 DIAGNOSIS — M9901 Segmental and somatic dysfunction of cervical region: Secondary | ICD-10-CM | POA: Diagnosis not present

## 2021-01-10 DIAGNOSIS — M9901 Segmental and somatic dysfunction of cervical region: Secondary | ICD-10-CM | POA: Diagnosis not present

## 2021-01-10 DIAGNOSIS — M5033 Other cervical disc degeneration, cervicothoracic region: Secondary | ICD-10-CM | POA: Diagnosis not present

## 2021-01-21 DIAGNOSIS — M5033 Other cervical disc degeneration, cervicothoracic region: Secondary | ICD-10-CM | POA: Diagnosis not present

## 2021-01-21 DIAGNOSIS — M9901 Segmental and somatic dysfunction of cervical region: Secondary | ICD-10-CM | POA: Diagnosis not present

## 2021-01-24 DIAGNOSIS — M9901 Segmental and somatic dysfunction of cervical region: Secondary | ICD-10-CM | POA: Diagnosis not present

## 2021-01-24 DIAGNOSIS — M5033 Other cervical disc degeneration, cervicothoracic region: Secondary | ICD-10-CM | POA: Diagnosis not present

## 2021-01-30 DIAGNOSIS — M5033 Other cervical disc degeneration, cervicothoracic region: Secondary | ICD-10-CM | POA: Diagnosis not present

## 2021-01-30 DIAGNOSIS — M9901 Segmental and somatic dysfunction of cervical region: Secondary | ICD-10-CM | POA: Diagnosis not present

## 2021-02-13 DIAGNOSIS — M5033 Other cervical disc degeneration, cervicothoracic region: Secondary | ICD-10-CM | POA: Diagnosis not present

## 2021-02-13 DIAGNOSIS — M9901 Segmental and somatic dysfunction of cervical region: Secondary | ICD-10-CM | POA: Diagnosis not present

## 2021-02-14 DIAGNOSIS — M5033 Other cervical disc degeneration, cervicothoracic region: Secondary | ICD-10-CM | POA: Diagnosis not present

## 2021-02-14 DIAGNOSIS — M9901 Segmental and somatic dysfunction of cervical region: Secondary | ICD-10-CM | POA: Diagnosis not present

## 2021-02-20 DIAGNOSIS — M5033 Other cervical disc degeneration, cervicothoracic region: Secondary | ICD-10-CM | POA: Diagnosis not present

## 2021-02-20 DIAGNOSIS — M9901 Segmental and somatic dysfunction of cervical region: Secondary | ICD-10-CM | POA: Diagnosis not present

## 2021-02-27 DIAGNOSIS — M9901 Segmental and somatic dysfunction of cervical region: Secondary | ICD-10-CM | POA: Diagnosis not present

## 2021-02-27 DIAGNOSIS — M5033 Other cervical disc degeneration, cervicothoracic region: Secondary | ICD-10-CM | POA: Diagnosis not present

## 2021-03-06 DIAGNOSIS — M5033 Other cervical disc degeneration, cervicothoracic region: Secondary | ICD-10-CM | POA: Diagnosis not present

## 2021-03-06 DIAGNOSIS — M9901 Segmental and somatic dysfunction of cervical region: Secondary | ICD-10-CM | POA: Diagnosis not present

## 2021-03-12 DIAGNOSIS — Z23 Encounter for immunization: Secondary | ICD-10-CM | POA: Diagnosis not present

## 2021-03-13 DIAGNOSIS — M5033 Other cervical disc degeneration, cervicothoracic region: Secondary | ICD-10-CM | POA: Diagnosis not present

## 2021-03-13 DIAGNOSIS — M9901 Segmental and somatic dysfunction of cervical region: Secondary | ICD-10-CM | POA: Diagnosis not present

## 2021-03-20 DIAGNOSIS — M5033 Other cervical disc degeneration, cervicothoracic region: Secondary | ICD-10-CM | POA: Diagnosis not present

## 2021-03-20 DIAGNOSIS — M9901 Segmental and somatic dysfunction of cervical region: Secondary | ICD-10-CM | POA: Diagnosis not present

## 2021-03-27 DIAGNOSIS — M9901 Segmental and somatic dysfunction of cervical region: Secondary | ICD-10-CM | POA: Diagnosis not present

## 2021-03-27 DIAGNOSIS — M5033 Other cervical disc degeneration, cervicothoracic region: Secondary | ICD-10-CM | POA: Diagnosis not present

## 2021-04-03 DIAGNOSIS — M5033 Other cervical disc degeneration, cervicothoracic region: Secondary | ICD-10-CM | POA: Diagnosis not present

## 2021-04-03 DIAGNOSIS — M9901 Segmental and somatic dysfunction of cervical region: Secondary | ICD-10-CM | POA: Diagnosis not present

## 2021-04-10 DIAGNOSIS — M5033 Other cervical disc degeneration, cervicothoracic region: Secondary | ICD-10-CM | POA: Diagnosis not present

## 2021-04-10 DIAGNOSIS — M9901 Segmental and somatic dysfunction of cervical region: Secondary | ICD-10-CM | POA: Diagnosis not present

## 2021-04-17 DIAGNOSIS — M5033 Other cervical disc degeneration, cervicothoracic region: Secondary | ICD-10-CM | POA: Diagnosis not present

## 2021-04-17 DIAGNOSIS — M9901 Segmental and somatic dysfunction of cervical region: Secondary | ICD-10-CM | POA: Diagnosis not present

## 2021-04-18 ENCOUNTER — Other Ambulatory Visit: Payer: Self-pay | Admitting: Internal Medicine

## 2021-04-18 DIAGNOSIS — Z139 Encounter for screening, unspecified: Secondary | ICD-10-CM

## 2021-04-22 ENCOUNTER — Other Ambulatory Visit: Payer: Self-pay

## 2021-04-22 ENCOUNTER — Ambulatory Visit
Admission: RE | Admit: 2021-04-22 | Discharge: 2021-04-22 | Disposition: A | Discharge status: Home / Self Care | Payer: BC Managed Care – PPO | Source: Ambulatory Visit | Attending: Internal Medicine | Admitting: Internal Medicine

## 2021-04-22 DIAGNOSIS — Z1231 Encounter for screening mammogram for malignant neoplasm of breast: Secondary | ICD-10-CM | POA: Diagnosis not present

## 2021-04-22 DIAGNOSIS — Z139 Encounter for screening, unspecified: Secondary | ICD-10-CM

## 2021-04-24 DIAGNOSIS — M9901 Segmental and somatic dysfunction of cervical region: Secondary | ICD-10-CM | POA: Diagnosis not present

## 2021-04-24 DIAGNOSIS — M5033 Other cervical disc degeneration, cervicothoracic region: Secondary | ICD-10-CM | POA: Diagnosis not present

## 2021-05-01 DIAGNOSIS — M5033 Other cervical disc degeneration, cervicothoracic region: Secondary | ICD-10-CM | POA: Diagnosis not present

## 2021-05-01 DIAGNOSIS — M9901 Segmental and somatic dysfunction of cervical region: Secondary | ICD-10-CM | POA: Diagnosis not present

## 2021-05-08 DIAGNOSIS — M9901 Segmental and somatic dysfunction of cervical region: Secondary | ICD-10-CM | POA: Diagnosis not present

## 2021-05-08 DIAGNOSIS — M5033 Other cervical disc degeneration, cervicothoracic region: Secondary | ICD-10-CM | POA: Diagnosis not present

## 2021-05-13 DIAGNOSIS — M9901 Segmental and somatic dysfunction of cervical region: Secondary | ICD-10-CM | POA: Diagnosis not present

## 2021-05-13 DIAGNOSIS — M5033 Other cervical disc degeneration, cervicothoracic region: Secondary | ICD-10-CM | POA: Diagnosis not present

## 2021-05-22 DIAGNOSIS — M5033 Other cervical disc degeneration, cervicothoracic region: Secondary | ICD-10-CM | POA: Diagnosis not present

## 2021-05-22 DIAGNOSIS — M9901 Segmental and somatic dysfunction of cervical region: Secondary | ICD-10-CM | POA: Diagnosis not present

## 2021-05-29 DIAGNOSIS — M5033 Other cervical disc degeneration, cervicothoracic region: Secondary | ICD-10-CM | POA: Diagnosis not present

## 2021-05-29 DIAGNOSIS — M9901 Segmental and somatic dysfunction of cervical region: Secondary | ICD-10-CM | POA: Diagnosis not present

## 2021-06-05 DIAGNOSIS — M9901 Segmental and somatic dysfunction of cervical region: Secondary | ICD-10-CM | POA: Diagnosis not present

## 2021-06-05 DIAGNOSIS — M5033 Other cervical disc degeneration, cervicothoracic region: Secondary | ICD-10-CM | POA: Diagnosis not present

## 2021-06-10 DIAGNOSIS — M5033 Other cervical disc degeneration, cervicothoracic region: Secondary | ICD-10-CM | POA: Diagnosis not present

## 2021-06-10 DIAGNOSIS — M9901 Segmental and somatic dysfunction of cervical region: Secondary | ICD-10-CM | POA: Diagnosis not present

## 2021-06-19 DIAGNOSIS — M5033 Other cervical disc degeneration, cervicothoracic region: Secondary | ICD-10-CM | POA: Diagnosis not present

## 2021-06-19 DIAGNOSIS — M9901 Segmental and somatic dysfunction of cervical region: Secondary | ICD-10-CM | POA: Diagnosis not present

## 2021-06-26 DIAGNOSIS — M9901 Segmental and somatic dysfunction of cervical region: Secondary | ICD-10-CM | POA: Diagnosis not present

## 2021-06-26 DIAGNOSIS — M5033 Other cervical disc degeneration, cervicothoracic region: Secondary | ICD-10-CM | POA: Diagnosis not present

## 2021-07-03 DIAGNOSIS — M5033 Other cervical disc degeneration, cervicothoracic region: Secondary | ICD-10-CM | POA: Diagnosis not present

## 2021-07-03 DIAGNOSIS — M9901 Segmental and somatic dysfunction of cervical region: Secondary | ICD-10-CM | POA: Diagnosis not present

## 2021-07-04 DIAGNOSIS — M9901 Segmental and somatic dysfunction of cervical region: Secondary | ICD-10-CM | POA: Diagnosis not present

## 2021-07-04 DIAGNOSIS — M5033 Other cervical disc degeneration, cervicothoracic region: Secondary | ICD-10-CM | POA: Diagnosis not present

## 2021-07-10 DIAGNOSIS — M5033 Other cervical disc degeneration, cervicothoracic region: Secondary | ICD-10-CM | POA: Diagnosis not present

## 2021-07-10 DIAGNOSIS — M9901 Segmental and somatic dysfunction of cervical region: Secondary | ICD-10-CM | POA: Diagnosis not present

## 2021-07-17 DIAGNOSIS — M9901 Segmental and somatic dysfunction of cervical region: Secondary | ICD-10-CM | POA: Diagnosis not present

## 2021-07-17 DIAGNOSIS — M5033 Other cervical disc degeneration, cervicothoracic region: Secondary | ICD-10-CM | POA: Diagnosis not present

## 2021-07-24 DIAGNOSIS — M5033 Other cervical disc degeneration, cervicothoracic region: Secondary | ICD-10-CM | POA: Diagnosis not present

## 2021-07-24 DIAGNOSIS — M9901 Segmental and somatic dysfunction of cervical region: Secondary | ICD-10-CM | POA: Diagnosis not present

## 2021-07-31 DIAGNOSIS — M5033 Other cervical disc degeneration, cervicothoracic region: Secondary | ICD-10-CM | POA: Diagnosis not present

## 2021-07-31 DIAGNOSIS — M9901 Segmental and somatic dysfunction of cervical region: Secondary | ICD-10-CM | POA: Diagnosis not present

## 2021-08-05 DIAGNOSIS — M9901 Segmental and somatic dysfunction of cervical region: Secondary | ICD-10-CM | POA: Diagnosis not present

## 2021-08-05 DIAGNOSIS — M5033 Other cervical disc degeneration, cervicothoracic region: Secondary | ICD-10-CM | POA: Diagnosis not present

## 2021-08-08 DIAGNOSIS — M9901 Segmental and somatic dysfunction of cervical region: Secondary | ICD-10-CM | POA: Diagnosis not present

## 2021-08-08 DIAGNOSIS — M5033 Other cervical disc degeneration, cervicothoracic region: Secondary | ICD-10-CM | POA: Diagnosis not present

## 2021-08-14 DIAGNOSIS — M9901 Segmental and somatic dysfunction of cervical region: Secondary | ICD-10-CM | POA: Diagnosis not present

## 2021-08-14 DIAGNOSIS — M5033 Other cervical disc degeneration, cervicothoracic region: Secondary | ICD-10-CM | POA: Diagnosis not present

## 2021-08-21 DIAGNOSIS — M5033 Other cervical disc degeneration, cervicothoracic region: Secondary | ICD-10-CM | POA: Diagnosis not present

## 2021-08-21 DIAGNOSIS — M9901 Segmental and somatic dysfunction of cervical region: Secondary | ICD-10-CM | POA: Diagnosis not present

## 2021-08-28 DIAGNOSIS — M5033 Other cervical disc degeneration, cervicothoracic region: Secondary | ICD-10-CM | POA: Diagnosis not present

## 2021-08-28 DIAGNOSIS — M9901 Segmental and somatic dysfunction of cervical region: Secondary | ICD-10-CM | POA: Diagnosis not present

## 2021-09-04 DIAGNOSIS — M5033 Other cervical disc degeneration, cervicothoracic region: Secondary | ICD-10-CM | POA: Diagnosis not present

## 2021-09-04 DIAGNOSIS — M9901 Segmental and somatic dysfunction of cervical region: Secondary | ICD-10-CM | POA: Diagnosis not present

## 2021-09-11 DIAGNOSIS — M9901 Segmental and somatic dysfunction of cervical region: Secondary | ICD-10-CM | POA: Diagnosis not present

## 2021-09-11 DIAGNOSIS — M5033 Other cervical disc degeneration, cervicothoracic region: Secondary | ICD-10-CM | POA: Diagnosis not present

## 2021-09-18 DIAGNOSIS — M5033 Other cervical disc degeneration, cervicothoracic region: Secondary | ICD-10-CM | POA: Diagnosis not present

## 2021-09-18 DIAGNOSIS — M9901 Segmental and somatic dysfunction of cervical region: Secondary | ICD-10-CM | POA: Diagnosis not present

## 2021-09-25 DIAGNOSIS — M9901 Segmental and somatic dysfunction of cervical region: Secondary | ICD-10-CM | POA: Diagnosis not present

## 2021-09-25 DIAGNOSIS — M5033 Other cervical disc degeneration, cervicothoracic region: Secondary | ICD-10-CM | POA: Diagnosis not present

## 2021-10-02 DIAGNOSIS — M5033 Other cervical disc degeneration, cervicothoracic region: Secondary | ICD-10-CM | POA: Diagnosis not present

## 2021-10-02 DIAGNOSIS — M9901 Segmental and somatic dysfunction of cervical region: Secondary | ICD-10-CM | POA: Diagnosis not present

## 2021-10-07 DIAGNOSIS — M9901 Segmental and somatic dysfunction of cervical region: Secondary | ICD-10-CM | POA: Diagnosis not present

## 2021-10-07 DIAGNOSIS — M5033 Other cervical disc degeneration, cervicothoracic region: Secondary | ICD-10-CM | POA: Diagnosis not present

## 2021-10-16 DIAGNOSIS — M5033 Other cervical disc degeneration, cervicothoracic region: Secondary | ICD-10-CM | POA: Diagnosis not present

## 2021-10-16 DIAGNOSIS — M9901 Segmental and somatic dysfunction of cervical region: Secondary | ICD-10-CM | POA: Diagnosis not present

## 2021-10-23 DIAGNOSIS — M5033 Other cervical disc degeneration, cervicothoracic region: Secondary | ICD-10-CM | POA: Diagnosis not present

## 2021-10-23 DIAGNOSIS — M9901 Segmental and somatic dysfunction of cervical region: Secondary | ICD-10-CM | POA: Diagnosis not present

## 2021-11-12 IMAGING — MG MM DIGITAL SCREENING BILAT W/ TOMO AND CAD
6 of 10 series · 6 of 30 positions shown · non-contrast
Comparison: Previous exam(s).

CLINICAL DATA: Screening.

EXAM:
DIGITAL SCREENING BILATERAL MAMMOGRAM WITH TOMOSYNTHESIS AND CAD
TECHNIQUE: Bilateral screening digital craniocaudal and mediolateral oblique
mammograms were obtained. Bilateral screening digital breast
tomosynthesis was performed. The images were evaluated with
computer-aided detection.

[L MLO synth-2D (1 of 2)]
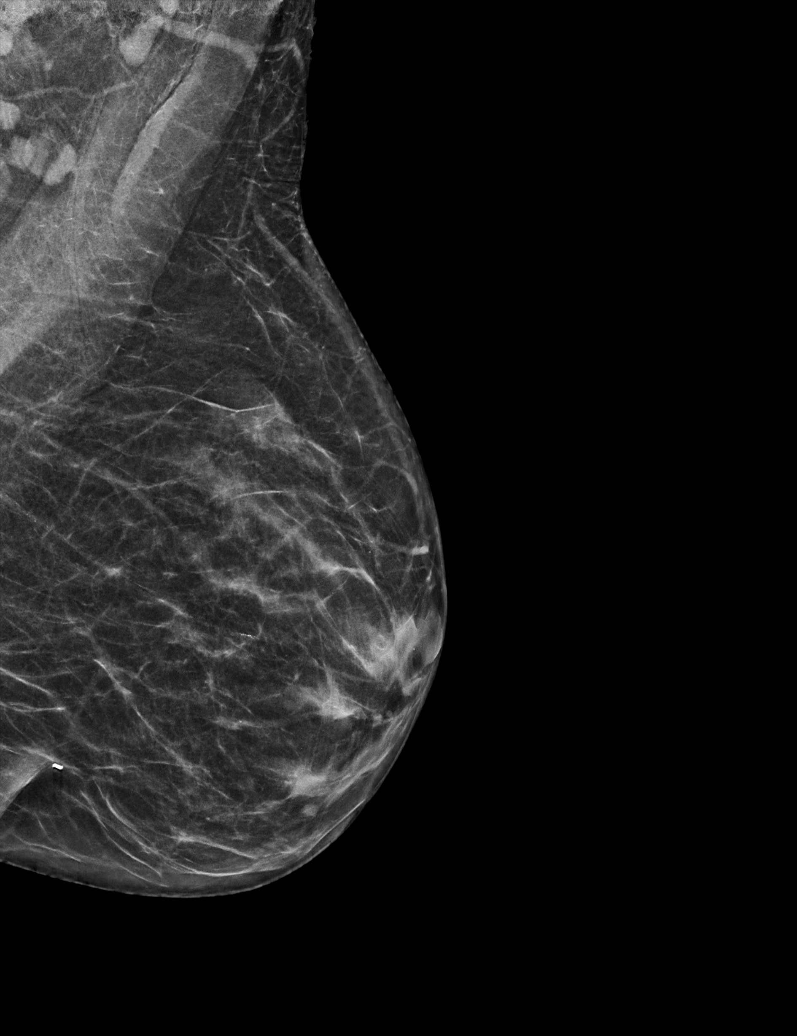

[L MLO synth-2D (2 of 2)]
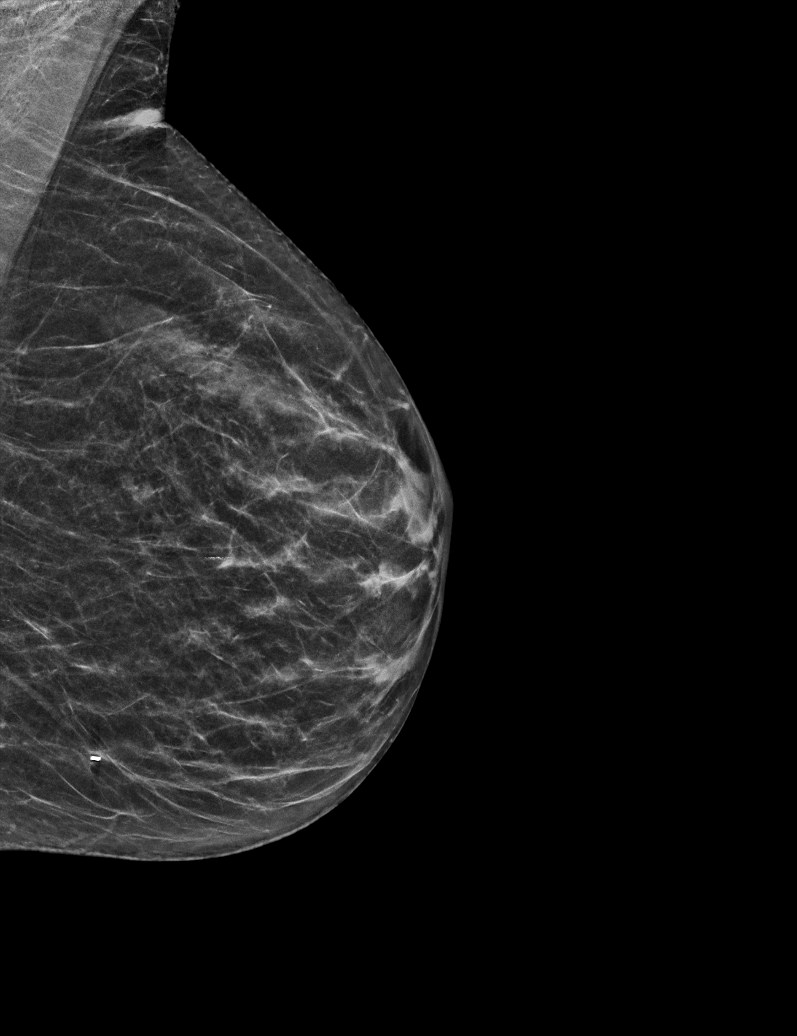

[L CC synth-2D]
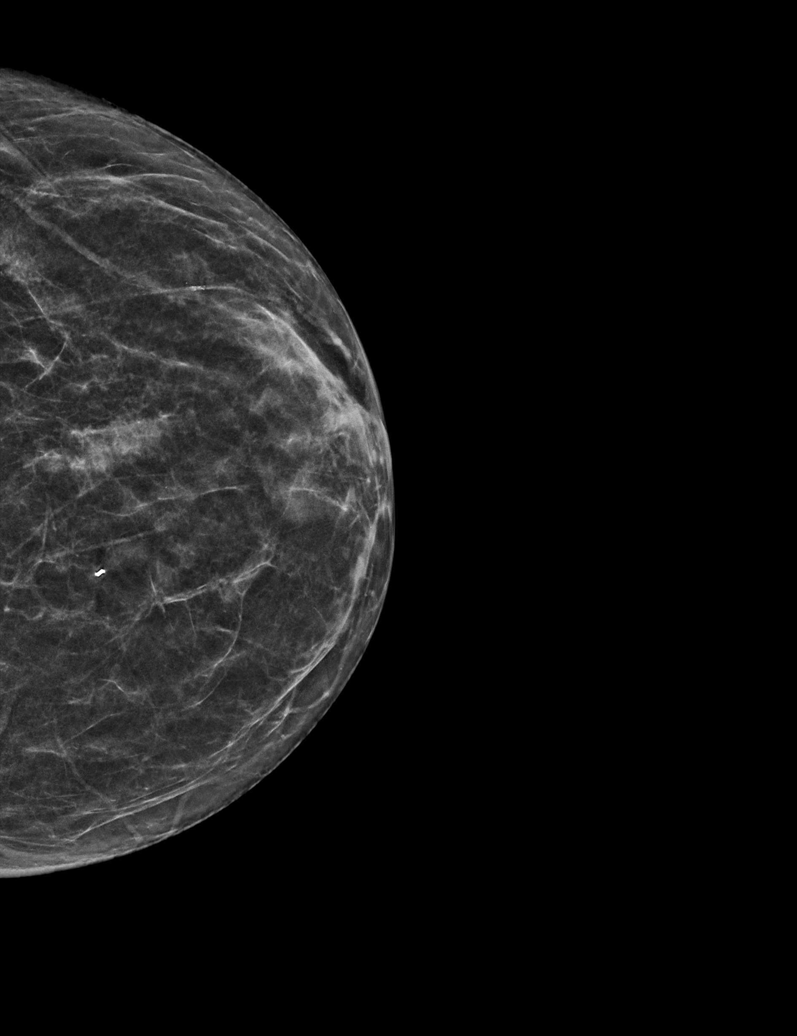

[R MLO synth-2D]
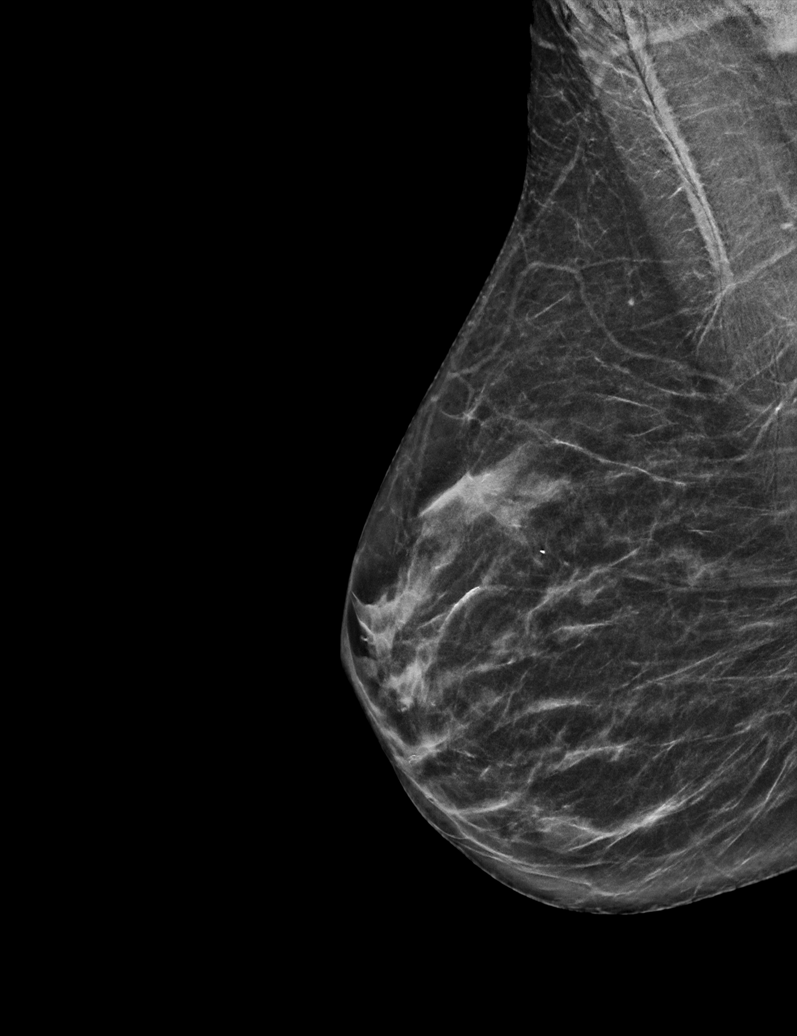

[R CC synth-2D]
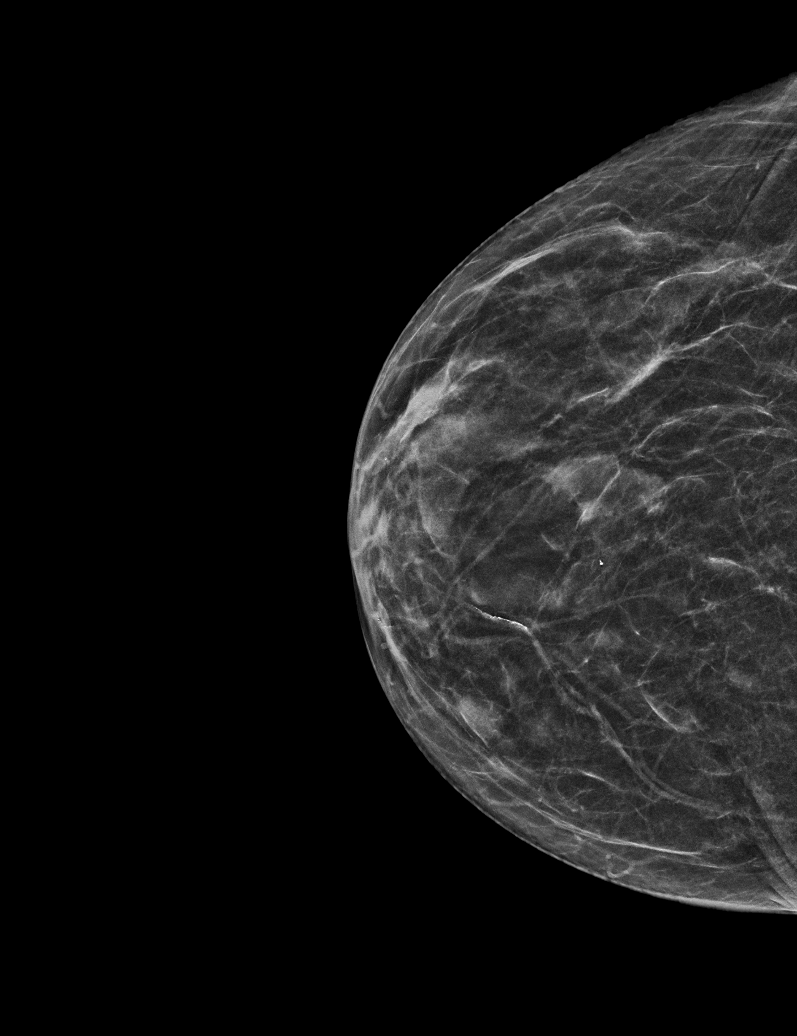

[R CC tomo · tomo slice 23/46.0]
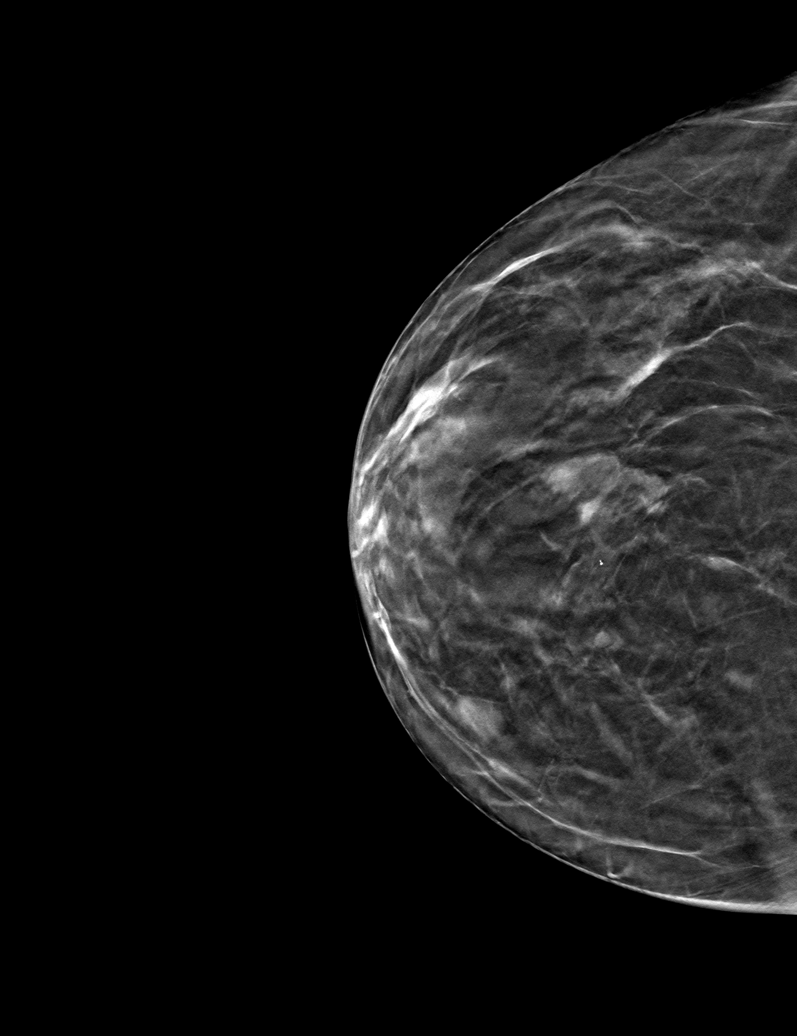

[6 of 30 positions shown; findings below may reference images not displayed]

ACR Breast Density Category b: There are scattered areas of
fibroglandular density.
FINDINGS: There are no findings suspicious for malignancy.
IMPRESSION: No mammographic evidence of malignancy. A result letter of this
screening mammogram will be mailed directly to the patient.

RECOMMENDATION:
Screening mammogram in one year. (Code:51-O-LD2)

BI-RADS CATEGORY  1: Negative.

## 2021-11-28 DIAGNOSIS — M5033 Other cervical disc degeneration, cervicothoracic region: Secondary | ICD-10-CM | POA: Diagnosis not present

## 2021-11-28 DIAGNOSIS — M9901 Segmental and somatic dysfunction of cervical region: Secondary | ICD-10-CM | POA: Diagnosis not present

## 2021-12-04 DIAGNOSIS — M5033 Other cervical disc degeneration, cervicothoracic region: Secondary | ICD-10-CM | POA: Diagnosis not present

## 2021-12-04 DIAGNOSIS — M9901 Segmental and somatic dysfunction of cervical region: Secondary | ICD-10-CM | POA: Diagnosis not present

## 2021-12-12 DIAGNOSIS — M5033 Other cervical disc degeneration, cervicothoracic region: Secondary | ICD-10-CM | POA: Diagnosis not present

## 2021-12-12 DIAGNOSIS — M9901 Segmental and somatic dysfunction of cervical region: Secondary | ICD-10-CM | POA: Diagnosis not present

## 2021-12-18 DIAGNOSIS — M5033 Other cervical disc degeneration, cervicothoracic region: Secondary | ICD-10-CM | POA: Diagnosis not present

## 2021-12-18 DIAGNOSIS — M9901 Segmental and somatic dysfunction of cervical region: Secondary | ICD-10-CM | POA: Diagnosis not present

## 2022-01-01 DIAGNOSIS — M5033 Other cervical disc degeneration, cervicothoracic region: Secondary | ICD-10-CM | POA: Diagnosis not present

## 2022-01-01 DIAGNOSIS — M9901 Segmental and somatic dysfunction of cervical region: Secondary | ICD-10-CM | POA: Diagnosis not present

## 2022-01-15 DIAGNOSIS — M5033 Other cervical disc degeneration, cervicothoracic region: Secondary | ICD-10-CM | POA: Diagnosis not present

## 2022-01-15 DIAGNOSIS — M9901 Segmental and somatic dysfunction of cervical region: Secondary | ICD-10-CM | POA: Diagnosis not present

## 2022-01-22 DIAGNOSIS — M5033 Other cervical disc degeneration, cervicothoracic region: Secondary | ICD-10-CM | POA: Diagnosis not present

## 2022-01-22 DIAGNOSIS — M9901 Segmental and somatic dysfunction of cervical region: Secondary | ICD-10-CM | POA: Diagnosis not present

## 2022-01-27 DIAGNOSIS — X32XXXD Exposure to sunlight, subsequent encounter: Secondary | ICD-10-CM | POA: Diagnosis not present

## 2022-01-27 DIAGNOSIS — D225 Melanocytic nevi of trunk: Secondary | ICD-10-CM | POA: Diagnosis not present

## 2022-01-27 DIAGNOSIS — L570 Actinic keratosis: Secondary | ICD-10-CM | POA: Diagnosis not present

## 2022-01-27 DIAGNOSIS — B078 Other viral warts: Secondary | ICD-10-CM | POA: Diagnosis not present

## 2022-01-27 DIAGNOSIS — Z1283 Encounter for screening for malignant neoplasm of skin: Secondary | ICD-10-CM | POA: Diagnosis not present

## 2022-01-29 DIAGNOSIS — M5033 Other cervical disc degeneration, cervicothoracic region: Secondary | ICD-10-CM | POA: Diagnosis not present

## 2022-01-29 DIAGNOSIS — M9901 Segmental and somatic dysfunction of cervical region: Secondary | ICD-10-CM | POA: Diagnosis not present

## 2022-02-05 DIAGNOSIS — M5033 Other cervical disc degeneration, cervicothoracic region: Secondary | ICD-10-CM | POA: Diagnosis not present

## 2022-02-05 DIAGNOSIS — M9901 Segmental and somatic dysfunction of cervical region: Secondary | ICD-10-CM | POA: Diagnosis not present

## 2022-02-12 DIAGNOSIS — M5033 Other cervical disc degeneration, cervicothoracic region: Secondary | ICD-10-CM | POA: Diagnosis not present

## 2022-02-12 DIAGNOSIS — M9901 Segmental and somatic dysfunction of cervical region: Secondary | ICD-10-CM | POA: Diagnosis not present

## 2022-02-19 DIAGNOSIS — M9901 Segmental and somatic dysfunction of cervical region: Secondary | ICD-10-CM | POA: Diagnosis not present

## 2022-02-19 DIAGNOSIS — M5033 Other cervical disc degeneration, cervicothoracic region: Secondary | ICD-10-CM | POA: Diagnosis not present

## 2022-02-27 DIAGNOSIS — M9901 Segmental and somatic dysfunction of cervical region: Secondary | ICD-10-CM | POA: Diagnosis not present

## 2022-02-27 DIAGNOSIS — M5033 Other cervical disc degeneration, cervicothoracic region: Secondary | ICD-10-CM | POA: Diagnosis not present

## 2022-03-06 DIAGNOSIS — M5033 Other cervical disc degeneration, cervicothoracic region: Secondary | ICD-10-CM | POA: Diagnosis not present

## 2022-03-06 DIAGNOSIS — M9901 Segmental and somatic dysfunction of cervical region: Secondary | ICD-10-CM | POA: Diagnosis not present

## 2022-03-10 ENCOUNTER — Other Ambulatory Visit: Payer: Self-pay | Admitting: Internal Medicine

## 2022-03-10 DIAGNOSIS — Z1231 Encounter for screening mammogram for malignant neoplasm of breast: Secondary | ICD-10-CM

## 2022-03-12 DIAGNOSIS — M9901 Segmental and somatic dysfunction of cervical region: Secondary | ICD-10-CM | POA: Diagnosis not present

## 2022-03-12 DIAGNOSIS — M5033 Other cervical disc degeneration, cervicothoracic region: Secondary | ICD-10-CM | POA: Diagnosis not present

## 2022-03-13 DIAGNOSIS — M9901 Segmental and somatic dysfunction of cervical region: Secondary | ICD-10-CM | POA: Diagnosis not present

## 2022-03-13 DIAGNOSIS — M5033 Other cervical disc degeneration, cervicothoracic region: Secondary | ICD-10-CM | POA: Diagnosis not present

## 2022-03-20 DIAGNOSIS — M5033 Other cervical disc degeneration, cervicothoracic region: Secondary | ICD-10-CM | POA: Diagnosis not present

## 2022-03-20 DIAGNOSIS — M9901 Segmental and somatic dysfunction of cervical region: Secondary | ICD-10-CM | POA: Diagnosis not present

## 2022-03-27 DIAGNOSIS — M5033 Other cervical disc degeneration, cervicothoracic region: Secondary | ICD-10-CM | POA: Diagnosis not present

## 2022-03-27 DIAGNOSIS — M9901 Segmental and somatic dysfunction of cervical region: Secondary | ICD-10-CM | POA: Diagnosis not present

## 2022-03-31 DIAGNOSIS — M5033 Other cervical disc degeneration, cervicothoracic region: Secondary | ICD-10-CM | POA: Diagnosis not present

## 2022-03-31 DIAGNOSIS — M9901 Segmental and somatic dysfunction of cervical region: Secondary | ICD-10-CM | POA: Diagnosis not present

## 2022-04-09 DIAGNOSIS — M9901 Segmental and somatic dysfunction of cervical region: Secondary | ICD-10-CM | POA: Diagnosis not present

## 2022-04-09 DIAGNOSIS — M5033 Other cervical disc degeneration, cervicothoracic region: Secondary | ICD-10-CM | POA: Diagnosis not present

## 2022-04-16 DIAGNOSIS — M9901 Segmental and somatic dysfunction of cervical region: Secondary | ICD-10-CM | POA: Diagnosis not present

## 2022-04-16 DIAGNOSIS — M5033 Other cervical disc degeneration, cervicothoracic region: Secondary | ICD-10-CM | POA: Diagnosis not present

## 2022-04-24 DIAGNOSIS — M5033 Other cervical disc degeneration, cervicothoracic region: Secondary | ICD-10-CM | POA: Diagnosis not present

## 2022-04-24 DIAGNOSIS — M9901 Segmental and somatic dysfunction of cervical region: Secondary | ICD-10-CM | POA: Diagnosis not present

## 2022-04-30 DIAGNOSIS — M5033 Other cervical disc degeneration, cervicothoracic region: Secondary | ICD-10-CM | POA: Diagnosis not present

## 2022-04-30 DIAGNOSIS — M9901 Segmental and somatic dysfunction of cervical region: Secondary | ICD-10-CM | POA: Diagnosis not present

## 2022-05-07 DIAGNOSIS — M9901 Segmental and somatic dysfunction of cervical region: Secondary | ICD-10-CM | POA: Diagnosis not present

## 2022-05-07 DIAGNOSIS — M5033 Other cervical disc degeneration, cervicothoracic region: Secondary | ICD-10-CM | POA: Diagnosis not present

## 2022-05-09 ENCOUNTER — Ambulatory Visit
Admission: RE | Admit: 2022-05-09 | Discharge: 2022-05-09 | Disposition: A | Discharge status: Home / Self Care | Payer: BC Managed Care – PPO | Source: Ambulatory Visit | Attending: Internal Medicine | Admitting: Internal Medicine

## 2022-05-09 DIAGNOSIS — Z6820 Body mass index (BMI) 20.0-20.9, adult: Secondary | ICD-10-CM | POA: Diagnosis not present

## 2022-05-09 DIAGNOSIS — Z0001 Encounter for general adult medical examination with abnormal findings: Secondary | ICD-10-CM | POA: Diagnosis not present

## 2022-05-09 DIAGNOSIS — Z1231 Encounter for screening mammogram for malignant neoplasm of breast: Secondary | ICD-10-CM

## 2022-05-09 DIAGNOSIS — Z1331 Encounter for screening for depression: Secondary | ICD-10-CM | POA: Diagnosis not present

## 2022-05-14 DIAGNOSIS — M5033 Other cervical disc degeneration, cervicothoracic region: Secondary | ICD-10-CM | POA: Diagnosis not present

## 2022-05-14 DIAGNOSIS — M9901 Segmental and somatic dysfunction of cervical region: Secondary | ICD-10-CM | POA: Diagnosis not present

## 2022-05-19 DIAGNOSIS — H35373 Puckering of macula, bilateral: Secondary | ICD-10-CM | POA: Diagnosis not present

## 2022-05-21 DIAGNOSIS — M5033 Other cervical disc degeneration, cervicothoracic region: Secondary | ICD-10-CM | POA: Diagnosis not present

## 2022-05-21 DIAGNOSIS — M9901 Segmental and somatic dysfunction of cervical region: Secondary | ICD-10-CM | POA: Diagnosis not present

## 2022-05-22 DIAGNOSIS — M9901 Segmental and somatic dysfunction of cervical region: Secondary | ICD-10-CM | POA: Diagnosis not present

## 2022-05-22 DIAGNOSIS — M5033 Other cervical disc degeneration, cervicothoracic region: Secondary | ICD-10-CM | POA: Diagnosis not present

## 2022-05-28 DIAGNOSIS — M5033 Other cervical disc degeneration, cervicothoracic region: Secondary | ICD-10-CM | POA: Diagnosis not present

## 2022-05-28 DIAGNOSIS — M9901 Segmental and somatic dysfunction of cervical region: Secondary | ICD-10-CM | POA: Diagnosis not present

## 2022-06-04 DIAGNOSIS — M9901 Segmental and somatic dysfunction of cervical region: Secondary | ICD-10-CM | POA: Diagnosis not present

## 2022-06-04 DIAGNOSIS — M5033 Other cervical disc degeneration, cervicothoracic region: Secondary | ICD-10-CM | POA: Diagnosis not present

## 2022-06-11 DIAGNOSIS — M9901 Segmental and somatic dysfunction of cervical region: Secondary | ICD-10-CM | POA: Diagnosis not present

## 2022-06-11 DIAGNOSIS — M5033 Other cervical disc degeneration, cervicothoracic region: Secondary | ICD-10-CM | POA: Diagnosis not present

## 2022-06-12 ENCOUNTER — Ambulatory Visit: Payer: BC Managed Care – PPO | Admitting: Obstetrics & Gynecology

## 2022-06-18 DIAGNOSIS — M5033 Other cervical disc degeneration, cervicothoracic region: Secondary | ICD-10-CM | POA: Diagnosis not present

## 2022-06-18 DIAGNOSIS — M9901 Segmental and somatic dysfunction of cervical region: Secondary | ICD-10-CM | POA: Diagnosis not present

## 2022-06-20 ENCOUNTER — Ambulatory Visit: Payer: BC Managed Care – PPO | Admitting: Obstetrics & Gynecology

## 2022-06-26 DIAGNOSIS — M9901 Segmental and somatic dysfunction of cervical region: Secondary | ICD-10-CM | POA: Diagnosis not present

## 2022-06-26 DIAGNOSIS — M5033 Other cervical disc degeneration, cervicothoracic region: Secondary | ICD-10-CM | POA: Diagnosis not present

## 2022-06-30 DIAGNOSIS — M9901 Segmental and somatic dysfunction of cervical region: Secondary | ICD-10-CM | POA: Diagnosis not present

## 2022-06-30 DIAGNOSIS — M5033 Other cervical disc degeneration, cervicothoracic region: Secondary | ICD-10-CM | POA: Diagnosis not present

## 2022-07-03 DIAGNOSIS — M5033 Other cervical disc degeneration, cervicothoracic region: Secondary | ICD-10-CM | POA: Diagnosis not present

## 2022-07-03 DIAGNOSIS — M9901 Segmental and somatic dysfunction of cervical region: Secondary | ICD-10-CM | POA: Diagnosis not present

## 2022-07-09 DIAGNOSIS — M5033 Other cervical disc degeneration, cervicothoracic region: Secondary | ICD-10-CM | POA: Diagnosis not present

## 2022-07-09 DIAGNOSIS — M9901 Segmental and somatic dysfunction of cervical region: Secondary | ICD-10-CM | POA: Diagnosis not present

## 2022-07-16 DIAGNOSIS — M5033 Other cervical disc degeneration, cervicothoracic region: Secondary | ICD-10-CM | POA: Diagnosis not present

## 2022-07-16 DIAGNOSIS — M9901 Segmental and somatic dysfunction of cervical region: Secondary | ICD-10-CM | POA: Diagnosis not present

## 2022-07-23 DIAGNOSIS — M5033 Other cervical disc degeneration, cervicothoracic region: Secondary | ICD-10-CM | POA: Diagnosis not present

## 2022-07-23 DIAGNOSIS — M9901 Segmental and somatic dysfunction of cervical region: Secondary | ICD-10-CM | POA: Diagnosis not present

## 2022-07-30 DIAGNOSIS — M5033 Other cervical disc degeneration, cervicothoracic region: Secondary | ICD-10-CM | POA: Diagnosis not present

## 2022-07-30 DIAGNOSIS — M9901 Segmental and somatic dysfunction of cervical region: Secondary | ICD-10-CM | POA: Diagnosis not present

## 2022-08-06 DIAGNOSIS — M9901 Segmental and somatic dysfunction of cervical region: Secondary | ICD-10-CM | POA: Diagnosis not present

## 2022-08-06 DIAGNOSIS — M5033 Other cervical disc degeneration, cervicothoracic region: Secondary | ICD-10-CM | POA: Diagnosis not present

## 2022-08-13 DIAGNOSIS — M5033 Other cervical disc degeneration, cervicothoracic region: Secondary | ICD-10-CM | POA: Diagnosis not present

## 2022-08-13 DIAGNOSIS — M9901 Segmental and somatic dysfunction of cervical region: Secondary | ICD-10-CM | POA: Diagnosis not present

## 2022-08-20 DIAGNOSIS — M5033 Other cervical disc degeneration, cervicothoracic region: Secondary | ICD-10-CM | POA: Diagnosis not present

## 2022-08-20 DIAGNOSIS — M9901 Segmental and somatic dysfunction of cervical region: Secondary | ICD-10-CM | POA: Diagnosis not present

## 2022-08-27 DIAGNOSIS — M5033 Other cervical disc degeneration, cervicothoracic region: Secondary | ICD-10-CM | POA: Diagnosis not present

## 2022-08-27 DIAGNOSIS — M9901 Segmental and somatic dysfunction of cervical region: Secondary | ICD-10-CM | POA: Diagnosis not present

## 2022-09-03 DIAGNOSIS — M5033 Other cervical disc degeneration, cervicothoracic region: Secondary | ICD-10-CM | POA: Diagnosis not present

## 2022-09-03 DIAGNOSIS — M9901 Segmental and somatic dysfunction of cervical region: Secondary | ICD-10-CM | POA: Diagnosis not present

## 2022-09-10 DIAGNOSIS — M5033 Other cervical disc degeneration, cervicothoracic region: Secondary | ICD-10-CM | POA: Diagnosis not present

## 2022-09-10 DIAGNOSIS — M9901 Segmental and somatic dysfunction of cervical region: Secondary | ICD-10-CM | POA: Diagnosis not present

## 2022-09-17 DIAGNOSIS — M5033 Other cervical disc degeneration, cervicothoracic region: Secondary | ICD-10-CM | POA: Diagnosis not present

## 2022-09-17 DIAGNOSIS — M9901 Segmental and somatic dysfunction of cervical region: Secondary | ICD-10-CM | POA: Diagnosis not present

## 2022-09-24 DIAGNOSIS — M5033 Other cervical disc degeneration, cervicothoracic region: Secondary | ICD-10-CM | POA: Diagnosis not present

## 2022-09-24 DIAGNOSIS — M9901 Segmental and somatic dysfunction of cervical region: Secondary | ICD-10-CM | POA: Diagnosis not present

## 2022-10-01 DIAGNOSIS — M5033 Other cervical disc degeneration, cervicothoracic region: Secondary | ICD-10-CM | POA: Diagnosis not present

## 2022-10-01 DIAGNOSIS — M9901 Segmental and somatic dysfunction of cervical region: Secondary | ICD-10-CM | POA: Diagnosis not present

## 2022-10-15 DIAGNOSIS — M5033 Other cervical disc degeneration, cervicothoracic region: Secondary | ICD-10-CM | POA: Diagnosis not present

## 2022-10-15 DIAGNOSIS — M9901 Segmental and somatic dysfunction of cervical region: Secondary | ICD-10-CM | POA: Diagnosis not present

## 2022-10-22 DIAGNOSIS — M9901 Segmental and somatic dysfunction of cervical region: Secondary | ICD-10-CM | POA: Diagnosis not present

## 2022-10-22 DIAGNOSIS — M5033 Other cervical disc degeneration, cervicothoracic region: Secondary | ICD-10-CM | POA: Diagnosis not present

## 2022-10-29 DIAGNOSIS — M5033 Other cervical disc degeneration, cervicothoracic region: Secondary | ICD-10-CM | POA: Diagnosis not present

## 2022-10-29 DIAGNOSIS — M9901 Segmental and somatic dysfunction of cervical region: Secondary | ICD-10-CM | POA: Diagnosis not present

## 2022-11-03 ENCOUNTER — Encounter: Payer: Self-pay | Admitting: *Deleted

## 2022-11-03 DIAGNOSIS — M5033 Other cervical disc degeneration, cervicothoracic region: Secondary | ICD-10-CM | POA: Diagnosis not present

## 2022-11-03 DIAGNOSIS — M9901 Segmental and somatic dysfunction of cervical region: Secondary | ICD-10-CM | POA: Diagnosis not present

## 2022-11-05 DIAGNOSIS — M9901 Segmental and somatic dysfunction of cervical region: Secondary | ICD-10-CM | POA: Diagnosis not present

## 2022-11-05 DIAGNOSIS — M5033 Other cervical disc degeneration, cervicothoracic region: Secondary | ICD-10-CM | POA: Diagnosis not present

## 2022-11-12 DIAGNOSIS — M5033 Other cervical disc degeneration, cervicothoracic region: Secondary | ICD-10-CM | POA: Diagnosis not present

## 2022-11-12 DIAGNOSIS — M9901 Segmental and somatic dysfunction of cervical region: Secondary | ICD-10-CM | POA: Diagnosis not present

## 2022-11-26 DIAGNOSIS — M9901 Segmental and somatic dysfunction of cervical region: Secondary | ICD-10-CM | POA: Diagnosis not present

## 2022-11-26 DIAGNOSIS — M5033 Other cervical disc degeneration, cervicothoracic region: Secondary | ICD-10-CM | POA: Diagnosis not present

## 2022-12-03 DIAGNOSIS — M5033 Other cervical disc degeneration, cervicothoracic region: Secondary | ICD-10-CM | POA: Diagnosis not present

## 2022-12-03 DIAGNOSIS — M9901 Segmental and somatic dysfunction of cervical region: Secondary | ICD-10-CM | POA: Diagnosis not present

## 2022-12-10 DIAGNOSIS — M9901 Segmental and somatic dysfunction of cervical region: Secondary | ICD-10-CM | POA: Diagnosis not present

## 2022-12-10 DIAGNOSIS — M5033 Other cervical disc degeneration, cervicothoracic region: Secondary | ICD-10-CM | POA: Diagnosis not present

## 2022-12-17 DIAGNOSIS — M5033 Other cervical disc degeneration, cervicothoracic region: Secondary | ICD-10-CM | POA: Diagnosis not present

## 2022-12-17 DIAGNOSIS — M9901 Segmental and somatic dysfunction of cervical region: Secondary | ICD-10-CM | POA: Diagnosis not present

## 2022-12-22 ENCOUNTER — Ambulatory Visit: Payer: BC Managed Care – PPO | Admitting: Gastroenterology

## 2022-12-29 ENCOUNTER — Encounter: Payer: Self-pay | Admitting: Gastroenterology

## 2022-12-29 ENCOUNTER — Ambulatory Visit (INDEPENDENT_AMBULATORY_CARE_PROVIDER_SITE_OTHER): Payer: BC Managed Care – PPO | Admitting: Gastroenterology

## 2022-12-29 VITALS — BP 113/74 | HR 60 | Temp 97.9°F | Ht 64.0 in | Wt 111.6 lb

## 2022-12-29 DIAGNOSIS — Z83719 Family history of colon polyps, unspecified: Secondary | ICD-10-CM | POA: Diagnosis not present

## 2022-12-29 DIAGNOSIS — K5909 Other constipation: Secondary | ICD-10-CM

## 2022-12-29 NOTE — Progress Notes (Signed)
GI Office Note    Referring Provider: Elfredia Nevins, MD Primary Care Physician:  Elfredia Nevins, MD  Primary Gastroenterologist: Roetta Sessions, MD   Chief Complaint   Chief Complaint  Patient presents with   Colonoscopy     History of Present Illness   Jasmine Fox is a 62 y.o. female presenting today to schedule screening colonoscopy. Her last colonoscopy was in 2014. She believes her dad had colon polyps. No FH colon cancer.   She struggles with constipation. BM 2 per week. No melena, brbpr. No abdominal pain. She consumes four to five 16 ounce water bottles per day. Eats Banana, Apple, berries daily. Vegetables twice per week.No heartburn. Used to have night time burping but controls by eating earlier in the day. Tried Miralax in the past but seemed to lose efficacy.   Colonoscopy 10/2012: -normal ileocolonoscopy   Medications   No current outpatient medications on file.   No current facility-administered medications for this visit.    Allergies   Allergies as of 12/29/2022   (No Known Allergies)    Past Medical History   Past Medical History:  Diagnosis Date   Constipation    Migraine    none since 2013   Urolithiasis     Past Surgical History   Past Surgical History:  Procedure Laterality Date   BREAST BIOPSY Left 08/23/2008   CATARACT EXTRACTION Left 07/27/2018   COLONOSCOPY N/A 11/10/2012   Procedure: COLONOSCOPY;  Surgeon: Corbin Ade, MD;  Location: [facility];  Service: Endoscopy;  Laterality: N/A;  11:15   ESOPHAGOGASTRODUODENOSCOPY  02/18/2002   ZOX:WRUEAV esophagus/s/p 54 French Maloney dilator   KIDNEY STONE SURGERY     lt bunion surgery      Past Family History   Family History  Problem Relation Age of Onset   Heart disease Mother        congestive heart failure   Diabetes Father    Hypertension Father    Colonic polyp Father    Cancer Brother        brain cancer   Hypertension Brother    Colon cancer Neg Hx      Past Social History   Social History   Socioeconomic History   Marital status: Married    Spouse name: Not on file   Number of children: 0   Years of education: Not on file   Highest education level: Not on file  Occupational History    Employer: [employer]  Tobacco Use   Smoking status: Former   Smokeless tobacco: Never   Tobacco comments:    Only about 2 packs cigarettes in lifetime  Vaping Use   Vaping Use: Never used  Substance and Sexual Activity   Alcohol use: No    Alcohol/week: 0.0 standard drinks of alcohol   Drug use: No   Sexual activity: Not Currently    Birth control/protection: Post-menopausal  Other Topics Concern   Not on file  Social History Narrative   Not on file   Social Determinants of Health   Financial Resource Strain: Not on file  Food Insecurity: Not on file  Transportation Needs: Not on file  Physical Activity: Not on file  Stress: Not on file  Social Connections: Not on file  Intimate Partner Violence: Not on file    Review of Systems   General: Negative for anorexia, weight loss, fever, chills, fatigue, weakness. Eyes: Negative for vision changes.  ENT: Negative for hoarseness, difficulty swallowing , nasal  congestion. CV: Negative for chest pain, angina, palpitations, dyspnea on exertion, peripheral edema.  Respiratory: Negative for dyspnea at rest, dyspnea on exertion, cough, sputum, wheezing.  GI: See history of present illness. GU:  Negative for dysuria, hematuria, urinary incontinence, urinary frequency, nocturnal urination.  MS: Negative for joint pain, low back pain.  Derm: Negative for rash or itching.  Neuro: Negative for weakness, abnormal sensation, seizure, frequent headaches, memory loss,  confusion.  Psych: Negative for anxiety, depression, suicidal ideation, hallucinations.  Endo: Negative for unusual weight change.  Heme: Negative for bruising or bleeding. Allergy: Negative for rash or hives.  Physical Exam   BP  113/74 (BP Location: Right Arm, Patient Position: Sitting, Cuff Size: Normal)   Pulse 60   Temp 97.9 F (36.6 C) (Oral)   Ht 5\' 4"  (1.626 m)   Wt 111 lb 9.6 oz (50.6 kg)   SpO2 97%   BMI 19.16 kg/m    General: Well-nourished, well-developed in no acute distress.  Head: Normocephalic, atraumatic.   Eyes: Conjunctiva pink, no icterus. Mouth: Oropharyngeal mucosa moist and pink  Neck: Supple without thyromegaly, masses, or lymphadenopathy.  Lungs: Clear to auscultation bilaterally.  Heart: Regular rate and rhythm, no murmurs rubs or gallops.  Abdomen: Bowel sounds are normal, nontender, nondistended, no hepatosplenomegaly or masses,  no abdominal bruits or hernia, no rebound or guarding.   Rectal: not performed Extremities: No lower extremity edema. No clubbing or deformities.  Neuro: Alert and oriented x 4 , grossly normal neurologically.  Skin: Warm and dry, no rash or jaundice.   Psych: Alert and cooperative, normal mood and affect.  Labs   None available  Imaging Studies   No results found.  Assessment   Encounter for screening colonoscopy: last colonoscopy in 2014. She believes her father had colon polyps.   Constipation: She has chronic constipation, poorly managed at this time. Recommend increasing dietary fiber. She appears to drink adequate fluids. Will add Linzess daily the week before her colonoscopy to augment bowel prep. She has been provided with additional samples to try now. She will call for RX if finds helpful.    PLAN   Increase dietary fiber.  Trial of Linzess daily. Samples provided. She will use some for the week before her colonoscopy to help with bowel prep. Colonoscopy with Dr. Jena Gauss. ASA 2.  I have discussed the risks, alternatives, benefits with regards to but not limited to the risk of reaction to medication, bleeding, infection, perforation and the patient is agreeable to proceed. Written consent to be obtained.    Leanna Battles.  Melvyn Neth, MHS, PA-C [facility]

## 2022-12-29 NOTE — Patient Instructions (Signed)
Try Linzess once daily on empty stomach as needed for constipation. We have provided samples. Call if you want a prescription. There are lower doses so if needed we can make adjustments.  Make sure to SAVE enough (at least 7 capsules) Linzess for the week before your colonoscopy. We will ask you take one daily starting one week before your colonoscopy bowel prep starts (see dates on procedure instructions to be given). Colonoscopy to be scheduled.  Try to increase dietary fiber.

## 2022-12-31 DIAGNOSIS — M9901 Segmental and somatic dysfunction of cervical region: Secondary | ICD-10-CM | POA: Diagnosis not present

## 2022-12-31 DIAGNOSIS — M5033 Other cervical disc degeneration, cervicothoracic region: Secondary | ICD-10-CM | POA: Diagnosis not present

## 2023-01-06 ENCOUNTER — Telehealth: Payer: Self-pay | Admitting: *Deleted

## 2023-01-06 NOTE — Telephone Encounter (Signed)
LMTRC  TCS w/Dr.Rourk, ASA 2

## 2023-01-12 NOTE — Telephone Encounter (Signed)
Pt called and stated she would prefer a Monday in September. Advised pt that we don't have providers schedule at this time but will give her a call once we get it.

## 2023-01-14 DIAGNOSIS — M5033 Other cervical disc degeneration, cervicothoracic region: Secondary | ICD-10-CM | POA: Diagnosis not present

## 2023-01-14 DIAGNOSIS — M9901 Segmental and somatic dysfunction of cervical region: Secondary | ICD-10-CM | POA: Diagnosis not present

## 2023-01-16 ENCOUNTER — Telehealth: Payer: Self-pay

## 2023-01-16 MED ORDER — LINACLOTIDE 290 MCG PO CAPS: 290.0000 ug | ORAL_CAPSULE | Freq: Every day | ORAL | 11 refills | Status: DC

## 2023-01-16 NOTE — Telephone Encounter (Signed)
Pt called and lmom stating that the Linzess samples that she was given at her office visit worked and that she would like for an rx to be sent to [facility].

## 2023-01-16 NOTE — Telephone Encounter (Signed)
Rx sent 

## 2023-01-16 NOTE — Addendum Note (Signed)
Addended by: Tiffany Kocher on: 01/16/2023 11:46 AM   Modules accepted: Orders

## 2023-01-21 ENCOUNTER — Telehealth: Payer: Self-pay

## 2023-01-21 DIAGNOSIS — M9901 Segmental and somatic dysfunction of cervical region: Secondary | ICD-10-CM | POA: Diagnosis not present

## 2023-01-21 DIAGNOSIS — M5033 Other cervical disc degeneration, cervicothoracic region: Secondary | ICD-10-CM | POA: Diagnosis not present

## 2023-01-21 MED ORDER — TRULANCE 3 MG PO TABS: 3.0000 mg | ORAL_TABLET | Freq: Every day | ORAL | 5 refills | Status: DC

## 2023-01-21 NOTE — Telephone Encounter (Signed)
Pt's insurance does not cover Linzess and is requiring her to try Trulance. Please send to [facility].

## 2023-01-21 NOTE — Telephone Encounter (Signed)
Rx for trulance sent. Does patient know?

## 2023-01-21 NOTE — Addendum Note (Signed)
Addended by: Tiffany Kocher on: 01/21/2023 12:10 PM   Modules accepted: Orders

## 2023-01-28 DIAGNOSIS — M5033 Other cervical disc degeneration, cervicothoracic region: Secondary | ICD-10-CM | POA: Diagnosis not present

## 2023-01-28 DIAGNOSIS — M9901 Segmental and somatic dysfunction of cervical region: Secondary | ICD-10-CM | POA: Diagnosis not present

## 2023-02-02 DIAGNOSIS — M9901 Segmental and somatic dysfunction of cervical region: Secondary | ICD-10-CM | POA: Diagnosis not present

## 2023-02-02 DIAGNOSIS — M5033 Other cervical disc degeneration, cervicothoracic region: Secondary | ICD-10-CM | POA: Diagnosis not present

## 2023-02-04 ENCOUNTER — Other Ambulatory Visit: Payer: Self-pay | Admitting: *Deleted

## 2023-02-04 ENCOUNTER — Encounter: Payer: Self-pay | Admitting: *Deleted

## 2023-02-04 DIAGNOSIS — M5033 Other cervical disc degeneration, cervicothoracic region: Secondary | ICD-10-CM | POA: Diagnosis not present

## 2023-02-04 DIAGNOSIS — M9901 Segmental and somatic dysfunction of cervical region: Secondary | ICD-10-CM | POA: Diagnosis not present

## 2023-02-04 MED ORDER — PEG 3350-KCL-NA BICARB-NACL 420 G PO SOLR: 4000.0000 mL | Freq: Once | ORAL | 0 refills | Status: AC

## 2023-02-04 NOTE — Telephone Encounter (Signed)
Pt has been scheduled for 03/09/23, instructions sent via MyChart and prep sent to the pharmacy.

## 2023-02-04 NOTE — Telephone Encounter (Signed)
LMOVM to call back to schedule for sept

## 2023-02-04 NOTE — Telephone Encounter (Signed)
Pt moved to 12:30 pm on 03/09/23 due to conflict in schedule. Updated instructions sent to pt

## 2023-02-11 DIAGNOSIS — M9901 Segmental and somatic dysfunction of cervical region: Secondary | ICD-10-CM | POA: Diagnosis not present

## 2023-02-11 DIAGNOSIS — M5033 Other cervical disc degeneration, cervicothoracic region: Secondary | ICD-10-CM | POA: Diagnosis not present

## 2023-02-18 DIAGNOSIS — M5033 Other cervical disc degeneration, cervicothoracic region: Secondary | ICD-10-CM | POA: Diagnosis not present

## 2023-02-18 DIAGNOSIS — M9901 Segmental and somatic dysfunction of cervical region: Secondary | ICD-10-CM | POA: Diagnosis not present

## 2023-02-25 DIAGNOSIS — M9901 Segmental and somatic dysfunction of cervical region: Secondary | ICD-10-CM | POA: Diagnosis not present

## 2023-02-25 DIAGNOSIS — M5033 Other cervical disc degeneration, cervicothoracic region: Secondary | ICD-10-CM | POA: Diagnosis not present

## 2023-03-02 DIAGNOSIS — M5033 Other cervical disc degeneration, cervicothoracic region: Secondary | ICD-10-CM | POA: Diagnosis not present

## 2023-03-02 DIAGNOSIS — M9901 Segmental and somatic dysfunction of cervical region: Secondary | ICD-10-CM | POA: Diagnosis not present

## 2023-03-09 ENCOUNTER — Encounter (HOSPITAL_COMMUNITY): Payer: Self-pay | Admitting: Internal Medicine

## 2023-03-09 ENCOUNTER — Ambulatory Visit (HOSPITAL_COMMUNITY)
Admission: RE | Admit: 2023-03-09 | Discharge: 2023-03-09 | Disposition: A | Discharge status: Home / Self Care | Payer: BC Managed Care – PPO | Attending: Internal Medicine | Admitting: Internal Medicine

## 2023-03-09 ENCOUNTER — Ambulatory Visit (HOSPITAL_COMMUNITY): Payer: BC Managed Care – PPO | Admitting: Certified Registered Nurse Anesthetist

## 2023-03-09 ENCOUNTER — Encounter (HOSPITAL_COMMUNITY)
Admission: RE | Disposition: A | Discharge status: Home / Self Care | Payer: Self-pay | Source: Home / Self Care | Attending: Internal Medicine

## 2023-03-09 ENCOUNTER — Other Ambulatory Visit: Payer: Self-pay

## 2023-03-09 DIAGNOSIS — K5909 Other constipation: Secondary | ICD-10-CM | POA: Insufficient documentation

## 2023-03-09 DIAGNOSIS — Z87891 Personal history of nicotine dependence: Secondary | ICD-10-CM | POA: Diagnosis not present

## 2023-03-09 DIAGNOSIS — Q438 Other specified congenital malformations of intestine: Secondary | ICD-10-CM | POA: Insufficient documentation

## 2023-03-09 DIAGNOSIS — D122 Benign neoplasm of ascending colon: Secondary | ICD-10-CM | POA: Insufficient documentation

## 2023-03-09 DIAGNOSIS — Z1211 Encounter for screening for malignant neoplasm of colon: Secondary | ICD-10-CM | POA: Diagnosis not present

## 2023-03-09 DIAGNOSIS — K635 Polyp of colon: Secondary | ICD-10-CM | POA: Diagnosis not present

## 2023-03-09 HISTORY — PX: POLYPECTOMY: SHX149

## 2023-03-09 HISTORY — PX: COLONOSCOPY WITH PROPOFOL: SHX5780

## 2023-03-09 SURGERY — COLONOSCOPY WITH PROPOFOL
Anesthesia: General

## 2023-03-09 MED ORDER — GLYCOPYRROLATE PF 0.2 MG/ML IJ SOSY
PREFILLED_SYRINGE | INTRAMUSCULAR | Status: AC
  Filled 2023-03-09: qty 1

## 2023-03-09 MED ORDER — LACTATED RINGERS IV SOLN: INTRAVENOUS | Status: DC

## 2023-03-09 MED ORDER — PROPOFOL 500 MG/50ML IV EMUL
INTRAVENOUS | Status: DC | PRN
  Administered 2023-03-09: 150 ug/kg/min via INTRAVENOUS

## 2023-03-09 MED ORDER — PROPOFOL 10 MG/ML IV BOLUS
INTRAVENOUS | Status: DC | PRN
  Administered 2023-03-09: 60 mg via INTRAVENOUS

## 2023-03-09 MED ORDER — GLYCOPYRROLATE PF 0.2 MG/ML IJ SOSY
PREFILLED_SYRINGE | INTRAMUSCULAR | Status: DC | PRN
  Administered 2023-03-09: .2 mg via INTRAVENOUS

## 2023-03-09 NOTE — Anesthesia Preprocedure Evaluation (Signed)
Anesthesia Evaluation  Patient identified by MRN, date of birth, ID band Patient awake    Reviewed: Allergy & Precautions, H&P , NPO status , Patient's Chart, lab work & pertinent test results, reviewed documented beta blocker date and time   Airway Mallampati: II  TM Distance: >3 FB Neck ROM: full    Dental no notable dental hx.    Pulmonary neg pulmonary ROS, former smoker   Pulmonary exam normal breath sounds clear to auscultation       Cardiovascular Exercise Tolerance: Good negative cardio ROS  Rhythm:regular Rate:Normal     Neuro/Psych  Headaches negative neurological ROS  negative psych ROS   GI/Hepatic negative GI ROS, Neg liver ROS,,,  Endo/Other  negative endocrine ROS    Renal/GU negative Renal ROS  negative genitourinary   Musculoskeletal   Abdominal   Peds  Hematology negative hematology ROS (+)   Anesthesia Other Findings   Reproductive/Obstetrics negative OB ROS                             Anesthesia Physical Anesthesia Plan  ASA: 2  Anesthesia Plan: General   Post-op Pain Management:    Induction:   PONV Risk Score and Plan: Propofol infusion  Airway Management Planned:   Additional Equipment:   Intra-op Plan:   Post-operative Plan:   Informed Consent: I have reviewed the patients History and Physical, chart, labs and discussed the procedure including the risks, benefits and alternatives for the proposed anesthesia with the patient or authorized representative who has indicated his/her understanding and acceptance.     Dental Advisory Given  Plan Discussed with: CRNA  Anesthesia Plan Comments:        Anesthesia Quick Evaluation

## 2023-03-09 NOTE — H&P (Signed)
@LOGO @   Primary Care Physician:  Elfredia Nevins, MD Primary Gastroenterologist:  Dr. Jena Gauss  Pre-Procedure History & Physical: HPI:  Jasmine Fox is a 62 y.o. female is here for a screening colonoscopy.   Negative colonoscopy 2014 chronic constipation managed with Trulance.  Past Medical History:  Diagnosis Date   Constipation    Migraine    none since 2013   Urolithiasis     Past Surgical History:  Procedure Laterality Date   BREAST BIOPSY Left 08/23/2008   CATARACT EXTRACTION Left 07/27/2018   COLONOSCOPY N/A 11/10/2012   Procedure: COLONOSCOPY;  Surgeon: Corbin Ade, MD;  Location: [facility];  Service: Endoscopy;  Laterality: N/A;  11:15   ESOPHAGOGASTRODUODENOSCOPY  02/18/2002   WUJ:WJXBJY esophagus/s/p 54 French Maloney dilator   KIDNEY STONE SURGERY     lt bunion surgery      Prior to Admission medications   Medication Sig Start Date End Date Taking? Authorizing Provider  Plecanatide (TRULANCE) 3 MG TABS Take 1 tablet (3 mg total) by mouth daily. 01/21/23   Tiffany Kocher, PA-C    Allergies as of 02/04/2023   (No Known Allergies)    Family History  Problem Relation Age of Onset   Heart disease Mother        congestive heart failure   Diabetes Father    Hypertension Father    Colonic polyp Father    Cancer Brother        brain cancer   Hypertension Brother    Colon cancer Neg Hx     Social History   Socioeconomic History   Marital status: Married    Spouse name: Not on file   Number of children: 0   Years of education: Not on file   Highest education level: Not on file  Occupational History    Employer: [employer]  Tobacco Use   Smoking status: Former   Smokeless tobacco: Never   Tobacco comments:    Only about 2 packs cigarettes in lifetime  Vaping Use   Vaping status: Never Used  Substance and Sexual Activity   Alcohol use: No    Alcohol/week: 0.0 standard drinks of alcohol   Drug use: No   Sexual activity: Not Currently    Birth  control/protection: Post-menopausal  Other Topics Concern   Not on file  Social History Narrative   Not on file   Social Determinants of Health   Financial Resource Strain: Not on file  Food Insecurity: Not on file  Transportation Needs: Not on file  Physical Activity: Not on file  Stress: Not on file  Social Connections: Not on file  Intimate Partner Violence: Not on file    Review of Systems: See HPI, otherwise negative ROS  Physical Exam: BP 114/72   Pulse 67   Temp 97.7 F (36.5 C) (Oral)   Resp (!) 22   Ht 5\' 4"  (1.626 m)   Wt 50.3 kg   SpO2 99%   BMI 19.05 kg/m  General:   Alert,  Well-developed, well-nourished, pleasant and cooperative in NAD Lungs:  Clear throughout to auscultation.   No wheezes, crackles, or rhonchi. No acute distress. Heart:  Regular rate and rhythm; no murmurs, clicks, rubs,  or gallops. Abdomen:  Soft, nontender and nondistended. No masses, hepatosplenomegaly or hernias noted. Normal bowel sounds, without guarding, and without rebound.   Impression/Plan: [name] is now here to undergo a screening colonoscopy.   Negative colonoscopy 10 years ago.  Family history of  polyps in father but at advanced age.  She remains in the average risk pool.  Risks, benefits, limitations, imponderables and alternatives regarding colonoscopy have been reviewed with the patient. Questions have been answered. All parties agreeable.     Notice:  This dictation was prepared with Dragon dictation along with smaller phrase technology. Any transcriptional errors that result from this process are unintentional and may not be corrected upon review.

## 2023-03-09 NOTE — Discharge Instructions (Signed)
  Colonoscopy Discharge Instructions  Read the instructions outlined below and refer to this sheet in the next few weeks. These discharge instructions provide you with general information on caring for yourself after you leave the hospital. Your doctor may also give you specific instructions. While your treatment has been planned according to the most current medical practices available, unavoidable complications occasionally occur. If you have any problems or questions after discharge, call Dr. Jena Gauss at [phone]. ACTIVITY You may resume your regular activity, but move at a slower pace for the next 24 hours.  Take frequent rest periods for the next 24 hours.  Walking will help get rid of the air and reduce the bloated feeling in your belly (abdomen).  No driving for 24 hours (because of the medicine (anesthesia) used during the test).   Do not sign any important legal documents or operate any machinery for 24 hours (because of the anesthesia used during the test).  NUTRITION Drink plenty of fluids.  You may resume your normal diet as instructed by your doctor.  Begin with a light meal and progress to your normal diet. Heavy or fried foods are harder to digest and may make you feel sick to your stomach (nauseated).  Avoid alcoholic beverages for 24 hours or as instructed.  MEDICATIONS You may resume your normal medications unless your doctor tells you otherwise.  WHAT YOU CAN EXPECT TODAY Some feelings of bloating in the abdomen.  Passage of more gas than usual.  Spotting of blood in your stool or on the toilet paper.  IF YOU HAD POLYPS REMOVED DURING THE COLONOSCOPY: No aspirin products for 7 days or as instructed.  No alcohol for 7 days or as instructed.  Eat a soft diet for the next 24 hours.  FINDING OUT THE RESULTS OF YOUR TEST Not all test results are available during your visit. If your test results are not back during the visit, make an appointment with your caregiver to find out the  results. Do not assume everything is normal if you have not heard from your caregiver or the medical facility. It is important for you to follow up on all of your test results.  SEEK IMMEDIATE MEDICAL ATTENTION IF: You have more than a spotting of blood in your stool.  Your belly is swollen (abdominal distention).  You are nauseated or vomiting.  You have a temperature over 101.  You have abdominal pain or discomfort that is severe or gets worse throughout the day.       1 polyp removed from your colon  Further recommendations to follow pending review of pathology report   recommend utilizing over-the-counter laxative  of choice to manage constipation  Office visit with [place] in 3 months.    At patient request, I called Laylani Plantz at [phone] -  reviewed findings and recommendations

## 2023-03-09 NOTE — Op Note (Signed)
[facility] Patient Name: Jasmine Fox Procedure Date: 03/09/2023 11:20 AM MRN: [id number] Date of Birth: [birth year] Attending MD: Gennette Pac , MD, [id number] CSN: [id number] Age: 62 Admit Type: Outpatient Procedure:                Colonoscopy Indications:              Screening for colorectal malignant neoplasm Providers:                Gennette Pac, MD, Nena Polio, RN, Elinor Parkinson Referring MD:              Medicines:                Propofol per Anesthesia Complications:            No immediate complications. Estimated Blood Loss:     Estimated blood loss was minimal. Procedure:                Pre-Anesthesia Assessment:                           - Prior to the procedure, a History and Physical                            was performed, and patient medications and                            allergies were reviewed. The patient's tolerance of                            previous anesthesia was also reviewed. The risks                            and benefits of the procedure and the sedation                            options and risks were discussed with the patient.                            All questions were answered, and informed consent                            was obtained. Prior Anticoagulants: The patient has                            taken no anticoagulant or antiplatelet agents. ASA                            Grade Assessment: II - A patient with mild systemic                            disease. After reviewing the risks and benefits,  the patient was deemed in satisfactory condition to                            undergo the procedure.                           After obtaining informed consent, the colonoscope                            was passed under direct vision. Throughout the                            procedure, the patient's blood pressure, pulse, and                            oxygen  saturations were monitored continuously. The                            [id number]) scope was introduced through                            the anus and advanced to the the cecum, identified                            by appendiceal orifice and ileocecal valve. The                            colonoscopy was performed without difficulty. The                            patient tolerated the procedure well. The quality                            of the bowel preparation was adequate. The                            ileocecal valve, appendiceal orifice, and rectum                            were photographed. Scope In: 12:06:18 PM Scope Out: 12:28:53 PM Scope Withdrawal Time: 0 hours 15 minutes 23 seconds  Total Procedure Duration: 0 hours 22 minutes 35 seconds  Findings:      An 8 mm polyp was found in the ascending colon. The polyp was       multi-lobulated. The polyp was removed with a cold snare. Resection and       retrieval were complete. Estimated blood loss was minimal.      The exam was otherwise without abnormality on direct and retroflexion       views. Colon was redundant requiring external abdominal pressure to       reach the cecum. Impression:               - One 8 mm polyp in the ascending colon, removed  with a cold snare. Resected and retrieved.                            Redundant colon.                           - The examination was otherwise normal on direct                            and retroflexion views. Patient found Linzess and                            Trulance too expensive. Moderate Sedation:      Moderate (conscious) sedation was personally administered by an       anesthesia professional. The following parameters were monitored: oxygen       saturation, heart rate, blood pressure, respiratory rate, EKG, adequacy       of pulmonary ventilation, and response to care. Recommendation:           - Patient has a contact number  available for                            emergencies. The signs and symptoms of potential                            delayed complications were discussed with the                            patient. Return to normal activities tomorrow.                            Written discharge instructions were provided to the                            patient.                           - Advance diet as tolerated.                           - Continue present medications.                           - Repeat colonoscopy date to be determined after                            pending pathology results are reviewed for                            surveillance.                           - Return to GI office in 3 months. Over-the-counter                            laxative of choice to manage constipation Procedure Code(s):        ---  Professional ---                           [id number], Colonoscopy, flexible; with removal of                            tumor(s), polyp(s), or other lesion(s) by snare                            technique Diagnosis Code(s):        --- Professional ---                           Z12.11, Encounter for screening for malignant                            neoplasm of colon                           D12.2, Benign neoplasm of ascending colon CPT copyright 2022 [employer]. All rights reserved. The codes documented in this report are preliminary and upon coder review may  be revised to meet current compliance requirements. Gerrit Friends. Jakalyn Kratky, MD Gennette Pac, MD 03/09/2023 12:37:43 PM This report has been signed electronically. Number of Addenda: 0

## 2023-03-09 NOTE — Transfer of Care (Signed)
Immediate Anesthesia Transfer of Care Note  Patient: Jasmine Fox  Procedure(s) Performed: COLONOSCOPY WITH PROPOFOL POLYPECTOMY INTESTINAL  Patient Location: Endoscopy Unit  Anesthesia Type:General  Level of Consciousness: awake, alert , and oriented  Airway & Oxygen Therapy: Patient Spontanous Breathing  Post-op Assessment: Report given to RN, Post -op Vital signs reviewed and stable, Patient moving all extremities X 4, and Patient able to stick tongue midline  Post vital signs: Reviewed and stable  Last Vitals:  Vitals Value Taken Time  BP 96/55   Temp 36.4 C 03/09/23 1232  Pulse 76 03/09/23 1232  Resp 18 03/09/23 1232  SpO2 100 % 03/09/23 1232    Last Pain:  Vitals:   03/09/23 1232  TempSrc: Oral  PainSc:       Patients Stated Pain Goal: 7 (03/09/23 1116)  Complications: No notable events documented.

## 2023-03-10 LAB — SURGICAL PATHOLOGY

## 2023-03-11 ENCOUNTER — Encounter: Payer: Self-pay | Admitting: Internal Medicine

## 2023-03-11 DIAGNOSIS — M9901 Segmental and somatic dysfunction of cervical region: Secondary | ICD-10-CM | POA: Diagnosis not present

## 2023-03-11 DIAGNOSIS — M5033 Other cervical disc degeneration, cervicothoracic region: Secondary | ICD-10-CM | POA: Diagnosis not present

## 2023-03-14 NOTE — Anesthesia Postprocedure Evaluation (Signed)
Anesthesia Post Note  Patient: Jasmine Fox  Procedure(s) Performed: COLONOSCOPY WITH PROPOFOL POLYPECTOMY INTESTINAL  Patient location during evaluation: Phase II Anesthesia Type: General Level of consciousness: awake Pain management: pain level controlled Vital Signs Assessment: post-procedure vital signs reviewed and stable Respiratory status: spontaneous breathing and respiratory function stable Cardiovascular status: blood pressure returned to baseline and stable Postop Assessment: no headache and no apparent nausea or vomiting Anesthetic complications: no Comments: Late entry   No notable events documented.   Last Vitals:  Vitals:   03/09/23 1116 03/09/23 1232  BP: 114/72 (!) 96/55  Pulse: 67 76  Resp: (!) 22 18  Temp: 36.5 C (!) 36.4 C  SpO2: 99% 100%    Last Pain:  Vitals:   03/09/23 1232  TempSrc: Oral  PainSc: 0-No pain                 Windell Norfolk

## 2023-03-18 ENCOUNTER — Other Ambulatory Visit: Payer: BC Managed Care – PPO | Admitting: Adult Health

## 2023-03-18 DIAGNOSIS — M9901 Segmental and somatic dysfunction of cervical region: Secondary | ICD-10-CM | POA: Diagnosis not present

## 2023-03-18 DIAGNOSIS — M5033 Other cervical disc degeneration, cervicothoracic region: Secondary | ICD-10-CM | POA: Diagnosis not present

## 2023-03-20 ENCOUNTER — Encounter (HOSPITAL_COMMUNITY): Payer: Self-pay | Admitting: Internal Medicine

## 2023-03-25 DIAGNOSIS — M5033 Other cervical disc degeneration, cervicothoracic region: Secondary | ICD-10-CM | POA: Diagnosis not present

## 2023-03-25 DIAGNOSIS — M9901 Segmental and somatic dysfunction of cervical region: Secondary | ICD-10-CM | POA: Diagnosis not present

## 2023-03-30 DIAGNOSIS — M5033 Other cervical disc degeneration, cervicothoracic region: Secondary | ICD-10-CM | POA: Diagnosis not present

## 2023-03-30 DIAGNOSIS — M9901 Segmental and somatic dysfunction of cervical region: Secondary | ICD-10-CM | POA: Diagnosis not present

## 2023-04-01 DIAGNOSIS — M5033 Other cervical disc degeneration, cervicothoracic region: Secondary | ICD-10-CM | POA: Diagnosis not present

## 2023-04-01 DIAGNOSIS — M9901 Segmental and somatic dysfunction of cervical region: Secondary | ICD-10-CM | POA: Diagnosis not present

## 2023-04-08 DIAGNOSIS — M9901 Segmental and somatic dysfunction of cervical region: Secondary | ICD-10-CM | POA: Diagnosis not present

## 2023-04-08 DIAGNOSIS — M5033 Other cervical disc degeneration, cervicothoracic region: Secondary | ICD-10-CM | POA: Diagnosis not present

## 2023-04-15 DIAGNOSIS — M5033 Other cervical disc degeneration, cervicothoracic region: Secondary | ICD-10-CM | POA: Diagnosis not present

## 2023-04-15 DIAGNOSIS — M9901 Segmental and somatic dysfunction of cervical region: Secondary | ICD-10-CM | POA: Diagnosis not present

## 2023-04-22 DIAGNOSIS — M5033 Other cervical disc degeneration, cervicothoracic region: Secondary | ICD-10-CM | POA: Diagnosis not present

## 2023-04-22 DIAGNOSIS — M9901 Segmental and somatic dysfunction of cervical region: Secondary | ICD-10-CM | POA: Diagnosis not present

## 2023-04-29 DIAGNOSIS — M5033 Other cervical disc degeneration, cervicothoracic region: Secondary | ICD-10-CM | POA: Diagnosis not present

## 2023-04-29 DIAGNOSIS — M9901 Segmental and somatic dysfunction of cervical region: Secondary | ICD-10-CM | POA: Diagnosis not present

## 2023-05-05 ENCOUNTER — Encounter: Payer: Self-pay | Admitting: Gastroenterology

## 2023-05-06 DIAGNOSIS — M5033 Other cervical disc degeneration, cervicothoracic region: Secondary | ICD-10-CM | POA: Diagnosis not present

## 2023-05-06 DIAGNOSIS — M9901 Segmental and somatic dysfunction of cervical region: Secondary | ICD-10-CM | POA: Diagnosis not present

## 2023-05-13 DIAGNOSIS — M5033 Other cervical disc degeneration, cervicothoracic region: Secondary | ICD-10-CM | POA: Diagnosis not present

## 2023-05-13 DIAGNOSIS — M9901 Segmental and somatic dysfunction of cervical region: Secondary | ICD-10-CM | POA: Diagnosis not present

## 2023-05-18 DIAGNOSIS — M9901 Segmental and somatic dysfunction of cervical region: Secondary | ICD-10-CM | POA: Diagnosis not present

## 2023-05-18 DIAGNOSIS — M5033 Other cervical disc degeneration, cervicothoracic region: Secondary | ICD-10-CM | POA: Diagnosis not present

## 2023-05-27 ENCOUNTER — Ambulatory Visit: Payer: BC Managed Care – PPO | Admitting: Gastroenterology

## 2023-05-27 DIAGNOSIS — M9901 Segmental and somatic dysfunction of cervical region: Secondary | ICD-10-CM | POA: Diagnosis not present

## 2023-05-27 DIAGNOSIS — M5033 Other cervical disc degeneration, cervicothoracic region: Secondary | ICD-10-CM | POA: Diagnosis not present

## 2023-06-01 DIAGNOSIS — M9901 Segmental and somatic dysfunction of cervical region: Secondary | ICD-10-CM | POA: Diagnosis not present

## 2023-06-01 DIAGNOSIS — M5033 Other cervical disc degeneration, cervicothoracic region: Secondary | ICD-10-CM | POA: Diagnosis not present

## 2023-06-05 ENCOUNTER — Other Ambulatory Visit (HOSPITAL_COMMUNITY): Payer: Self-pay | Admitting: Internal Medicine

## 2023-06-05 DIAGNOSIS — Z1389 Encounter for screening for other disorder: Secondary | ICD-10-CM | POA: Diagnosis not present

## 2023-06-05 DIAGNOSIS — Z1331 Encounter for screening for depression: Secondary | ICD-10-CM | POA: Diagnosis not present

## 2023-06-05 DIAGNOSIS — Z1231 Encounter for screening mammogram for malignant neoplasm of breast: Secondary | ICD-10-CM

## 2023-06-05 DIAGNOSIS — Z0001 Encounter for general adult medical examination with abnormal findings: Secondary | ICD-10-CM | POA: Diagnosis not present

## 2023-06-05 DIAGNOSIS — Z681 Body mass index (BMI) 19 or less, adult: Secondary | ICD-10-CM | POA: Diagnosis not present

## 2023-06-05 DIAGNOSIS — Z9229 Personal history of other drug therapy: Secondary | ICD-10-CM | POA: Diagnosis not present

## 2023-06-08 DIAGNOSIS — H35341 Macular cyst, hole, or pseudohole, right eye: Secondary | ICD-10-CM | POA: Diagnosis not present

## 2023-06-10 ENCOUNTER — Ambulatory Visit (HOSPITAL_COMMUNITY)
Admission: RE | Admit: 2023-06-10 | Discharge: 2023-06-10 | Disposition: A | Discharge status: Home / Self Care | Payer: BC Managed Care – PPO | Source: Ambulatory Visit | Attending: Internal Medicine | Admitting: Internal Medicine

## 2023-06-10 ENCOUNTER — Encounter (HOSPITAL_COMMUNITY): Payer: Self-pay

## 2023-06-10 DIAGNOSIS — Z1231 Encounter for screening mammogram for malignant neoplasm of breast: Secondary | ICD-10-CM | POA: Diagnosis not present

## 2023-06-10 DIAGNOSIS — M5033 Other cervical disc degeneration, cervicothoracic region: Secondary | ICD-10-CM | POA: Diagnosis not present

## 2023-06-10 DIAGNOSIS — M9901 Segmental and somatic dysfunction of cervical region: Secondary | ICD-10-CM | POA: Diagnosis not present

## 2023-06-22 DIAGNOSIS — M5033 Other cervical disc degeneration, cervicothoracic region: Secondary | ICD-10-CM | POA: Diagnosis not present

## 2023-06-22 DIAGNOSIS — M9901 Segmental and somatic dysfunction of cervical region: Secondary | ICD-10-CM | POA: Diagnosis not present

## 2023-06-24 NOTE — Progress Notes (Signed)
 [facility] - Clinic Note  06/29/2023   CHIEF COMPLAINT Patient presents for Retina Evaluation  HISTORY OF PRESENT ILLNESS: Jasmine Fox is a 63 y.o. female who presents to the clinic today for:  HPI     Retina Evaluation   In right eye.  This started weeks ago.  Associated Symptoms Floaters and Distortion.  Negative for Flashes.  I, the attending physician,  performed the HPI with the patient and updated documentation appropriately.        Comments   Patient is here today based on a referral from Dr. FABIENE Fox for a ERM/hole OD. She states that the vision in the right eye is wavy. This is been going on about a few months ago. She is using AT's      Last edited by Jasmine Rogue, MD on 06/29/2023 11:47 PM.    Pt is here on the referral of Dr. Moats for concern of ERM OD, pt states Dr. Moats told her she sees a film on the back of her right lens, she states she was also told she has a hole forming in her retina, as well as chronic dry eyes, pt states when she takes her glasses off, straight lines look wavy, she states this has been going on since late November, pt denies being diabetic or hypertensive   Referring physician: Willma Fox, OD [address] [place] [place],  [place] [address]  HISTORICAL INFORMATION:  Selected notes from the MEDICAL RECORD NUMBER Referred by Dr. Willma Fox for ERM / mac hole OD LEE:  Ocular Hx- PMH-   CURRENT MEDICATIONS: No current outpatient medications on file. (Ophthalmic Drugs)   No current facility-administered medications for this visit. (Ophthalmic Drugs)   Current Outpatient Medications (Other)  Medication Sig   Plecanatide  (TRULANCE ) 3 MG TABS Take 1 tablet (3 mg total) by mouth daily.   No current facility-administered medications for this visit. (Other)   REVIEW OF SYSTEMS: ROS   Positive for: Eyes Last edited by Jasmine Fox, COT on 06/29/2023  1:53 PM.     ALLERGIES No Known Allergies PAST  MEDICAL HISTORY Past Medical History:  Diagnosis Date   Constipation    Migraine    none since 2013   Urolithiasis    Past Surgical History:  Procedure Laterality Date   BREAST BIOPSY Left 08/23/2008   DUCTAL PAPILLOMA WITH CALCIFICATIONS/ FIBROCYSTIC CHANGES AND CALCIFICATIONS   CATARACT EXTRACTION Left 07/27/2018   COLONOSCOPY N/A 11/10/2012   Procedure: COLONOSCOPY;  Surgeon: Jasmine Fox Hollingshead, MD;  [facility];  Service: Endoscopy;  Laterality: N/A;  11:15   COLONOSCOPY WITH PROPOFOL  N/A 03/09/2023   Procedure: COLONOSCOPY WITH PROPOFOL ;  Surgeon: Fox Jasmine CHRISTELLA, MD;  [facility];  Service: Endoscopy;  Laterality: N/A;  9:30 AM, ASA 2   ESOPHAGOGASTRODUODENOSCOPY  02/18/2002   MFM:Wnmfjo esophagus/s/p 54 French Maloney dilator   KIDNEY STONE SURGERY     lt bunion surgery     POLYPECTOMY  03/09/2023   Procedure: POLYPECTOMY INTESTINAL;  Surgeon: Fox Jasmine CHRISTELLA, MD;  [facility];  Service: Endoscopy;;   FAMILY HISTORY Family History  Problem Relation Age of Onset   Heart disease Mother        congestive heart failure   Diabetes Father    Hypertension Father    Colonic polyp Father    Cancer Brother        brain cancer   Hypertension Brother  Colon cancer Neg Hx    SOCIAL HISTORY Social History   Tobacco Use   Smoking status: Former   Smokeless tobacco: Never   Tobacco comments:    Only about 2 packs cigarettes in lifetime  Vaping Use   Vaping status: Never Used  Substance Use Topics   Alcohol use: No    Alcohol/week: 0.0 standard drinks of alcohol   Drug use: No       OPHTHALMIC EXAM:  Base Eye Exam     Visual Acuity (Snellen - Linear)       Right Left   Dist cc 20/60 +2 20/20   Dist ph cc NI          Tonometry (Tonopen, 1:56 PM)       Right Left   Pressure 17 16         Pupils       Dark Light Shape React APD   Right 4 3 Round Brisk None   Left 4 3 Round Brisk None         Visual Fields        Left Right    Full Full         Extraocular Movement       Right Left    Full, Ortho Full, Ortho         Neuro/Psych     Oriented x3: Yes         Dilation     Both eyes: 1.0% Mydriacyl , 2.5% Phenylephrine  @ 1:55 PM           Slit Lamp and Fundus Exam     Slit Lamp Exam       Right Left   Lids/Lashes Dermatochalasis - upper lid Dermatochalasis - upper lid   Conjunctiva/Sclera White and quiet White and quiet   Cornea mild arcus trace PEE   Anterior Chamber deep and clear deep and clear   Iris Round and dilated Round and dilated   Lens PC IOL in good position, 1-2+ Posterior capsular opacification PC IOL in good position   Anterior Vitreous syneresis, Posterior vitreous detachment syneresis         Fundus Exam       Right Left   Disc mild Pallor, Sharp rim Pink and Sharp, mild PPA   C/D Ratio 0.2 0.4   Macula Blunted foveal reflex, ERM with central thickening and striae, no heme Flat, Blunted foveal reflex, RPE mottling and clumping, fine drusen, No heme or edema   Vessels mild attenuation, mild tortuosity attenuated, mild tortuosity   Periphery Attached, mild reticular degeneration, No heme Attached, No heme           Refraction     Wearing Rx       Sphere Cylinder Axis Add   Right [place] +0.25 100 +2.50   Left -2.50 +0.50 097 +2.50           IMAGING AND PROCEDURES  Imaging and Procedures for 06/29/2023  OCT, Retina - OU - Both Eyes       Right Eye Quality was good. Central Foveal Thickness: 523. Progression has no prior data. Findings include no SRF, abnormal foveal contour, epiretinal membrane, intraretinal fluid, macular pucker (ERM with central retinal thickening and central cystic changes).   Left Eye Quality was good. Central Foveal Thickness: 297. Progression has no prior data. Findings include normal foveal contour, no IRF, no SRF, retinal drusen (Focal central druse, trace ERM).   Notes *Images captured and stored on  drive  Diagnosis / Impression:  OD: ERM with central retinal thickening and central cystic changes OS: Focal central druse, trace ERM  Clinical management:  See below  Abbreviations: NFP - Normal foveal profile. CME - cystoid macular edema. PED - pigment epithelial detachment. IRF - intraretinal fluid. SRF - subretinal fluid. EZ - ellipsoid zone. ERM - epiretinal membrane. ORA - outer retinal atrophy. ORT - outer retinal tubulation. SRHM - subretinal hyper-reflective material. IRHM - intraretinal hyper-reflective material      Yag Capsulotomy - OD - Right Eye       Procedure note: YAG Capsulotomy, RIGHT Eye  Informed consent obtained. Pre-op dilating drops (1% Topicamide and 2.5% Phenylephrine ), and topical anesthesia given. Power: 7.1 mJ Shots: 11 Posterior capsulotomy in cruciate formation performed without difficulty. Patient tolerated procedure well. No complications. Rx pred forte  4 times a day for 7 days, then stop. Pt received written and verbal post laser education. Recheck in 4 weeks w/ dilated exam.           ASSESSMENT/PLAN:   ICD-10-CM   1. Epiretinal membrane (ERM) of right eye  H35.371 OCT, Retina - OU - Both Eyes    2. Pseudophakia, both eyes  Z96.1     3. PCO (posterior capsular opacification), right  H26.491 Yag Capsulotomy - OD - Right Eye     Epiretinal membrane, right eye  - The natural history, anatomy, potential for loss of vision, and treatment options including vitrectomy techniques and the complications of endophthalmitis, retinal detachment, vitreous hemorrhage, cataract progression and permanent vision loss discussed with the patient. - ERM w/ central retinal thickening and central cystic - BCVA 20/60 - +metamorphopsia - will likely benefit from PPV w/ ERM peel +/- gas OD - monitor for now - f/u 4 weeks -- DFE/OCT, possible surgical planning  2. Pseudophakia OU  - s/p CE/IOL (Dr. Cleatus, 2020)  - IOL in good position, doing well  -  monitor  3. PCO OD  - recommend Yag Cap OD today, 01.06.25  - pt wishes to proceed with laser  - RBA of procedure discussed, questions answered - informed consent obtained and signed - see procedure note - start PF QID OD x7 days - f/u 4 weeks, DFE, OCT  Ophthalmic Meds Ordered this visit:  No orders of the defined types were placed in this encounter.    Return in about 4 weeks (around 07/27/2023) for f/u 4 weeks, ERM OD, DFE, OCT.  There are no Patient Instructions on file for this visit.  Explained the diagnoses, plan, and follow up with the patient and they expressed understanding.  Patient expressed understanding of the importance of proper follow up care.   This document serves as a record of services personally performed by Redell JUDITHANN Hans, MD, PhD. It was created on their behalf by Alan PARAS. Delores, OA an ophthalmic technician. The creation of this record is the provider's dictation and/or activities during the visit.    Electronically signed by: Alan PARAS. Delores, OA 06/29/23 11:48 PM  Redell JUDITHANN Hans, M.D., Ph.D. Diseases & Surgery of the Retina and Vitreous [facility] 06/29/2023  I have reviewed the above documentation for accuracy and completeness, and I agree with the above. Redell JUDITHANN Hans, M.D., Ph.D. 06/29/23 11:50 PM   Abbreviations: M myopia (nearsighted); A astigmatism; H hyperopia (farsighted); P presbyopia; Mrx spectacle prescription;  CTL contact lenses; OD right eye; OS left eye; OU both eyes  XT exotropia; ET esotropia; PEK punctate epithelial  keratitis; PEE punctate epithelial erosions; DES dry eye syndrome; MGD meibomian gland dysfunction; ATs artificial tears; PFAT's preservative free artificial tears; NSC nuclear sclerotic cataract; PSC posterior subcapsular cataract; ERM epi-retinal membrane; PVD posterior vitreous detachment; RD retinal detachment; DM diabetes mellitus; DR diabetic retinopathy; NPDR non-proliferative diabetic  retinopathy; PDR proliferative diabetic retinopathy; CSME clinically significant macular edema; DME diabetic macular edema; dbh dot blot hemorrhages; CWS cotton wool spot; POAG primary open angle glaucoma; C/D cup-to-disc ratio; HVF humphrey visual field; GVF goldmann visual field; OCT optical coherence tomography; IOP intraocular pressure; BRVO Branch retinal vein occlusion; CRVO central retinal vein occlusion; CRAO central retinal artery occlusion; BRAO branch retinal artery occlusion; RT retinal tear; SB scleral buckle; PPV pars plana vitrectomy; VH Vitreous hemorrhage; PRP panretinal laser photocoagulation; IVK intravitreal kenalog ; VMT vitreomacular traction; MH Macular hole;  NVD neovascularization of the disc; NVE neovascularization elsewhere; AREDS age related eye disease study; ARMD age related macular degeneration; POAG primary open angle glaucoma; EBMD epithelial/anterior basement membrane dystrophy; ACIOL anterior chamber intraocular lens; IOL intraocular lens; PCIOL posterior chamber intraocular lens; Phaco/IOL phacoemulsification with intraocular lens placement; PRK photorefractive keratectomy; LASIK laser assisted in situ keratomileusis; HTN hypertension; DM diabetes mellitus; COPD chronic obstructive pulmonary disease

## 2023-06-26 ENCOUNTER — Ambulatory Visit: Payer: BC Managed Care – PPO | Admitting: Gastroenterology

## 2023-06-29 ENCOUNTER — Ambulatory Visit (INDEPENDENT_AMBULATORY_CARE_PROVIDER_SITE_OTHER): Payer: BC Managed Care – PPO | Admitting: Ophthalmology

## 2023-06-29 ENCOUNTER — Encounter (INDEPENDENT_AMBULATORY_CARE_PROVIDER_SITE_OTHER): Payer: Self-pay | Admitting: Ophthalmology

## 2023-06-29 DIAGNOSIS — H3581 Retinal edema: Secondary | ICD-10-CM

## 2023-06-29 DIAGNOSIS — Z961 Presence of intraocular lens: Secondary | ICD-10-CM | POA: Diagnosis not present

## 2023-06-29 DIAGNOSIS — H26491 Other secondary cataract, right eye: Secondary | ICD-10-CM

## 2023-06-29 DIAGNOSIS — H35371 Puckering of macula, right eye: Secondary | ICD-10-CM | POA: Diagnosis not present

## 2023-06-29 MED ORDER — PREDNISOLONE ACETATE 1 % OP SUSP: 1.0000 [drp] | Freq: Four times a day (QID) | OPHTHALMIC | 0 refills | Status: AC

## 2023-06-30 ENCOUNTER — Other Ambulatory Visit (INDEPENDENT_AMBULATORY_CARE_PROVIDER_SITE_OTHER): Payer: Self-pay

## 2023-06-30 MED ORDER — PREDNISOLONE ACETATE 1 % OP SUSP: 1.0000 [drp] | Freq: Four times a day (QID) | OPHTHALMIC | 0 refills | Status: DC

## 2023-07-01 DIAGNOSIS — M9901 Segmental and somatic dysfunction of cervical region: Secondary | ICD-10-CM | POA: Diagnosis not present

## 2023-07-01 DIAGNOSIS — M5033 Other cervical disc degeneration, cervicothoracic region: Secondary | ICD-10-CM | POA: Diagnosis not present

## 2023-07-15 DIAGNOSIS — M9901 Segmental and somatic dysfunction of cervical region: Secondary | ICD-10-CM | POA: Diagnosis not present

## 2023-07-15 DIAGNOSIS — M5033 Other cervical disc degeneration, cervicothoracic region: Secondary | ICD-10-CM | POA: Diagnosis not present

## 2023-07-15 NOTE — Progress Notes (Signed)
[facility] - Clinic Note  07/27/2023   CHIEF COMPLAINT Patient presents for Retina Follow Up  HISTORY OF PRESENT ILLNESS: Jasmine Fox is a 63 y.o. female who presents to the clinic today for:  HPI     Retina Follow Up   Patient presents with  Other.  In right eye.  Severity is moderate.  Duration of 4 weeks.  Since onset it is stable.  I, the attending physician,  performed the HPI with the patient and updated documentation appropriately.      Last edited by Rennis Chris, MD on 07/27/2023 12:14 PM.    Pt states she has noticed colors are brighter after the Yag procedure, but she hasn't noticed any change in vision   Referring physician: Desiree Lucy, OD [address] [place] [place],  [place] [address]  HISTORICAL INFORMATION:  Selected notes from the MEDICAL RECORD NUMBER Referred by Dr. Desiree Lucy for ERM / mac hole OD LEE:  Ocular Hx- PMH-   CURRENT MEDICATIONS: Current Outpatient Medications (Ophthalmic Drugs)  Medication Sig   prednisoLONE acetate (PRED FORTE) 1 % ophthalmic suspension Place 1 drop into the right eye 4 (four) times daily.   No current facility-administered medications for this visit. (Ophthalmic Drugs)   Current Outpatient Medications (Other)  Medication Sig   Plecanatide (TRULANCE) 3 MG TABS Take 1 tablet (3 mg total) by mouth daily.   No current facility-administered medications for this visit. (Other)   REVIEW OF SYSTEMS: ROS   Positive for: Eyes Negative for: Constitutional, Gastrointestinal, Neurological, Skin, Genitourinary, Musculoskeletal, HENT, Endocrine, Cardiovascular, Respiratory, Psychiatric, Allergic/Imm, Heme/Lymph Last edited by Annalee Genta D, COT on 07/27/2023  7:37 AM.     ALLERGIES No Known Allergies PAST MEDICAL HISTORY Past Medical History:  Diagnosis Date   Constipation    Migraine    none since 2013   Urolithiasis    Past Surgical History:  Procedure Laterality Date   BREAST BIOPSY Left  08/23/2008   DUCTAL PAPILLOMA WITH CALCIFICATIONS/ FIBROCYSTIC CHANGES AND CALCIFICATIONS   CATARACT EXTRACTION Left 07/27/2018   COLONOSCOPY N/A 11/10/2012   Procedure: COLONOSCOPY;  Surgeon: Corbin Ade, MD;  [facility];  Service: Endoscopy;  Laterality: N/A;  11:15   COLONOSCOPY WITH PROPOFOL N/A 03/09/2023   Procedure: COLONOSCOPY WITH PROPOFOL;  Surgeon: Corbin Ade, MD;  [facility];  Service: Endoscopy;  Laterality: N/A;  9:30 AM, ASA 2   ESOPHAGOGASTRODUODENOSCOPY  02/18/2002   UEA:[id number] esophagus/s/p 54 French Maloney dilator   KIDNEY STONE SURGERY     lt bunion surgery     POLYPECTOMY  03/09/2023   Procedure: POLYPECTOMY INTESTINAL;  Surgeon: Corbin Ade, MD;  [facility];  Service: Endoscopy;;   FAMILY HISTORY Family History  Problem Relation Age of Onset   Heart disease Mother        congestive heart failure   Diabetes Father    Hypertension Father    Colonic polyp Father    Cancer Brother        brain cancer   Hypertension Brother    Colon cancer Neg Hx    SOCIAL HISTORY Social History   Tobacco Use   Smoking status: Former   Smokeless tobacco: Never   Tobacco comments:    Only about 2 packs cigarettes in lifetime  Vaping Use   Vaping status: Never Used  Substance Use Topics   Alcohol use: No    Alcohol/week: 0.0 standard drinks of alcohol  Drug use: No       OPHTHALMIC EXAM:  Base Eye Exam     Visual Acuity (Snellen - Linear)       Right Left   Dist cc 20/40 -2 20/20   Dist ph cc NI     Correction: Glasses         Tonometry (Tonopen, 7:42 AM)       Right Left   Pressure 21 22         Pupils       Dark Light Shape React APD   Right 4 3 Round Brisk None   Left 4 3 Round Brisk None         Visual Fields (Counting fingers)       Left Right    Full Full         Extraocular Movement       Right Left    Full, Ortho Full, Ortho         Neuro/Psych     Oriented x3: Yes    Mood/Affect: Normal         Dilation     Both eyes: 1.0% Mydriacyl, 2.5% Phenylephrine @ 7:42 AM           Slit Lamp and Fundus Exam     Slit Lamp Exam       Right Left   Lids/Lashes Dermatochalasis - upper lid Dermatochalasis - upper lid   Conjunctiva/Sclera White and quiet White and quiet   Cornea mild arcus trace PEE   Anterior Chamber deep and clear deep and clear   Iris Round and dilated Round and dilated   Lens PC IOL in good position, open PC PC IOL in good position   Anterior Vitreous syneresis, Posterior vitreous detachment syneresis         Fundus Exam       Right Left   Disc mild Pallor, Sharp rim Pink and Sharp, mild PPA   C/D Ratio 0.4 0.4   Macula Blunted foveal reflex, ERM with central thickening and striae, no heme Flat, Blunted foveal reflex, RPE mottling and clumping, fine drusen, No heme or edema   Vessels attenuated, mild tortuosity attenuated, mild tortuosity   Periphery Attached, mild reticular degeneration, No heme Attached, No heme           IMAGING AND PROCEDURES  Imaging and Procedures for 07/27/2023  OCT, Retina - OU - Both Eyes       Right Eye Quality was good. Central Foveal Thickness: 526. Progression has been stable. Findings include no SRF, abnormal foveal contour, epiretinal membrane, intraretinal fluid, macular pucker (ERM with central retinal thickening and central cystic changes).   Left Eye Quality was good. Central Foveal Thickness: 300. Progression has been stable. Findings include normal foveal contour, no IRF, no SRF, retinal drusen (Focal central druse, trace ERM).   Notes *Images captured and stored on drive  Diagnosis / Impression:  OD: ERM with central retinal thickening and central cystic changes -- stable OS: Focal central druse, trace ERM  Clinical management:  See below  Abbreviations: NFP - Normal foveal profile. CME - cystoid macular edema. PED - pigment epithelial detachment. IRF - intraretinal  fluid. SRF - subretinal fluid. EZ - ellipsoid zone. ERM - epiretinal membrane. ORA - outer retinal atrophy. ORT - outer retinal tubulation. SRHM - subretinal hyper-reflective material. IRHM - intraretinal hyper-reflective material            ASSESSMENT/PLAN:   ICD-10-CM   1. Epiretinal  membrane (ERM) of right eye  H35.371 OCT, Retina - OU - Both Eyes    2. Pseudophakia, both eyes  Z96.1     3. PCO (posterior capsular opacification), right  H26.491      Epiretinal membrane, right eye  - ERM w/ central retinal thickening and central cystic - BCVA improved to 20/40 from 20/60 -- s/p Yag Cap - +metamorphopsia - will likely benefit from PPV w/ ERM peel +/- gas OD - monitor for now - f/u 3-4 months, sooner prn -- DFE/OCT, possible surgical planning  2,3. Pseudophakia OU  - s/p CE/IOL OU (Dr. Elmer Picker, 2020)  - IOLs in good position  - s/p Yag Cap OD (01.06.25) -- good PC opening - BCVA OD 20/40 from 20/60   - monitor  Ophthalmic Meds Ordered this visit:  No orders of the defined types were placed in this encounter.    Return for f/u 3-4 months, ERM OD, DFE, OCT.  There are no Patient Instructions on file for this visit.  Explained the diagnoses, plan, and follow up with the patient and they expressed understanding.  Patient expressed understanding of the importance of proper follow up care.   This document serves as a record of services personally performed by Karie Chimera, MD, PhD. It was created on their behalf by Glee Arvin. Manson Passey, OA an ophthalmic technician. The creation of this record is the provider's dictation and/or activities during the visit.    Electronically signed by: Glee Arvin. Manson Passey, OA 07/27/23 12:16 PM   Karie Chimera, M.D., Ph.D. Diseases & Surgery of the Retina and Vitreous [facility] 07/27/2023  I have reviewed the above documentation for accuracy and completeness, and I agree with the above. Karie Chimera, M.D., Ph.D.  07/27/23 12:17 PM   Abbreviations: M myopia (nearsighted); A astigmatism; H hyperopia (farsighted); P presbyopia; Mrx spectacle prescription;  CTL contact lenses; OD right eye; OS left eye; OU both eyes  XT exotropia; ET esotropia; PEK punctate epithelial keratitis; PEE punctate epithelial erosions; DES dry eye syndrome; MGD meibomian gland dysfunction; ATs artificial tears; PFAT's preservative free artificial tears; NSC nuclear sclerotic cataract; PSC posterior subcapsular cataract; ERM epi-retinal membrane; PVD posterior vitreous detachment; RD retinal detachment; DM diabetes mellitus; DR diabetic retinopathy; NPDR non-proliferative diabetic retinopathy; PDR proliferative diabetic retinopathy; CSME clinically significant macular edema; DME diabetic macular edema; dbh dot blot hemorrhages; CWS cotton wool spot; POAG primary open angle glaucoma; C/D cup-to-disc ratio; HVF humphrey visual field; GVF goldmann visual field; OCT optical coherence tomography; IOP intraocular pressure; BRVO Branch retinal vein occlusion; CRVO central retinal vein occlusion; CRAO central retinal artery occlusion; BRAO branch retinal artery occlusion; RT retinal tear; SB scleral buckle; PPV pars plana vitrectomy; VH Vitreous hemorrhage; PRP panretinal laser photocoagulation; IVK intravitreal kenalog; VMT vitreomacular traction; MH Macular hole;  NVD neovascularization of the disc; NVE neovascularization elsewhere; AREDS age related eye disease study; ARMD age related macular degeneration; POAG primary open angle glaucoma; EBMD epithelial/anterior basement membrane dystrophy; ACIOL anterior chamber intraocular lens; IOL intraocular lens; PCIOL posterior chamber intraocular lens; Phaco/IOL phacoemulsification with intraocular lens placement; PRK photorefractive keratectomy; LASIK laser assisted in situ keratomileusis; HTN hypertension; DM diabetes mellitus; COPD chronic obstructive pulmonary disease

## 2023-07-22 DIAGNOSIS — M9901 Segmental and somatic dysfunction of cervical region: Secondary | ICD-10-CM | POA: Diagnosis not present

## 2023-07-22 DIAGNOSIS — M5033 Other cervical disc degeneration, cervicothoracic region: Secondary | ICD-10-CM | POA: Diagnosis not present

## 2023-07-27 ENCOUNTER — Encounter (INDEPENDENT_AMBULATORY_CARE_PROVIDER_SITE_OTHER): Payer: Self-pay | Admitting: Ophthalmology

## 2023-07-27 ENCOUNTER — Ambulatory Visit (INDEPENDENT_AMBULATORY_CARE_PROVIDER_SITE_OTHER): Payer: BC Managed Care – PPO | Admitting: Ophthalmology

## 2023-07-27 DIAGNOSIS — H35371 Puckering of macula, right eye: Secondary | ICD-10-CM

## 2023-07-27 DIAGNOSIS — H26491 Other secondary cataract, right eye: Secondary | ICD-10-CM

## 2023-07-27 DIAGNOSIS — Z961 Presence of intraocular lens: Secondary | ICD-10-CM

## 2023-07-29 DIAGNOSIS — M5033 Other cervical disc degeneration, cervicothoracic region: Secondary | ICD-10-CM | POA: Diagnosis not present

## 2023-07-29 DIAGNOSIS — M9901 Segmental and somatic dysfunction of cervical region: Secondary | ICD-10-CM | POA: Diagnosis not present

## 2023-08-03 DIAGNOSIS — M9901 Segmental and somatic dysfunction of cervical region: Secondary | ICD-10-CM | POA: Diagnosis not present

## 2023-08-03 DIAGNOSIS — M5033 Other cervical disc degeneration, cervicothoracic region: Secondary | ICD-10-CM | POA: Diagnosis not present

## 2023-08-10 DIAGNOSIS — M9901 Segmental and somatic dysfunction of cervical region: Secondary | ICD-10-CM | POA: Diagnosis not present

## 2023-08-10 DIAGNOSIS — M5033 Other cervical disc degeneration, cervicothoracic region: Secondary | ICD-10-CM | POA: Diagnosis not present

## 2023-08-12 DIAGNOSIS — M5033 Other cervical disc degeneration, cervicothoracic region: Secondary | ICD-10-CM | POA: Diagnosis not present

## 2023-08-12 DIAGNOSIS — M9901 Segmental and somatic dysfunction of cervical region: Secondary | ICD-10-CM | POA: Diagnosis not present

## 2023-08-19 DIAGNOSIS — M5033 Other cervical disc degeneration, cervicothoracic region: Secondary | ICD-10-CM | POA: Diagnosis not present

## 2023-08-19 DIAGNOSIS — M9901 Segmental and somatic dysfunction of cervical region: Secondary | ICD-10-CM | POA: Diagnosis not present

## 2023-08-26 DIAGNOSIS — M9901 Segmental and somatic dysfunction of cervical region: Secondary | ICD-10-CM | POA: Diagnosis not present

## 2023-08-26 DIAGNOSIS — M5033 Other cervical disc degeneration, cervicothoracic region: Secondary | ICD-10-CM | POA: Diagnosis not present

## 2023-09-02 DIAGNOSIS — M5033 Other cervical disc degeneration, cervicothoracic region: Secondary | ICD-10-CM | POA: Diagnosis not present

## 2023-09-02 DIAGNOSIS — M9901 Segmental and somatic dysfunction of cervical region: Secondary | ICD-10-CM | POA: Diagnosis not present

## 2023-09-16 DIAGNOSIS — M9901 Segmental and somatic dysfunction of cervical region: Secondary | ICD-10-CM | POA: Diagnosis not present

## 2023-09-16 DIAGNOSIS — M5033 Other cervical disc degeneration, cervicothoracic region: Secondary | ICD-10-CM | POA: Diagnosis not present

## 2023-09-23 DIAGNOSIS — M5033 Other cervical disc degeneration, cervicothoracic region: Secondary | ICD-10-CM | POA: Diagnosis not present

## 2023-09-23 DIAGNOSIS — M9901 Segmental and somatic dysfunction of cervical region: Secondary | ICD-10-CM | POA: Diagnosis not present

## 2023-09-30 DIAGNOSIS — M9901 Segmental and somatic dysfunction of cervical region: Secondary | ICD-10-CM | POA: Diagnosis not present

## 2023-09-30 DIAGNOSIS — M5033 Other cervical disc degeneration, cervicothoracic region: Secondary | ICD-10-CM | POA: Diagnosis not present

## 2023-10-07 DIAGNOSIS — M5033 Other cervical disc degeneration, cervicothoracic region: Secondary | ICD-10-CM | POA: Diagnosis not present

## 2023-10-07 DIAGNOSIS — M9901 Segmental and somatic dysfunction of cervical region: Secondary | ICD-10-CM | POA: Diagnosis not present

## 2023-10-12 NOTE — Progress Notes (Addendum)
 [facility] - Clinic Note  10/26/2023   CHIEF COMPLAINT Patient presents for Retina Follow Up  HISTORY OF PRESENT ILLNESS: Jasmine Fox is a 63 y.o. female who presents to the clinic today for:  HPI     Retina Follow Up   Patient presents with  Other (ERM).  In right eye.  Severity is moderate.  Duration of 3 months.  Since onset it is stable.  I, the attending physician,  performed the HPI with the patient and updated documentation appropriately.        Comments   Patient states vision the same OU.      Last edited by Ronelle Coffee, MD on 10/26/2023  9:57 PM.    Pt states when she covers her left eye, the vision in her right eye is blurry  Referring physician: Patria Bookbinder, OD [address] [place] [place],  [place] [address]  HISTORICAL INFORMATION:  Selected notes from the MEDICAL RECORD NUMBER Referred by Dr. Patria Bookbinder for ERM / mac hole OD LEE:  Ocular Hx- PMH-   CURRENT MEDICATIONS: Current Outpatient Medications (Ophthalmic Drugs)  Medication Sig   prednisoLONE  acetate (PRED FORTE ) 1 % ophthalmic suspension Place 1 drop into the right eye 4 (four) times daily. (Patient not taking: Reported on 10/26/2023)   No current facility-administered medications for this visit. (Ophthalmic Drugs)   Current Outpatient Medications (Other)  Medication Sig   Plecanatide  (TRULANCE ) 3 MG TABS Take 1 tablet (3 mg total) by mouth daily.   No current facility-administered medications for this visit. (Other)   REVIEW OF SYSTEMS: ROS   Positive for: Eyes Negative for: Constitutional, Gastrointestinal, Neurological, Skin, Genitourinary, Musculoskeletal, HENT, Endocrine, Cardiovascular, Respiratory, Psychiatric, Allergic/Imm, Heme/Lymph Last edited by Diona Franklin D, COT on 10/26/2023  7:43 AM.     ALLERGIES No Known Allergies  PAST MEDICAL HISTORY Past Medical History:  Diagnosis Date   Constipation    Migraine    none since 2013   Urolithiasis     Past Surgical History:  Procedure Laterality Date   BREAST BIOPSY Left 08/23/2008   DUCTAL PAPILLOMA WITH CALCIFICATIONS/ FIBROCYSTIC CHANGES AND CALCIFICATIONS   CATARACT EXTRACTION Left 07/27/2018   COLONOSCOPY N/A 11/10/2012   Procedure: COLONOSCOPY;  Surgeon: Suzette Espy, MD;  [facility];  Service: Endoscopy;  Laterality: N/A;  11:15   COLONOSCOPY WITH PROPOFOL  N/A 03/09/2023   Procedure: COLONOSCOPY WITH PROPOFOL ;  Surgeon: Suzette Espy, MD;  [facility];  Service: Endoscopy;  Laterality: N/A;  9:30 AM, ASA 2   ESOPHAGOGASTRODUODENOSCOPY  02/18/2002   AOZ:[id number] esophagus/s/p 54 French Maloney dilator   KIDNEY STONE SURGERY     lt bunion surgery     POLYPECTOMY  03/09/2023   Procedure: POLYPECTOMY INTESTINAL;  Surgeon: Suzette Espy, MD;  [facility];  Service: Endoscopy;;   FAMILY HISTORY Family History  Problem Relation Age of Onset   Heart disease Mother        congestive heart failure   Diabetes Father    Hypertension Father    Colonic polyp Father    Cancer Brother        brain cancer   Hypertension Brother    Colon cancer Neg Hx    SOCIAL HISTORY Social History   Tobacco Use   Smoking status: Former   Smokeless tobacco: Never   Tobacco comments:    Only about 2 packs cigarettes in lifetime  Vaping Use   Vaping status:  Never Used  Substance Use Topics   Alcohol use: No    Alcohol/week: 0.0 standard drinks of alcohol   Drug use: No       OPHTHALMIC EXAM:  Base Eye Exam     Visual Acuity (Snellen - Linear)       Right Left   Dist cc 20/50 -2 20/20 -1    Correction: Glasses         Tonometry (Tonopen, 7:44 AM)       Right Left   Pressure 15 15         Pupils       Dark Light Shape React APD   Right 4 3 Round Brisk None   Left 4 3 Round Brisk None         Visual Fields (Counting fingers)       Left Right    Full Full         Extraocular Movement       Right Left    Full,  Ortho Full, Ortho         Neuro/Psych     Oriented x3: Yes   Mood/Affect: Normal         Dilation     Both eyes: 1.0% Mydriacyl , 2.5% Phenylephrine  @ 7:44 AM           Slit Lamp and Fundus Exam     Slit Lamp Exam       Right Left   Lids/Lashes Dermatochalasis - upper lid Dermatochalasis - upper lid   Conjunctiva/Sclera White and quiet White and quiet   Cornea mild arcus trace PEE   Anterior Chamber deep and clear deep and clear   Iris Round and dilated Round and dilated   Lens PC IOL in good position, open PC PC IOL in good position with open PC   Anterior Vitreous syneresis, Posterior vitreous detachment syneresis         Fundus Exam       Right Left   Disc mild Pallor, Sharp rim Pink and Sharp, mild PPA   C/D Ratio 0.4 0.4   Macula Blunted foveal reflex, ERM with central thickening and striae, no heme Flat, Blunted foveal reflex, RPE mottling and clumping, fine drusen, No heme or edema   Vessels attenuated, Tortuous attenuated, mild tortuosity   Periphery Attached, mild reticular degeneration, No heme Attached, No heme           Refraction     Wearing Rx       Sphere Cylinder Axis Add   Right [place] +0.25 100 +2.50   Left -2.50 +0.50 097 +2.50           IMAGING AND PROCEDURES  Imaging and Procedures for 10/26/2023  OCT, Retina - OU - Both Eyes       Right Eye Quality was good. Central Foveal Thickness: 546. Progression has worsened. Findings include no SRF, abnormal foveal contour, epiretinal membrane, intraretinal fluid, macular pucker (ERM with central cystic changes and central retinal thickening -- slightly increased).   Left Eye Quality was good. Central Foveal Thickness: 299. Progression has been stable. Findings include normal foveal contour, no IRF, no SRF, retinal drusen (Focal central druse, trace ERM).   Notes *Images captured and stored on drive  Diagnosis / Impression:  OD: ERM with central cystic changes and central retinal  thickening -- slightly increased OS: Focal central druse, trace ERM  Clinical management:  See below  Abbreviations: NFP - Normal foveal profile. CME - cystoid  macular edema. PED - pigment epithelial detachment. IRF - intraretinal fluid. SRF - subretinal fluid. EZ - ellipsoid zone. ERM - epiretinal membrane. ORA - outer retinal atrophy. ORT - outer retinal tubulation. SRHM - subretinal hyper-reflective material. IRHM - intraretinal hyper-reflective material           ASSESSMENT/PLAN:   ICD-10-CM   1. Epiretinal membrane (ERM) of right eye  H35.371 OCT, Retina - OU - Both Eyes    2. Pseudophakia, both eyes  Z96.1      Epiretinal membrane, right eye  - ERM with central cystic changes and central retinal thickening -- slightly increased  - BCVA decreased to 20/50 from 20/40 - +metamorphopsia - will likely benefit from PPV w/ ERM peel + gas OD - RBA of procedure discussed, questions answered - Pt wishes to proceed with surgery on May 29th - f/u May 22 -- DFE/OCT, preop visit and paperwork  2. Pseudophakia OU  - s/p CE/IOL OU (Dr. Lasandra Points, 2020)  - IOLs in good position  - s/p Yag Cap OD (01.06.25) -- good PC opening - monitor   Ophthalmic Meds Ordered this visit:  No orders of the defined types were placed in this encounter.    Return in about 17 days (around 11/12/2023) for f/u ERM OD, DFE, OCT -- pre-op visit.  There are no Patient Instructions on file for this visit.  Explained the diagnoses, plan, and follow up with the patient and they expressed understanding.  Patient expressed understanding of the importance of proper follow up care.   This document serves as a record of services personally performed by Jeanice Millard, MD, PhD. It was created on their behalf by Morley Arabia. Bevin Bucks, OA an ophthalmic technician. The creation of this record is the provider's dictation and/or activities during the visit.    Electronically signed by: Morley Arabia. Bevin Bucks, OA 10/26/23 10:10  PM   Jeanice Millard, M.D., Ph.D. Diseases & Surgery of the Retina and Vitreous [facility] 10/26/2023  I have reviewed the above documentation for accuracy and completeness, and I agree with the above. Jeanice Millard, M.D., Ph.D. 10/26/23 10:10 PM   Abbreviations: M myopia (nearsighted); A astigmatism; H hyperopia (farsighted); P presbyopia; Mrx spectacle prescription;  CTL contact lenses; OD right eye; OS left eye; OU both eyes  XT exotropia; ET esotropia; PEK punctate epithelial keratitis; PEE punctate epithelial erosions; DES dry eye syndrome; MGD meibomian gland dysfunction; ATs artificial tears; PFAT's preservative free artificial tears; NSC nuclear sclerotic cataract; PSC posterior subcapsular cataract; ERM epi-retinal membrane; PVD posterior vitreous detachment; RD retinal detachment; DM diabetes mellitus; DR diabetic retinopathy; NPDR non-proliferative diabetic retinopathy; PDR proliferative diabetic retinopathy; CSME clinically significant macular edema; DME diabetic macular edema; dbh dot blot hemorrhages; CWS cotton wool spot; POAG primary open angle glaucoma; C/D cup-to-disc ratio; HVF humphrey visual field; GVF goldmann visual field; OCT optical coherence tomography; IOP intraocular pressure; BRVO Branch retinal vein occlusion; CRVO central retinal vein occlusion; CRAO central retinal artery occlusion; BRAO branch retinal artery occlusion; RT retinal tear; SB scleral buckle; PPV pars plana vitrectomy; VH Vitreous hemorrhage; PRP panretinal laser photocoagulation; IVK intravitreal kenalog ; VMT vitreomacular traction; MH Macular hole;  NVD neovascularization of the disc; NVE neovascularization elsewhere; AREDS age related eye disease study; ARMD age related macular degeneration; POAG primary open angle glaucoma; EBMD epithelial/anterior basement membrane dystrophy; ACIOL anterior chamber intraocular lens; IOL intraocular lens; PCIOL posterior chamber intraocular lens;  Phaco/IOL phacoemulsification with intraocular lens placement; PRK photorefractive keratectomy; LASIK  laser assisted in situ keratomileusis; HTN hypertension; DM diabetes mellitus; COPD chronic obstructive pulmonary disease

## 2023-10-14 DIAGNOSIS — M9901 Segmental and somatic dysfunction of cervical region: Secondary | ICD-10-CM | POA: Diagnosis not present

## 2023-10-14 DIAGNOSIS — M5033 Other cervical disc degeneration, cervicothoracic region: Secondary | ICD-10-CM | POA: Diagnosis not present

## 2023-10-21 DIAGNOSIS — M9901 Segmental and somatic dysfunction of cervical region: Secondary | ICD-10-CM | POA: Diagnosis not present

## 2023-10-21 DIAGNOSIS — M5033 Other cervical disc degeneration, cervicothoracic region: Secondary | ICD-10-CM | POA: Diagnosis not present

## 2023-10-26 ENCOUNTER — Encounter (INDEPENDENT_AMBULATORY_CARE_PROVIDER_SITE_OTHER): Payer: Self-pay | Admitting: Ophthalmology

## 2023-10-26 ENCOUNTER — Ambulatory Visit (INDEPENDENT_AMBULATORY_CARE_PROVIDER_SITE_OTHER): Payer: BC Managed Care – PPO | Admitting: Ophthalmology

## 2023-10-26 DIAGNOSIS — H35371 Puckering of macula, right eye: Secondary | ICD-10-CM

## 2023-10-26 DIAGNOSIS — H26491 Other secondary cataract, right eye: Secondary | ICD-10-CM

## 2023-10-26 DIAGNOSIS — Z961 Presence of intraocular lens: Secondary | ICD-10-CM | POA: Diagnosis not present

## 2023-10-28 DIAGNOSIS — M9901 Segmental and somatic dysfunction of cervical region: Secondary | ICD-10-CM | POA: Diagnosis not present

## 2023-10-28 DIAGNOSIS — M5033 Other cervical disc degeneration, cervicothoracic region: Secondary | ICD-10-CM | POA: Diagnosis not present

## 2023-11-02 NOTE — Progress Notes (Shared)
 [facility] - Clinic Note  11/11/2023   CHIEF COMPLAINT Patient presents for Retina Follow Up  HISTORY OF PRESENT ILLNESS: Jasmine Fox is a 63 y.o. female who presents to the clinic today for:  HPI     Retina Follow Up   Patient presents with  Other (ERM).  In right eye.  Severity is moderate.  Duration of 3 months.  Since onset it is stable.  I, the attending physician,  performed the HPI with the patient and updated documentation appropriately.        Comments   Patient states vision the same OU. Pt is using Systane ultra tid OU. Pt states there is a floater in her left eye but it has been there. Pt denies any pain.      Last edited by Ronelle Coffee, MD on 11/11/2023  8:17 AM.    Pt wants to proceed with surgery for her ERM next Thursday, she has not noticed any change in vision  Referring physician: Patria Bookbinder, OD [address] [place] [place],  [place] [address]  HISTORICAL INFORMATION:  Selected notes from the MEDICAL RECORD NUMBER Referred by Dr. Patria Bookbinder for ERM / mac hole OD LEE:  Ocular Hx- PMH-   CURRENT MEDICATIONS: Current Outpatient Medications (Ophthalmic Drugs)  Medication Sig   Propylene Glycol (SYSTANE COMPLETE) 0.6 % SOLN Place 1 spray into both eyes as needed (dry eyes).   No current facility-administered medications for this visit. (Ophthalmic Drugs)   Current Outpatient Medications (Other)  Medication Sig   oxymetazoline (AFRIN) 0.05 % nasal spray Place 1 spray into both nostrils 2 (two) times daily as needed for congestion.   No current facility-administered medications for this visit. (Other)   REVIEW OF SYSTEMS: ROS   Positive for: Eyes Negative for: Constitutional, Gastrointestinal, Neurological, Skin, Genitourinary, Musculoskeletal, HENT, Endocrine, Cardiovascular, Respiratory, Psychiatric, Allergic/Imm, Heme/Lymph Last edited by Carrington Clack, COT on 11/11/2023  7:58 AM.     ALLERGIES No Known  Allergies  PAST MEDICAL HISTORY Past Medical History:  Diagnosis Date   Constipation    Migraine    none since 2013   Urolithiasis    Past Surgical History:  Procedure Laterality Date   BREAST BIOPSY Left 08/23/2008   DUCTAL PAPILLOMA WITH CALCIFICATIONS/ FIBROCYSTIC CHANGES AND CALCIFICATIONS   CATARACT EXTRACTION Left 07/27/2018   COLONOSCOPY N/A 11/10/2012   Procedure: COLONOSCOPY;  Surgeon: Suzette Espy, MD;  [facility];  Service: Endoscopy;  Laterality: N/A;  11:15   COLONOSCOPY WITH PROPOFOL  N/A 03/09/2023   Procedure: COLONOSCOPY WITH PROPOFOL ;  Surgeon: Suzette Espy, MD;  [facility];  Service: Endoscopy;  Laterality: N/A;  9:30 AM, ASA 2   ESOPHAGOGASTRODUODENOSCOPY  02/18/2002   BJY:[id number] esophagus/s/p 54 French Maloney dilator   KIDNEY STONE SURGERY     lt bunion surgery     POLYPECTOMY  03/09/2023   Procedure: POLYPECTOMY INTESTINAL;  Surgeon: Suzette Espy, MD;  [facility];  Service: Endoscopy;;   FAMILY HISTORY Family History  Problem Relation Age of Onset   Heart disease Mother        congestive heart failure   Diabetes Father    Hypertension Father    Colonic polyp Father    Cancer Brother        brain cancer   Hypertension Brother    Colon cancer Neg Hx    SOCIAL HISTORY Social History   Tobacco Use   Smoking status:  Former   Smokeless tobacco: Never   Tobacco comments:    Only about 2 packs cigarettes in lifetime  Vaping Use   Vaping status: Never Used  Substance Use Topics   Alcohol use: No    Alcohol/week: 0.0 standard drinks of alcohol   Drug use: No       OPHTHALMIC EXAM:  Base Eye Exam     Visual Acuity (Snellen - Linear)       Right Left   Dist cc 20/50 -1 20/20   Dist ph cc NI     Correction: Glasses         Tonometry (Tonopen, 8:02 AM)       Right Left   Pressure 9 13         Pupils       Pupils Dark Light Shape React APD   Right PERRL 3 2 Round Brisk None   Left  PERRL 3 2 Round Brisk None         Visual Fields       Left Right    Full Full         Extraocular Movement       Right Left    Full, Ortho Full, Ortho         Neuro/Psych     Oriented x3: Yes   Mood/Affect: Normal         Dilation     Both eyes: 1.0% Mydriacyl, 2.5% Phenylephrine @ 8:02 AM           Slit Lamp and Fundus Exam     Slit Lamp Exam       Right Left   Lids/Lashes Dermatochalasis - upper lid Dermatochalasis - upper lid   Conjunctiva/Sclera White and quiet White and quiet   Cornea mild arcus, trace tear film debris trace PEE   Anterior Chamber deep and clear deep and clear   Iris Round and dilated to 7.66mm Round and dilated   Lens PC IOL in good position, open PC PC IOL in good position with open PC   Anterior Vitreous syneresis, Posterior vitreous detachment syneresis         Fundus Exam       Right Left   Disc mild Pallor, Sharp rim Pink and Sharp, mild PPA   C/D Ratio 0.5 0.4   Macula Blunted foveal reflex, ERM with central thickening and striae, no heme Flat, Blunted foveal reflex, RPE mottling and clumping, fine drusen, No heme or edema   Vessels mild tortuosity attenuated, mild tortuosity   Periphery Attached, mild reticular degeneration, No heme Attached, No heme           Refraction     Wearing Rx       Sphere Cylinder Axis Add   Right [place] +0.25 100 +2.50   Left -2.50 +0.50 097 +2.50           IMAGING AND PROCEDURES  Imaging and Procedures for 11/11/2023  OCT, Retina - OU - Both Eyes        Right Eye Quality was good. Central Foveal Thickness: 542. Progression has been stable. Findings include no SRF, abnormal foveal contour, epiretinal membrane, intraretinal fluid, macular pucker (ERM with central cystic changes and central retinal thickening).   Left Eye Quality was good. Central Foveal Thickness: 295. Progression has been stable. Findings include normal foveal contour, no IRF, no SRF, retinal drusen  (Focal central druse, trace ERM).   Notes  *Images captured and stored on drive  Diagnosis / Impression:  OD: ERM with central cystic changes and central retinal thickening OS: NFP; no IRF/SRF; Focal central druse, trace ERM  Clinical management:  See below  Abbreviations: NFP - Normal foveal profile. CME - cystoid macular edema. PED - pigment epithelial detachment. IRF - intraretinal fluid. SRF - subretinal fluid. EZ - ellipsoid zone. ERM - epiretinal membrane. ORA - outer retinal atrophy. ORT - outer retinal tubulation. SRHM - subretinal hyper-reflective material. IRHM - intraretinal hyper-reflective material           ASSESSMENT/PLAN:   ICD-10-CM   1. Epiretinal membrane (ERM) of right eye  H35.371 OCT, Retina - OU - Both Eyes    2. Pseudophakia, both eyes  Z96.1      Epiretinal membrane, right eye  - ERM with central cystic changes and central retinal thickening - BCVA stable at 20/50 - +metamorphopsia - recommend PPV w/ membrane peel and gas injection OD - RBA of procedure discussed, questions answered - informed consent obtained and signed - surgery scheduled for Thursday, Nov 19, 2023, [facility] OR 8, 11:30am - f/u Friday, May 30th, POV - DFE OD only, no OCT  2. Pseudophakia OU  - s/p CE/IOL OU (Dr. Lasandra Points, 2020)  - IOLs in good position  - s/p Yag Cap OD (01.06.25) -- good PC opening - monitor   Ophthalmic Meds Ordered this visit:  No orders of the defined types were placed in this encounter.    Return in about 9 days (around 11/20/2023) for POV - ERM OD.  There are no Patient Instructions on file for this visit.  Explained the diagnoses, plan, and follow up with the patient and they expressed understanding.  Patient expressed understanding of the importance of proper follow up care.   This document serves as a record of services personally performed by Jeanice Millard, MD, PhD. It was created on their behalf by Angelia Kelp, an ophthalmic technician. The  creation of this record is the provider's dictation and/or activities during the visit.    Electronically signed by: Angelia Kelp, OA, 11/15/23  1:34 AM  This document serves as a record of services personally performed by Jeanice Millard, MD, PhD. It was created on their behalf by Morley Arabia. Bevin Bucks, OA an ophthalmic technician. The creation of this record is the provider's dictation and/or activities during the visit.    Electronically signed by: Morley Arabia. Bevin Bucks, OA 11/15/23 1:34 AM  Jeanice Millard, M.D., Ph.D. Diseases & Surgery of the Retina and Vitreous [facility] 11/11/2023  I have reviewed the above documentation for accuracy and completeness, and I agree with the above. Jeanice Millard, M.D., Ph.D. 11/15/23 1:34 AM   Abbreviations: M myopia (nearsighted); A astigmatism; H hyperopia (farsighted); P presbyopia; Mrx spectacle prescription;  CTL contact lenses; OD right eye; OS left eye; OU both eyes  XT exotropia; ET esotropia; PEK punctate epithelial keratitis; PEE punctate epithelial erosions; DES dry eye syndrome; MGD meibomian gland dysfunction; ATs artificial tears; PFAT's preservative free artificial tears; NSC nuclear sclerotic cataract; PSC posterior subcapsular cataract; ERM epi-retinal membrane; PVD posterior vitreous detachment; RD retinal detachment; DM diabetes mellitus; DR diabetic retinopathy; NPDR non-proliferative diabetic retinopathy; PDR proliferative diabetic retinopathy; CSME clinically significant macular edema; DME diabetic macular edema; dbh dot blot hemorrhages; CWS cotton wool spot; POAG primary open angle glaucoma; C/D cup-to-disc ratio; HVF humphrey visual field; GVF goldmann visual field; OCT optical coherence tomography; IOP intraocular pressure; BRVO Branch retinal vein occlusion; CRVO central retinal  vein occlusion; CRAO central retinal artery occlusion; BRAO branch retinal artery occlusion; RT retinal tear; SB scleral buckle; PPV pars  plana vitrectomy; VH Vitreous hemorrhage; PRP panretinal laser photocoagulation; IVK intravitreal kenalog; VMT vitreomacular traction; MH Macular hole;  NVD neovascularization of the disc; NVE neovascularization elsewhere; AREDS age related eye disease study; ARMD age related macular degeneration; POAG primary open angle glaucoma; EBMD epithelial/anterior basement membrane dystrophy; ACIOL anterior chamber intraocular lens; IOL intraocular lens; PCIOL posterior chamber intraocular lens; Phaco/IOL phacoemulsification with intraocular lens placement; PRK photorefractive keratectomy; LASIK laser assisted in situ keratomileusis; HTN hypertension; DM diabetes mellitus; COPD chronic obstructive pulmonary disease

## 2023-11-04 DIAGNOSIS — M5033 Other cervical disc degeneration, cervicothoracic region: Secondary | ICD-10-CM | POA: Diagnosis not present

## 2023-11-04 DIAGNOSIS — M9901 Segmental and somatic dysfunction of cervical region: Secondary | ICD-10-CM | POA: Diagnosis not present

## 2023-11-09 ENCOUNTER — Encounter (INDEPENDENT_AMBULATORY_CARE_PROVIDER_SITE_OTHER): Admitting: Ophthalmology

## 2023-11-11 ENCOUNTER — Ambulatory Visit (INDEPENDENT_AMBULATORY_CARE_PROVIDER_SITE_OTHER): Admitting: Ophthalmology

## 2023-11-11 ENCOUNTER — Encounter (INDEPENDENT_AMBULATORY_CARE_PROVIDER_SITE_OTHER): Payer: Self-pay | Admitting: Ophthalmology

## 2023-11-11 DIAGNOSIS — H35371 Puckering of macula, right eye: Secondary | ICD-10-CM | POA: Diagnosis not present

## 2023-11-11 DIAGNOSIS — H26491 Other secondary cataract, right eye: Secondary | ICD-10-CM

## 2023-11-11 DIAGNOSIS — M9901 Segmental and somatic dysfunction of cervical region: Secondary | ICD-10-CM | POA: Diagnosis not present

## 2023-11-11 DIAGNOSIS — M5033 Other cervical disc degeneration, cervicothoracic region: Secondary | ICD-10-CM | POA: Diagnosis not present

## 2023-11-11 DIAGNOSIS — Z961 Presence of intraocular lens: Secondary | ICD-10-CM | POA: Diagnosis not present

## 2023-11-12 ENCOUNTER — Encounter (INDEPENDENT_AMBULATORY_CARE_PROVIDER_SITE_OTHER): Admitting: Ophthalmology

## 2023-11-18 ENCOUNTER — Encounter (HOSPITAL_COMMUNITY): Payer: Self-pay | Admitting: Ophthalmology

## 2023-11-18 ENCOUNTER — Other Ambulatory Visit: Payer: Self-pay

## 2023-11-18 NOTE — Progress Notes (Signed)
 PCP - Kathyleen Parkins, MD  Cardiologist -   PPM/ICD - denies Device Orders - n/a Rep Notified - n/a  Chest x-ray - denies EKG - denies Stress Test - denies ECHO - denies Cardiac Cath - denies  DM -denies  Blood Thinner Instructions: denies Aspirin Instructions: n/a  ERAS Protcol - clear liquids until 8:30am  COVID TEST- n/a  Anesthesia review: no  Patient verbally denies any shortness of breath, fever, cough and chest pain during phone call   -------------  SDW INSTRUCTIONS given:  Your procedure is scheduled on Nov 19, 2023.  Report to [facility] Main Entrance "A" at 9:00 A.M., and check in at the Admitting office.  Call this number if you have problems the morning of surgery:  [phone]   Remember:  Do not eat after midnight the night before your surgery  You may drink clear liquids until 8:30 the morning of your surgery.   Clear liquids allowed are: Water , Non-Citrus Juices (without pulp), Carbonated Beverages, Clear Tea, Black Coffee Only, and Gatorade    Take these medicines the morning of surgery with A SIP OF WATER   (SYSTANE COMPLETE)  (AFRIN)   As of today, STOP taking any Aspirin (unless otherwise instructed by your surgeon) Aleve, Naproxen, Ibuprofen, Motrin, Advil, Goody's, BC's, all herbal medications, fish oil, and all vitamins.                      Do not wear jewelry, make up, or nail polish            Do not wear lotions, powders, perfumes/colognes, or deodorant.            Do not shave 48 hours prior to surgery.  Men may shave face and neck.            Do not bring valuables to the hospital.            [facility] is not responsible for any belongings or valuables.  Do NOT Smoke (Tobacco/Vaping) 24 hours prior to your procedure If you use a CPAP at night, you may bring all equipment for your overnight stay.   Contacts, glasses, dentures or bridgework may not be worn into surgery.      For patients admitted to the hospital, discharge time  will be determined by your treatment team.   Patients discharged the day of surgery will not be allowed to drive home, and someone needs to stay with them for 24 hours.    Special instructions:   [facility]- Preparing For Surgery  Before surgery, you can play an important role. Because skin is not sterile, your skin needs to be as free of germs as possible. You can reduce the number of germs on your skin by washing with CHG (chlorahexidine gluconate) Soap before surgery.  CHG is an antiseptic cleaner which kills germs and bonds with the skin to continue killing germs even after washing.    Oral Hygiene is also important to reduce your risk of infection.  Remember - BRUSH YOUR TEETH THE MORNING OF SURGERY WITH YOUR REGULAR TOOTHPASTE  Please do not use if you have an allergy to CHG or antibacterial soaps. If your skin becomes reddened/irritated stop using the CHG.  Do not shave (including legs and underarms) for at least 48 hours prior to first CHG shower. It is OK to shave your face.  Please follow these instructions carefully.   Shower the NIGHT BEFORE SURGERY and the MORNING OF SURGERY  with DIAL Soap.   Pat yourself dry with a CLEAN TOWEL.  Wear CLEAN PAJAMAS to bed the night before surgery  Place CLEAN SHEETS on your bed the night of your first shower and DO NOT SLEEP WITH PETS.   Day of Surgery: Please shower morning of surgery  Wear Clean/Comfortable clothing the morning of surgery Do not apply any deodorants/lotions.   Remember to brush your teeth WITH YOUR REGULAR TOOTHPASTE.   Questions were answered. Patient verbalized understanding of instructions.

## 2023-11-18 NOTE — Progress Notes (Signed)
 [facility] - Clinic Note  11/20/2023   CHIEF COMPLAINT Patient presents for Post-op Follow-up  HISTORY OF PRESENT ILLNESS: Jasmine Fox is a 63 y.o. female who presents to the clinic today for:  HPI     Post-op Follow-up   In right eye.  Discomfort includes pain.  Negative for itching, foreign body sensation, tearing, discharge and floaters.  I, the attending physician,  performed the HPI with the patient and updated documentation appropriately.        Comments   1 day S/p PPV  mp [id number] Patient states had some pain last night. Patient took tylenol. Patient nauseated for a while not much appetite      Last edited by Ronelle Coffee, MD on 11/20/2023  4:08 PM.    Pt states last night was okay, her back hurt more than her eye, she had a little eye pain, but took Tylenol and it went away, she is doing the face down positioning  Referring physician: Patria Bookbinder, OD [address] [place] [place],  [place] [address]  HISTORICAL INFORMATION:  Selected notes from the MEDICAL RECORD NUMBER Referred by Dr. Patria Bookbinder for ERM / mac hole OD LEE:  Ocular Hx- PMH-   CURRENT MEDICATIONS: Current Outpatient Medications (Ophthalmic Drugs)  Medication Sig   Propylene Glycol (SYSTANE COMPLETE) 0.6 % SOLN Place 1 spray into both eyes as needed (dry eyes).   No current facility-administered medications for this visit. (Ophthalmic Drugs)   Current Outpatient Medications (Other)  Medication Sig   oxymetazoline (AFRIN) 0.05 % nasal spray Place 1 spray into both nostrils 2 (two) times daily as needed for congestion.   No current facility-administered medications for this visit. (Other)   REVIEW OF SYSTEMS: ROS   Positive for: Eyes Negative for: Constitutional, Gastrointestinal, Neurological, Skin, Genitourinary, Musculoskeletal, HENT, Endocrine, Cardiovascular, Respiratory, Psychiatric, Allergic/Imm, Heme/Lymph Last edited by Leonia Raman, COT on 11/20/2023  7:59 AM.       ALLERGIES No Known Allergies  PAST MEDICAL HISTORY Past Medical History:  Diagnosis Date   Constipation    Migraine    none since 2013   Urolithiasis    Past Surgical History:  Procedure Laterality Date   BREAST BIOPSY Left 08/23/2008   DUCTAL PAPILLOMA WITH CALCIFICATIONS/ FIBROCYSTIC CHANGES AND CALCIFICATIONS   CATARACT EXTRACTION Left 07/27/2018   COLONOSCOPY N/A 11/10/2012   Procedure: COLONOSCOPY;  Surgeon: Suzette Espy, MD;  [facility];  Service: Endoscopy;  Laterality: N/A;  11:15   COLONOSCOPY WITH PROPOFOL  N/A 03/09/2023   Procedure: COLONOSCOPY WITH PROPOFOL ;  Surgeon: Suzette Espy, MD;  [facility];  Service: Endoscopy;  Laterality: N/A;  9:30 AM, ASA 2   ESOPHAGOGASTRODUODENOSCOPY  02/18/2002   [id number] esophagus/s/p 54 French Maloney dilator   KIDNEY STONE SURGERY     lt bunion surgery     PARS PLANA VITRECTOMY W/REMOVE OF INTERNAL LIMITING MEMBRANE W/TAMPONADE Right 11/19/2023   Procedure: PARS PLANA VITRECTOMY WITH 25 GAUGE WITH REMOVAL OF INTERNAL LIMITING MEMBRANE OF RETINA WITH INTRAOCULAR TAMPONADE;  Surgeon: Ronelle Coffee, MD;  Location: [facility] OR;  Service: Ophthalmology;  Laterality: Right;  C3F8 gas insertion   POLYPECTOMY  03/09/2023   Procedure: POLYPECTOMY INTESTINAL;  Surgeon: Suzette Espy, MD;  [facility];  Service: Endoscopy;;   FAMILY HISTORY Family History  Problem Relation Age of Onset   Heart disease Mother        congestive heart failure   Diabetes Father  Hypertension Father    Colonic polyp Father    Cancer Brother        brain cancer   Hypertension Brother    Colon cancer Neg Hx    SOCIAL HISTORY Social History   Tobacco Use   Smoking status: Former   Smokeless tobacco: Never   Tobacco comments:    Only about 2 packs cigarettes in lifetime  Vaping Use   Vaping status: Never Used  Substance Use Topics   Alcohol use: No    Alcohol/week: 0.0 standard drinks of alcohol   Drug  use: No       OPHTHALMIC EXAM:  Base Eye Exam     Visual Acuity (Snellen - Linear)       Right Left   Dist [place] 20/CF          Tonometry (Tonopen, 8:10 AM)       Right Left   Pressure 9          Visual Fields (Counting fingers)       Left Right     Full         Extraocular Movement       Right Left    Full, Ortho Full, Ortho         Neuro/Psych     Oriented x3: Yes   Mood/Affect: Normal         Dilation     Right eye: 2.5% Phenylephrine @ 8:10 AM           Slit Lamp and Fundus Exam     Slit Lamp Exam       Right Left   Lids/Lashes Dermatochalasis - upper lid Dermatochalasis - upper lid   Conjunctiva/Sclera Minimal subconjunctival hemorrhage, sutures intact, chemosis nasally White and quiet   Cornea arcus, trace descemet's folds, endo heme trace PEE   Anterior Chamber deep, 3-4+cell deep and clear   Iris Round and moderately dilated Round and dilated   Lens PC IOL in good position, open PC PC IOL in good position with open PC   Anterior Vitreous post vitrectomy, good gas fill syneresis         Fundus Exam       Right Left   Disc Perfused, +heme    C/D Ratio 0.5 0.4   Macula flat under gas, ERM gone    Vessels attenuated, mild tortuosity    Periphery Attached, mild reticular degeneration, No heme, good early 360 laser changes peripherally            IMAGING AND PROCEDURES  Imaging and Procedures for 11/20/2023         ASSESSMENT/PLAN:   ICD-10-CM   1. Epiretinal membrane (ERM) of right eye  H35.371     2. Pseudophakia, both eyes  Z96.1      Epiretinal membrane, right eye  - ERM with central cystic changes and central retinal thickening - pre-op BCVA 20/50 - +metamorphopsia - POD1 s/p PPV/TissueBlue/MP/14% C3F8 OD, 05.29.25             - IOP good at 09             - ERM gone and good gas bubble in place             - IOP 9             - start PF q2h OD zymaxid QID OD  Atropine BID OD PSO ung QID OD               -  cont face down positioning x3 days then can decrease positioning to 50% of time; avoid laying flat on back              - eye shield when sleeping              - post op drop and positioning instructions reviewed              - tylenol/ibuprofen for pain  - next Thursday, June 5th, DFE OD only, no OCT  2. Pseudophakia OU  - s/p CE/IOL OU (Dr. Lasandra Points, 2020)  - IOLs in good position  - s/p Yag Cap OD (01.06.25) -- good PC opening - monitor   Ophthalmic Meds Ordered this visit:  No orders of the defined types were placed in this encounter.    Return in about 6 days (around 11/26/2023) for f/u ERM OD, DFE, OCT, POV.  There are no Patient Instructions on file for this visit.  Explained the diagnoses, plan, and follow up with the patient and they expressed understanding.  Patient expressed understanding of the importance of proper follow up care.   This document serves as a record of services personally performed by Jeanice Millard, MD, PhD. It was created on their behalf by Morley Arabia. Bevin Bucks, OA an ophthalmic technician. The creation of this record is the provider's dictation and/or activities during the visit.    Electronically signed by: Morley Arabia. Bevin Bucks, OA 11/20/23 4:09 PM  Jeanice Millard, M.D., Ph.D. Diseases & Surgery of the Retina and Vitreous [facility] 11/20/2023  I have reviewed the above documentation for accuracy and completeness, and I agree with the above. Jeanice Millard, M.D., Ph.D. 11/20/23 4:12 PM   Abbreviations: M myopia (nearsighted); A astigmatism; H hyperopia (farsighted); P presbyopia; Mrx spectacle prescription;  CTL contact lenses; OD right eye; OS left eye; OU both eyes  XT exotropia; ET esotropia; PEK punctate epithelial keratitis; PEE punctate epithelial erosions; DES dry eye syndrome; MGD meibomian gland dysfunction; ATs artificial tears; PFAT's preservative free artificial tears; NSC nuclear sclerotic cataract; PSC posterior subcapsular  cataract; ERM epi-retinal membrane; PVD posterior vitreous detachment; RD retinal detachment; DM diabetes mellitus; DR diabetic retinopathy; NPDR non-proliferative diabetic retinopathy; PDR proliferative diabetic retinopathy; CSME clinically significant macular edema; DME diabetic macular edema; dbh dot blot hemorrhages; CWS cotton wool spot; POAG primary open angle glaucoma; C/D cup-to-disc ratio; HVF humphrey visual field; GVF goldmann visual field; OCT optical coherence tomography; IOP intraocular pressure; BRVO Branch retinal vein occlusion; CRVO central retinal vein occlusion; CRAO central retinal artery occlusion; BRAO branch retinal artery occlusion; RT retinal tear; SB scleral buckle; PPV pars plana vitrectomy; VH Vitreous hemorrhage; PRP panretinal laser photocoagulation; IVK intravitreal kenalog; VMT vitreomacular traction; MH Macular hole;  NVD neovascularization of the disc; NVE neovascularization elsewhere; AREDS age related eye disease study; ARMD age related macular degeneration; POAG primary open angle glaucoma; EBMD epithelial/anterior basement membrane dystrophy; ACIOL anterior chamber intraocular lens; IOL intraocular lens; PCIOL posterior chamber intraocular lens; Phaco/IOL phacoemulsification with intraocular lens placement; PRK photorefractive keratectomy; LASIK laser assisted in situ keratomileusis; HTN hypertension; DM diabetes mellitus; COPD chronic obstructive pulmonary disease

## 2023-11-18 NOTE — H&P (Signed)
 Jasmine Fox is an 63 y.o. female.    Chief Complaint: epiretinal membrane, RIGHT EYE  HPI: Pt with history of decreased vision and distortion OD. On dilated exam and imaging, was noted to have an epiretinal membrane w/ central cystic changes and central retinal thickening affecting her activities of daily living. After a discussion of the findings, prognosis, treatment options and risks/benefits/alternatives to surgery, the patient elected to proceed with surgery to remove the epiretinal membrane in the right eye via 25g PPV w/ membrane peel and gas injection OD under general anesthesia.  Past Medical History:  Diagnosis Date   Constipation    Migraine    none since 2013   Urolithiasis     Past Surgical History:  Procedure Laterality Date   BREAST BIOPSY Left 08/23/2008   DUCTAL PAPILLOMA WITH CALCIFICATIONS/ FIBROCYSTIC CHANGES AND CALCIFICATIONS   CATARACT EXTRACTION Left 07/27/2018   COLONOSCOPY N/A 11/10/2012   Procedure: COLONOSCOPY;  Surgeon: Suzette Espy, MD;  [facility];  Service: Endoscopy;  Laterality: N/A;  11:15   COLONOSCOPY WITH PROPOFOL  N/A 03/09/2023   Procedure: COLONOSCOPY WITH PROPOFOL ;  Surgeon: Suzette Espy, MD;  [facility];  Service: Endoscopy;  Laterality: N/A;  9:30 AM, ASA 2   ESOPHAGOGASTRODUODENOSCOPY  02/18/2002   [id number] esophagus/s/p 54 French Maloney dilator   KIDNEY STONE SURGERY     lt bunion surgery     POLYPECTOMY  03/09/2023   Procedure: POLYPECTOMY INTESTINAL;  Surgeon: Suzette Espy, MD;  [facility];  Service: Endoscopy;;   Family History  Problem Relation Age of Onset   Heart disease Mother        congestive heart failure   Diabetes Father    Hypertension Father    Colonic polyp Father    Cancer Brother        brain cancer   Hypertension Brother    Colon cancer Neg Hx    Social History:  reports that she has quit smoking. She has never used smokeless tobacco. She reports that she does  not drink alcohol and does not use drugs.  Allergies: No Known Allergies  No medications prior to admission.   Review of systems otherwise negative  There were no vitals taken for this visit.  Physical exam: Mental status: oriented x3. Eyes: See eye exam associated with this date of surgery Ears, Nose, Throat: within normal limits Neck: Within Normal limits General: within normal limits Chest: Within normal limits Breast: deferred Heart: Within normal limits Abdomen: Within normal limits GU: deferred Extremities: within normal limits Skin: within normal limits  Assessment/Plan 1. Epiretinal membrane, RIGHT EYE  Plan: To [facility] for 25g PPV w/ membrane peel and gas, RIGHT EYE, under general anesthesia. - case scheduled for Thursday, 05.29.25, 1130 am -- [facility] OR 08  Jeanice Millard, M.D., Ph.D. Vitreoretinal Surgeon [facility]

## 2023-11-19 ENCOUNTER — Encounter (HOSPITAL_COMMUNITY)
Admission: RE | Disposition: A | Discharge status: Home / Self Care | Payer: Self-pay | Source: Home / Self Care | Attending: Ophthalmology

## 2023-11-19 ENCOUNTER — Ambulatory Visit (HOSPITAL_COMMUNITY): Payer: Self-pay | Admitting: Anesthesiology

## 2023-11-19 ENCOUNTER — Ambulatory Visit (HOSPITAL_COMMUNITY)
Admission: RE | Admit: 2023-11-19 | Discharge: 2023-11-19 | Disposition: A | Discharge status: Home / Self Care | Attending: Ophthalmology | Admitting: Ophthalmology

## 2023-11-19 ENCOUNTER — Encounter (HOSPITAL_COMMUNITY): Payer: Self-pay | Admitting: Ophthalmology

## 2023-11-19 DIAGNOSIS — H35371 Puckering of macula, right eye: Secondary | ICD-10-CM | POA: Insufficient documentation

## 2023-11-19 DIAGNOSIS — Z87891 Personal history of nicotine dependence: Secondary | ICD-10-CM | POA: Insufficient documentation

## 2023-11-19 HISTORY — PX: PARS PLANA VITRECTOMY W/REMOVE OF INTERNAL LIMITING MEMBRANE W/TAMPONADE: SHX7341

## 2023-11-19 LAB — CBC
HCT: 38.7 % (ref 36.0–46.0)
Hemoglobin: 12 g/dL (ref 12.0–15.0)
MCH: 27.6 pg (ref 26.0–34.0)
MCHC: 31 g/dL (ref 30.0–36.0)
MCV: 89 fL (ref 80.0–100.0)
Platelets: 226 10*3/uL (ref 150–400)
RBC: 4.35 MIL/uL (ref 3.87–5.11)
RDW: 13.2 % (ref 11.5–15.5)
WBC: 5.8 10*3/uL (ref 4.0–10.5)
nRBC: 0 % (ref 0.0–0.2)

## 2023-11-19 SURGERY — PARS PLANA VITRECTOMY WITH 25 GAUGE WITH REMOVAL OF INTERNAL LIMITING MEMBRANE OF RETINA WITH INTRAOCULAR TAMPONADE
Anesthesia: General | Laterality: Right

## 2023-11-19 MED ORDER — EPINEPHRINE PF 1 MG/ML IJ SOLN
INTRAMUSCULAR | Status: DC | PRN
  Administered 2023-11-19: 500 mL

## 2023-11-19 MED ORDER — FENTANYL CITRATE (PF) 250 MCG/5ML IJ SOLN
INTRAMUSCULAR | Status: AC
  Filled 2023-11-19: qty 5

## 2023-11-19 MED ORDER — ACETAZOLAMIDE SODIUM 500 MG IJ SOLR
INTRAMUSCULAR | Status: AC
  Filled 2023-11-19: qty 500

## 2023-11-19 MED ORDER — LIDOCAINE 2% (20 MG/ML) 5 ML SYRINGE
INTRAMUSCULAR | Status: AC
  Filled 2023-11-19: qty 5

## 2023-11-19 MED ORDER — LIDOCAINE 2% (20 MG/ML) 5 ML SYRINGE
INTRAMUSCULAR | Status: DC | PRN
  Administered 2023-11-19: 20 mg via INTRAVENOUS

## 2023-11-19 MED ORDER — AMISULPRIDE (ANTIEMETIC) 5 MG/2ML IV SOLN
10.0000 mg | Freq: Once | INTRAVENOUS | Status: AC | PRN
  Administered 2023-11-19: 10 mg via INTRAVENOUS

## 2023-11-19 MED ORDER — BUPIVACAINE HCL (PF) 0.75 % IJ SOLN
INTRAMUSCULAR | Status: AC
  Filled 2023-11-19: qty 10

## 2023-11-19 MED ORDER — NA CHONDROIT SULF-NA HYALURON 40-30 MG/ML IO SOSY
INTRAOCULAR | Status: AC
Start: 2023-11-19 — End: ?
  Filled 2023-11-19: qty 1

## 2023-11-19 MED ORDER — ROCURONIUM BROMIDE 10 MG/ML (PF) SYRINGE
PREFILLED_SYRINGE | INTRAVENOUS | Status: DC | PRN
  Administered 2023-11-19: 30 mg via INTRAVENOUS
  Administered 2023-11-19: 20 mg via INTRAVENOUS
  Administered 2023-11-19: 30 mg via INTRAVENOUS

## 2023-11-19 MED ORDER — ACETAMINOPHEN 500 MG PO TABS
1000.0000 mg | ORAL_TABLET | Freq: Once | ORAL | Status: AC
  Administered 2023-11-19: 1000 mg via ORAL
  Filled 2023-11-19: qty 2

## 2023-11-19 MED ORDER — TRIAMCINOLONE ACETONIDE 40 MG/ML IJ SUSP
INTRAMUSCULAR | Status: DC | PRN
  Administered 2023-11-19: 40 mg

## 2023-11-19 MED ORDER — BRIMONIDINE TARTRATE 0.2 % OP SOLN
OPHTHALMIC | Status: AC
  Filled 2023-11-19: qty 5

## 2023-11-19 MED ORDER — DEXAMETHASONE SODIUM PHOSPHATE 10 MG/ML IJ SOLN
INTRAMUSCULAR | Status: DC | PRN
  Administered 2023-11-19: 10 mg via INTRAVENOUS

## 2023-11-19 MED ORDER — BRIMONIDINE TARTRATE 0.2 % OP SOLN
OPHTHALMIC | Status: DC | PRN
  Administered 2023-11-19: 1 [drp] via OPHTHALMIC

## 2023-11-19 MED ORDER — ONDANSETRON HCL 4 MG/2ML IJ SOLN
INTRAMUSCULAR | Status: DC | PRN
  Administered 2023-11-19: 4 mg via INTRAVENOUS

## 2023-11-19 MED ORDER — CHLORHEXIDINE GLUCONATE 0.12 % MT SOLN
OROMUCOSAL | Status: AC
  Administered 2023-11-19: 15 mL via OROMUCOSAL
  Filled 2023-11-19: qty 15

## 2023-11-19 MED ORDER — SUGAMMADEX SODIUM 200 MG/2ML IV SOLN
INTRAVENOUS | Status: DC | PRN
  Administered 2023-11-19 (×2): 100 mg via INTRAVENOUS

## 2023-11-19 MED ORDER — PHENYLEPHRINE HCL-NACL 20-0.9 MG/250ML-% IV SOLN
INTRAVENOUS | Status: DC | PRN
  Administered 2023-11-19: 20 ug/min via INTRAVENOUS

## 2023-11-19 MED ORDER — SODIUM CHLORIDE 0.9 % IV SOLN: INTRAVENOUS | Status: DC

## 2023-11-19 MED ORDER — PROMETHAZINE HCL 25 MG PO TABS: 12.5000 mg | ORAL_TABLET | Freq: Once | ORAL | Status: AC

## 2023-11-19 MED ORDER — BACITRACIN-POLYMYXIN B 500-10000 UNIT/GM OP OINT
TOPICAL_OINTMENT | OPHTHALMIC | Status: DC | PRN
Start: 2023-11-19 — End: 2023-11-19
  Administered 2023-11-19: 1 via OPHTHALMIC

## 2023-11-19 MED ORDER — POLYMYXIN B SULFATE 500000 UNITS IJ SOLR
INTRAMUSCULAR | Status: AC
Start: 2023-11-19 — End: ?
  Filled 2023-11-19: qty 10

## 2023-11-19 MED ORDER — STERILE WATER FOR INJECTION IJ SOLN
INTRAMUSCULAR | Status: DC | PRN
  Administered 2023-11-19: 20 mL

## 2023-11-19 MED ORDER — TRIAMCINOLONE ACETONIDE 40 MG/ML IJ SUSP
INTRAMUSCULAR | Status: AC
  Filled 2023-11-19: qty 5

## 2023-11-19 MED ORDER — PROPARACAINE HCL 0.5 % OP SOLN
1.0000 [drp] | OPHTHALMIC | Status: AC | PRN
  Administered 2023-11-19 (×3): 1 [drp] via OPHTHALMIC
  Filled 2023-11-19: qty 15

## 2023-11-19 MED ORDER — BSS IO SOLN
INTRAOCULAR | Status: DC | PRN
  Administered 2023-11-19: 15 mL via INTRAOCULAR

## 2023-11-19 MED ORDER — BSS PLUS IO SOLN
INTRAOCULAR | Status: AC
  Filled 2023-11-19: qty 500

## 2023-11-19 MED ORDER — MIDAZOLAM HCL 2 MG/2ML IJ SOLN
INTRAMUSCULAR | Status: DC | PRN
  Administered 2023-11-19: 2 mg via INTRAVENOUS

## 2023-11-19 MED ORDER — PROMETHAZINE HCL 12.5 MG RE SUPP: 12.5000 mg | Freq: Once | RECTAL | Status: AC

## 2023-11-19 MED ORDER — PROPOFOL 10 MG/ML IV BOLUS
INTRAVENOUS | Status: AC
  Filled 2023-11-19: qty 20

## 2023-11-19 MED ORDER — DEXAMETHASONE SODIUM PHOSPHATE 10 MG/ML IJ SOLN
INTRAMUSCULAR | Status: AC
  Filled 2023-11-19: qty 1

## 2023-11-19 MED ORDER — ROCURONIUM BROMIDE 10 MG/ML (PF) SYRINGE
PREFILLED_SYRINGE | INTRAVENOUS | Status: AC
  Filled 2023-11-19: qty 10

## 2023-11-19 MED ORDER — NA CHONDROIT SULF-NA HYALURON 40-30 MG/ML IO SOSY
INTRAOCULAR | Status: DC | PRN
  Administered 2023-11-19: .5 mL via INTRAOCULAR

## 2023-11-19 MED ORDER — CHLORHEXIDINE GLUCONATE 0.12 % MT SOLN: 15.0000 mL | Freq: Once | OROMUCOSAL | Status: AC

## 2023-11-19 MED ORDER — EPINEPHRINE PF 1 MG/ML IJ SOLN
INTRAMUSCULAR | Status: AC
Start: 2023-11-19 — End: ?
  Filled 2023-11-19: qty 1

## 2023-11-19 MED ORDER — STERILE WATER FOR INJECTION IJ SOLN
INTRAMUSCULAR | Status: AC
  Filled 2023-11-19: qty 10

## 2023-11-19 MED ORDER — PREDNISOLONE ACETATE 1 % OP SUSP
OPHTHALMIC | Status: AC
  Filled 2023-11-19: qty 5

## 2023-11-19 MED ORDER — BSS IO SOLN
INTRAOCULAR | Status: AC
Start: 2023-11-19 — End: ?
  Filled 2023-11-19: qty 15

## 2023-11-19 MED ORDER — MIDAZOLAM HCL 2 MG/2ML IJ SOLN
INTRAMUSCULAR | Status: AC
  Filled 2023-11-19: qty 2

## 2023-11-19 MED ORDER — GATIFLOXACIN 0.5 % OP SOLN
OPHTHALMIC | Status: DC | PRN
  Administered 2023-11-19: 1 [drp] via OPHTHALMIC

## 2023-11-19 MED ORDER — KETOROLAC TROMETHAMINE 0.5 % OP SOLN
1.0000 [drp] | OPHTHALMIC | Status: AC
  Administered 2023-11-19: 1 [drp] via OPHTHALMIC
  Filled 2023-11-19: qty 5

## 2023-11-19 MED ORDER — CEFTAZIDIME 1 G IJ SOLR
INTRAMUSCULAR | Status: AC
  Filled 2023-11-19: qty 1

## 2023-11-19 MED ORDER — DORZOLAMIDE HCL-TIMOLOL MAL 2-0.5 % OP SOLN
OPHTHALMIC | Status: AC
  Filled 2023-11-19: qty 10

## 2023-11-19 MED ORDER — GLYCOPYRROLATE 0.2 MG/ML IJ SOLN
INTRAMUSCULAR | Status: DC | PRN
  Administered 2023-11-19: .2 mg via INTRAVENOUS

## 2023-11-19 MED ORDER — ORAL CARE MOUTH RINSE: 15.0000 mL | Freq: Once | OROMUCOSAL | Status: AC

## 2023-11-19 MED ORDER — SODIUM CHLORIDE (PF) 0.9 % IJ SOLN
INTRAMUSCULAR | Status: AC
  Filled 2023-11-19: qty 10

## 2023-11-19 MED ORDER — FENTANYL CITRATE (PF) 250 MCG/5ML IJ SOLN
INTRAMUSCULAR | Status: DC | PRN
  Administered 2023-11-19: 25 ug via INTRAVENOUS
  Administered 2023-11-19: 50 ug via INTRAVENOUS

## 2023-11-19 MED ORDER — PROPOFOL 10 MG/ML IV BOLUS
INTRAVENOUS | Status: DC | PRN
  Administered 2023-11-19: 100 mg via INTRAVENOUS

## 2023-11-19 MED ORDER — BACITRACIN-POLYMYXIN B 500-10000 UNIT/GM OP OINT
TOPICAL_OINTMENT | OPHTHALMIC | Status: AC
Start: 2023-11-19 — End: ?
  Filled 2023-11-19: qty 3.5

## 2023-11-19 MED ORDER — FENTANYL CITRATE (PF) 100 MCG/2ML IJ SOLN: 25.0000 ug | INTRAMUSCULAR | Status: DC | PRN

## 2023-11-19 MED ORDER — TROPICAMIDE 1 % OP SOLN
1.0000 [drp] | OPHTHALMIC | Status: AC | PRN
  Administered 2023-11-19 (×3): 1 [drp] via OPHTHALMIC
  Filled 2023-11-19: qty 15

## 2023-11-19 MED ORDER — PHENYLEPHRINE 80 MCG/ML (10ML) SYRINGE FOR IV PUSH (FOR BLOOD PRESSURE SUPPORT)
PREFILLED_SYRINGE | INTRAVENOUS | Status: DC | PRN
  Administered 2023-11-19 (×2): 40 ug via INTRAVENOUS
  Administered 2023-11-19: 80 ug via INTRAVENOUS

## 2023-11-19 MED ORDER — ATROPINE SULFATE 1 % OP SOLN
OPHTHALMIC | Status: DC | PRN
  Administered 2023-11-19: 1 [drp] via OPHTHALMIC

## 2023-11-19 MED ORDER — DORZOLAMIDE HCL-TIMOLOL MAL 2-0.5 % OP SOLN
OPHTHALMIC | Status: DC | PRN
Start: 2023-11-19 — End: 2023-11-19
  Administered 2023-11-19: 1 [drp] via OPHTHALMIC

## 2023-11-19 MED ORDER — PROMETHAZINE HCL 25 MG/ML IJ SOLN
INTRAMUSCULAR | Status: AC
  Filled 2023-11-19: qty 1

## 2023-11-19 MED ORDER — ATROPINE SULFATE 1 % OP SOLN
1.0000 [drp] | OPHTHALMIC | Status: AC | PRN
  Administered 2023-11-19 (×3): 1 [drp] via OPHTHALMIC
  Filled 2023-11-19: qty 2

## 2023-11-19 MED ORDER — PREDNISOLONE ACETATE 1 % OP SUSP
OPHTHALMIC | Status: DC | PRN
  Administered 2023-11-19: 1 [drp] via OPHTHALMIC

## 2023-11-19 MED ORDER — PHENYLEPHRINE HCL 10 % OP SOLN
1.0000 [drp] | OPHTHALMIC | Status: AC | PRN
  Administered 2023-11-19 (×3): 1 [drp] via OPHTHALMIC
  Filled 2023-11-19: qty 5

## 2023-11-19 MED ORDER — LIDOCAINE HCL 1 % IJ SOLN
INTRAMUSCULAR | Status: AC
Start: 2023-11-19 — End: ?
  Filled 2023-11-19: qty 20

## 2023-11-19 MED ORDER — ATROPINE SULFATE 1 % OP SOLN
OPHTHALMIC | Status: AC
Start: 2023-11-19 — End: ?
  Filled 2023-11-19: qty 5

## 2023-11-19 MED ORDER — SODIUM CHLORIDE 0.9 % IV SOLN
12.5000 mg | Freq: Once | INTRAVENOUS | Status: AC
  Administered 2023-11-19: 12.5 mg via INTRAVENOUS

## 2023-11-19 MED ORDER — AMISULPRIDE (ANTIEMETIC) 5 MG/2ML IV SOLN
INTRAVENOUS | Status: AC
  Filled 2023-11-19: qty 4

## 2023-11-19 MED ORDER — CARBACHOL 0.01 % IO SOLN
INTRAOCULAR | Status: AC
  Filled 2023-11-19: qty 1.5

## 2023-11-19 MED ORDER — BRILLIANT BLUE G 0.025 % IO SOSY
0.5000 mL | PREFILLED_SYRINGE | INTRAOCULAR | Status: AC
  Administered 2023-11-19: .5 mL via INTRAVITREAL
  Filled 2023-11-19: qty 0.5

## 2023-11-19 MED ORDER — PHENYLEPHRINE 80 MCG/ML (10ML) SYRINGE FOR IV PUSH (FOR BLOOD PRESSURE SUPPORT)
PREFILLED_SYRINGE | INTRAVENOUS | Status: AC
  Filled 2023-11-19: qty 10

## 2023-11-19 MED ORDER — GATIFLOXACIN 0.5 % OP SOLN
OPHTHALMIC | Status: AC
  Filled 2023-11-19: qty 2.5

## 2023-11-19 SURGICAL SUPPLY — 53 items
APPLICATOR COTTON TIP 6 STRL (MISCELLANEOUS) ×4 IMPLANT
APPLICATOR COTTON TIP 6IN STRL (MISCELLANEOUS) ×4 IMPLANT
BAND WRIST GAS GREEN (MISCELLANEOUS) IMPLANT
BLADE EYE CATARACT 19 1.4 BEAV (BLADE) IMPLANT
BNDG EYE OVAL 2 1/8 X 2 5/8 (GAUZE/BANDAGES/DRESSINGS) ×1 IMPLANT
CABLE BIPOLOR RESECTION CORD (MISCELLANEOUS) ×1 IMPLANT
CANNULA ANT CHAM MAIN (OPHTHALMIC RELATED) IMPLANT
CANNULA DUALBORE 25G (CANNULA) ×1 IMPLANT
CANNULA FLEX TIP 25G (CANNULA) ×1 IMPLANT
CLSR STERI-STRIP ANTIMIC 1/2X4 (GAUZE/BANDAGES/DRESSINGS) ×1 IMPLANT
DRAPE INCISE 51X51 W/FILM STRL (DRAPES) ×1 IMPLANT
DRAPE MICROSCOPE LEICA 46X105 (MISCELLANEOUS) ×1 IMPLANT
DRAPE OPHTHALMIC 77X100 STRL (CUSTOM PROCEDURE TRAY) ×1 IMPLANT
FILTER STRAW FLUID ASPIR (MISCELLANEOUS) IMPLANT
FORCEPS GRIESHABER ILM 25G A (INSTRUMENTS) IMPLANT
FORCEPS GRIESHABER MAX 25G (MISCELLANEOUS) IMPLANT
GAS AUTO FILL CONSTELLATION (OPHTHALMIC) IMPLANT
GLOVE BIO SURGEON STRL SZ7.5 (GLOVE) ×2 IMPLANT
GLOVE BIOGEL M 7.0 STRL (GLOVE) ×1 IMPLANT
GOWN STRL REUS W/ TWL LRG LVL3 (GOWN DISPOSABLE) ×2 IMPLANT
GOWN STRL REUS W/ TWL XL LVL3 (GOWN DISPOSABLE) ×1 IMPLANT
KIT BASIN OR (CUSTOM PROCEDURE TRAY) ×1 IMPLANT
KIT PERFLUORON PROCEDURE 5ML (MISCELLANEOUS) IMPLANT
LENS VITRECTOMY FLAT OCLR DISP (MISCELLANEOUS) IMPLANT
LOOP FINESSE 25 GA (MISCELLANEOUS) IMPLANT
NDL 18GX1X1/2 (RX/OR ONLY) (NEEDLE) ×2 IMPLANT
NDL 25GX 5/8IN NON SAFETY (NEEDLE) ×1 IMPLANT
NDL FILTER BLUNT 18X1 1/2 (NEEDLE) ×1 IMPLANT
NDL HYPO 30X.5 LL (NEEDLE) ×2 IMPLANT
NEEDLE 18GX1X1/2 (RX/OR ONLY) (NEEDLE) ×2 IMPLANT
NEEDLE 25GX 5/8IN NON SAFETY (NEEDLE) ×1 IMPLANT
NEEDLE FILTER BLUNT 18X1 1/2 (NEEDLE) ×1 IMPLANT
NEEDLE HYPO 30X.5 LL (NEEDLE) ×2 IMPLANT
NS IRRIG 1000ML POUR BTL (IV SOLUTION) ×1 IMPLANT
PACK VITRECTOMY CUSTOM (CUSTOM PROCEDURE TRAY) ×1 IMPLANT
PAD ARMBOARD POSITIONER FOAM (MISCELLANEOUS) ×2 IMPLANT
PAK PIK VITRECTOMY CVS 25GA (OPHTHALMIC) ×1 IMPLANT
PENCIL BIPOLAR 25GA STR DISP (OPHTHALMIC RELATED) IMPLANT
PROBE ENDO DIATHERMY 25G (MISCELLANEOUS) IMPLANT
PROBE LASER ILLUM FLEX CVD 25G (OPHTHALMIC) IMPLANT
REPL STRA BRUSH NDL (NEEDLE) IMPLANT
REPL STRA BRUSH NEEDLE (NEEDLE) IMPLANT
RESERVOIR BACK FLUSH (MISCELLANEOUS) IMPLANT
RETRACTOR IRIS FLEX 25G GRIESH (INSTRUMENTS) IMPLANT
SCISSORS TIP ADVANCED DSP 25GA (INSTRUMENTS) IMPLANT
SET INJECTOR OIL FLUID CONSTEL (OPHTHALMIC) IMPLANT
SUT VICRYL 7 0 TG140 8 (SUTURE) ×1 IMPLANT
SYR 10ML LL (SYRINGE) ×2 IMPLANT
SYR TB 1ML LUER SLIP (SYRINGE) ×2 IMPLANT
TOWEL GREEN STERILE FF (TOWEL DISPOSABLE) ×1 IMPLANT
TRAY FOLEY W/BAG SLVR 14FR (SET/KITS/TRAYS/PACK) IMPLANT
TUBING HIGH PRESS EXTEN 6IN (TUBING) ×1 IMPLANT
WATER STERILE IRR 1000ML POUR (IV SOLUTION) ×1 IMPLANT

## 2023-11-19 NOTE — Progress Notes (Incomplete)
 [facility] - Clinic Note  11/20/2023   CHIEF COMPLAINT Patient presents for No chief complaint on file.  HISTORY OF PRESENT ILLNESS: Jasmine Fox is a 63 y.o. female who presents to the clinic today for:   Pt wants to proceed with surgery for her ERM next Thursday, she has not noticed any change in vision  Referring physician: Patria Bookbinder, OD [address] [place] [place],  [place] [address]  HISTORICAL INFORMATION:  Selected notes from the MEDICAL RECORD NUMBER Referred by Dr. Patria Bookbinder for ERM / mac hole OD LEE:  Ocular Hx- PMH-   CURRENT MEDICATIONS: No current facility-administered medications for this visit. (Ophthalmic Drugs)   No current outpatient medications on file. (Ophthalmic Drugs)   Facility-Administered Medications Ordered in Other Visits (Ophthalmic Drugs)  Medication Route   atropine 1 % ophthalmic solution 1 drop Right Eye   ketorolac (ACULAR) 0.5 % ophthalmic solution 1 drop Right Eye   phenylephrine (NEO-SYNEPHRINE) 10 % ophthalmic solution 1 drop Right Eye   proparacaine (ALCAINE) 0.5 % ophthalmic solution 1 drop Right Eye   tropicamide (MYDRIACYL) 1 % ophthalmic solution 1 drop Right Eye   No current facility-administered medications for this visit. (Other)   No current outpatient medications on file. (Other)   Facility-Administered Medications Ordered in Other Visits (Other)  Medication Route   0.9 %  sodium chloride  infusion Intravenous   REVIEW OF SYSTEMS:   ALLERGIES No Known Allergies  PAST MEDICAL HISTORY Past Medical History:  Diagnosis Date   Constipation    Migraine    none since 2013   Urolithiasis    Past Surgical History:  Procedure Laterality Date   BREAST BIOPSY Left 08/23/2008   DUCTAL PAPILLOMA WITH CALCIFICATIONS/ FIBROCYSTIC CHANGES AND CALCIFICATIONS   CATARACT EXTRACTION Left 07/27/2018   COLONOSCOPY N/A 11/10/2012   Procedure: COLONOSCOPY;  Surgeon: Suzette Espy, MD;  [facility];  Service: Endoscopy;  Laterality: N/A;  11:15   COLONOSCOPY WITH PROPOFOL  N/A 03/09/2023   Procedure: COLONOSCOPY WITH PROPOFOL ;  Surgeon: Suzette Espy, MD;  [facility];  Service: Endoscopy;  Laterality: N/A;  9:30 AM, ASA 2   ESOPHAGOGASTRODUODENOSCOPY  02/18/2002   [id number] esophagus/s/p 54 French Maloney dilator   KIDNEY STONE SURGERY     lt bunion surgery     POLYPECTOMY  03/09/2023   Procedure: POLYPECTOMY INTESTINAL;  Surgeon: Suzette Espy, MD;  [facility];  Service: Endoscopy;;   FAMILY HISTORY Family History  Problem Relation Age of Onset   Heart disease Mother        congestive heart failure   Diabetes Father    Hypertension Father    Colonic polyp Father    Cancer Brother        brain cancer   Hypertension Brother    Colon cancer Neg Hx    SOCIAL HISTORY Social History   Tobacco Use   Smoking status: Former   Smokeless tobacco: Never   Tobacco comments:    Only about 2 packs cigarettes in lifetime  Vaping Use   Vaping status: Never Used  Substance Use Topics   Alcohol use: No    Alcohol/week: 0.0 standard drinks of alcohol   Drug use: No       OPHTHALMIC EXAM:  Not recorded    IMAGING AND PROCEDURES  Imaging and Procedures for 11/20/2023         ASSESSMENT/PLAN:   ICD-10-CM   1. Epiretinal membrane (  ERM) of right eye  H35.371     2. Pseudophakia, both eyes  Z96.1     3. PCO (posterior capsular opacification), right  H26.491      Epiretinal membrane, right eye  - ERM with central cystic changes and central retinal thickening - BCVA stable at 20/50 - +metamorphopsia - recommend PPV w/ membrane peel and gas injection OD - RBA of procedure discussed, questions answered - informed consent obtained and signed - surgery scheduled for Thursday, Nov 19, 2023, [facility] OR 8, 11:30am - f/u Friday, May 30th, POV - DFE OD only, no OCT  2. Pseudophakia OU  - s/p CE/IOL OU (Dr. Lasandra Points, 2020)  - IOLs in good  position  - s/p Yag Cap OD (01.06.25) -- good PC opening - monitor   Ophthalmic Meds Ordered this visit:  No orders of the defined types were placed in this encounter.    No follow-ups on file.  There are no Patient Instructions on file for this visit.  This document serves as a record of services personally performed by Jeanice Millard, MD, PhD. It was created on their behalf by Eller Gut COT, an ophthalmic technician. The creation of this record is the provider's dictation and/or activities during the visit.    Electronically signed by: Eller Gut COT 05.29.2025  9:31 AM    Abbreviations: M myopia (nearsighted); A astigmatism; H hyperopia (farsighted); P presbyopia; Mrx spectacle prescription;  CTL contact lenses; OD right eye; OS left eye; OU both eyes  XT exotropia; ET esotropia; PEK punctate epithelial keratitis; PEE punctate epithelial erosions; DES dry eye syndrome; MGD meibomian gland dysfunction; ATs artificial tears; PFAT's preservative free artificial tears; NSC nuclear sclerotic cataract; PSC posterior subcapsular cataract; ERM epi-retinal membrane; PVD posterior vitreous detachment; RD retinal detachment; DM diabetes mellitus; DR diabetic retinopathy; NPDR non-proliferative diabetic retinopathy; PDR proliferative diabetic retinopathy; CSME clinically significant macular edema; DME diabetic macular edema; dbh dot blot hemorrhages; CWS cotton wool spot; POAG primary open angle glaucoma; C/D cup-to-disc ratio; HVF humphrey visual field; GVF goldmann visual field; OCT optical coherence tomography; IOP intraocular pressure; BRVO Branch retinal vein occlusion; CRVO central retinal vein occlusion; CRAO central retinal artery occlusion; BRAO branch retinal artery occlusion; RT retinal tear; SB scleral buckle; PPV pars plana vitrectomy; VH Vitreous hemorrhage; PRP panretinal laser photocoagulation; IVK intravitreal kenalog; VMT vitreomacular traction; MH Macular hole;  NVD  neovascularization of the disc; NVE neovascularization elsewhere; AREDS age related eye disease study; ARMD age related macular degeneration; POAG primary open angle glaucoma; EBMD epithelial/anterior basement membrane dystrophy; ACIOL anterior chamber intraocular lens; IOL intraocular lens; PCIOL posterior chamber intraocular lens; Phaco/IOL phacoemulsification with intraocular lens placement; PRK photorefractive keratectomy; LASIK laser assisted in situ keratomileusis; HTN hypertension; DM diabetes mellitus; COPD chronic obstructive pulmonary disease

## 2023-11-19 NOTE — Transfer of Care (Signed)
 Immediate Anesthesia Transfer of Care Note  Patient: Jasmine Fox  Procedure(s) Performed: PARS PLANA VITRECTOMY WITH 25 GAUGE WITH REMOVAL OF INTERNAL LIMITING MEMBRANE OF RETINA WITH INTRAOCULAR TAMPONADE (Right)  Patient Location: PACU  Anesthesia Type:General  Level of Consciousness: awake  Airway & Oxygen Therapy: Patient Spontanous Breathing and Patient connected to face mask oxygen  Post-op Assessment: Report given to RN, Post -op Vital signs reviewed and stable, Patient moving all extremities, Patient moving all extremities X 4, and Patient able to stick tongue midline  Post vital signs: Reviewed and stable  Last Vitals:  Vitals Value Taken Time  BP 99/63 11/19/23 1434  Temp    Pulse 77 11/19/23 1436  Resp 16 11/19/23 1436  SpO2 95 % 11/19/23 1436  Vitals shown include unfiled device data.  Last Pain:  Vitals:   11/19/23 0908  TempSrc:   PainSc: 0-No pain         Complications: No notable events documented.

## 2023-11-19 NOTE — Anesthesia Postprocedure Evaluation (Signed)
 Anesthesia Post Note  Patient: TASHIA LEITERMAN  Procedure(s) Performed: PARS PLANA VITRECTOMY WITH 25 GAUGE WITH REMOVAL OF INTERNAL LIMITING MEMBRANE OF RETINA WITH INTRAOCULAR TAMPONADE (Right)     Patient location during evaluation: PACU Anesthesia Type: General Level of consciousness: awake and alert Pain management: pain level controlled Vital Signs Assessment: post-procedure vital signs reviewed and stable Respiratory status: spontaneous breathing, nonlabored ventilation, respiratory function stable and patient connected to nasal cannula oxygen Cardiovascular status: blood pressure returned to baseline and stable Postop Assessment: no apparent nausea or vomiting Anesthetic complications: no  No notable events documented.  Last Vitals:  Vitals:   11/19/23 1545 11/19/23 1551  BP: 110/61 112/66  Pulse: 60 61  Resp: 16 16  Temp:  36.9 C  SpO2: 94% 95%    Last Pain:  Vitals:   11/19/23 1551  TempSrc:   PainSc: 0-No pain                 Melvenia Stabs

## 2023-11-19 NOTE — Anesthesia Preprocedure Evaluation (Signed)
 Anesthesia Evaluation  Patient identified by MRN, date of birth, ID band Patient awake    Reviewed: Allergy & Precautions, NPO status , Patient's Chart, lab work & pertinent test results  Airway Mallampati: II  TM Distance: >3 FB Neck ROM: Full    Dental  (+) Dental Advisory Given   Pulmonary former smoker   breath sounds clear to auscultation       Cardiovascular negative cardio ROS  Rhythm:Regular Rate:Normal     Neuro/Psych  Headaches    GI/Hepatic negative GI ROS, Neg liver ROS,,,  Endo/Other  negative endocrine ROS    Renal/GU negative Renal ROS     Musculoskeletal   Abdominal   Peds  Hematology negative hematology ROS (+)   Anesthesia Other Findings   Reproductive/Obstetrics                             Anesthesia Physical Anesthesia Plan  ASA: 1  Anesthesia Plan: General   Post-op Pain Management: Tylenol PO (pre-op)*   Induction: Intravenous  PONV Risk Score and Plan: 3 and Dexamethasone, Ondansetron  and Treatment may vary due to age or medical condition  Airway Management Planned: Oral ETT  Additional Equipment:   Intra-op Plan:   Post-operative Plan: Extubation in OR  Informed Consent: I have reviewed the patients History and Physical, chart, labs and discussed the procedure including the risks, benefits and alternatives for the proposed anesthesia with the patient or authorized representative who has indicated his/her understanding and acceptance.     Dental advisory given  Plan Discussed with: CRNA  Anesthesia Plan Comments:         Anesthesia Quick Evaluation

## 2023-11-19 NOTE — Discharge Instructions (Addendum)
POSTOPERATIVE INSTRUCTIONS  Your doctor has performed vitreoretinal surgery on you at [facility].  - Keep eye patched and shielded until seen by Dr. Vanessa Barbara 8 AM tomorrow in clinic - Do not use drops until return - FACE DOWN POSITIONING WHILE AWAKE - Sleep with belly down or on LEFT side, avoid laying flat on back.    - No strenuous bending, stooping or lifting.  - You may not drive until further notice.  - If your doctor used a gas bubble in your eye during the procedure he will advise you on postoperative positioning. If you have a gas bubble you will be wearing a green bracelet that was applied in the operating room. The green bracelet should stay on as long as the gas bubble is in your eye. While the gas bubble is present you should not fly in an airplane. If you require general anesthesia while the gas bubble is present you must notify your anesthesiologist that an intraocular gas bubble is present so he can take the appropriate precautions.  - Tylenol or any other over-the-counter pain reliever can be used according to your doctor. If more pain medicine is required, your doctor will have a prescription for you.  - You may read, go up and down stairs, and watch television.     Rennis Chris, M.D., Ph.D.

## 2023-11-19 NOTE — Brief Op Note (Signed)
 11/19/2023  2:31 PM  PATIENT:  Jasmine Fox  63 y.o. female  PRE-OPERATIVE DIAGNOSIS:  Epiretinal membrane, right eye  POST-OPERATIVE DIAGNOSIS:  Epiretinal membrane, right eye  PROCEDURE:  Procedure(s) with comments: PARS PLANA VITRECTOMY WITH 25 GAUGE WITH REMOVAL OF INTERNAL LIMITING MEMBRANE OF RETINA WITH INTRAOCULAR TAMPONADE (Right) - C3F8 gas insertion  SURGEON:  Surgeons and Role:    Ronelle Coffee, MD - Primary  ASSISTANTS: Argyle Began, Ophthalmic Assistant    ANESTHESIA:   general  EBL:  minimal   BLOOD ADMINISTERED:none  DRAINS: none   LOCAL MEDICATIONS USED:  NONE  SPECIMEN:  No Specimen  DISPOSITION OF SPECIMEN:  N/A  COUNTS:  YES  TOURNIQUET:  * No tourniquets in log *  DICTATION: .Note written in EPIC  PLAN OF CARE: Discharge to home after PACU  PATIENT DISPOSITION:  PACU - hemodynamically stable.   Delay start of Pharmacological VTE agent (>24hrs) due to surgical blood loss or risk of bleeding: not applicable

## 2023-11-19 NOTE — Interval H&P Note (Signed)
 History and Physical Interval Note:  11/19/2023 11:44 AM  Jasmine Fox  has presented today for surgery, with the diagnosis of Epiretinal membrane, right eye.  The various methods of treatment have been discussed with the patient and family. After consideration of risks, benefits and other options for treatment, the patient has consented to  Procedure(s): PARS PLANA VITRECTOMY WITH 25 GAUGE WITH REMOVAL OF INTERNAL LIMITING MEMBRANE OF RETINA WITH INTRAOCULAR TAMPONADE (Right) as a surgical intervention.  The patient's history has been reviewed, patient examined, no change in status, stable for surgery.  I have reviewed the patient's chart and labs.  Questions were answered to the patient's satisfaction.     Ronelle Coffee

## 2023-11-19 NOTE — Anesthesia Procedure Notes (Signed)
 Procedure Name: Intubation Date/Time: 11/19/2023 12:21 PM  Performed by: Hebert Littler, CRNAPre-anesthesia Checklist: Patient identified, Emergency Drugs available, Suction available and Patient being monitored Patient Re-evaluated:Patient Re-evaluated prior to induction Oxygen Delivery Method: Circle System Utilized Preoxygenation: Pre-oxygenation with 100% oxygen Induction Type: IV induction Ventilation: Mask ventilation without difficulty Laryngoscope Size: Miller and 2 Grade View: Grade I Tube type: Oral Tube size: 7.0 mm Number of attempts: 1 Airway Equipment and Method: Stylet and Oral airway Placement Confirmation: ETT inserted through vocal cords under direct vision, positive ETCO2 and breath sounds checked- equal and bilateral Secured at: 20.5 cm Tube secured with: Tape Dental Injury: Teeth and Oropharynx as per pre-operative assessment

## 2023-11-19 NOTE — Op Note (Signed)
 Date of procedure:  05.29.2025   Surgeon: Ronelle Coffee, MD, PhD  Assistant: Argyle Began, Ophthalmic Assistant   Pre-operative Diagnoses:  Epiretinal membrane, right eye   Post-operative diagnosis:  Epiretinal membrane, right eye   Anesthesia: General   Procedure:   1. 25 gauge pars plana vitrectomy, Right Eye 2. TissueBlue stain, Right Eye 3. Epiretinal membrane and Internal Limiting Membrane peel, Right Eye  4. Endolaser, Right Eye 5. Injection 20% SF6, Right Eye    Indications for procedure:  The patient presented with decreased vision and distortion due to ERM in the right eye. After discussing the risks, benefits, and alternatives to surgery, the patient electively decided to undergo surgical repair and informed consent was obtained.  The surgery was an attempt to remove the epiretinal membrane and potentially improve the vision within the reasonable expectations of the surgeon.    Procedure in Detail:  The patient was met in the pre-operative holding area where their identification data was verified.  It was noted that there was a signed, informed consent in the chart and the Right Eye eye was verbally verified by the patient as the operative eye and was marked with a marking pen. The patient was then taken to the operating room and placed in the supine position. General endotracheal anesthesia was induced.  The eye was then prepped with 5% betadine and draped in the normal fashion for ophthalmic surgery. The microscope was draped and swung into position, and a secondary time-out was performed to identify the correct patient, eyes, procedures, and any allergies.   A 25 gauge trocar was inserted in a 30-45 degrees fashion into the inferotemporal quadrant 3.5 mm posterior to the limbus in this pseudophakic patient.  Correct positioning within the vitreous was verified externally with the light pipe.  The infusion was then connected to the cannula and BSS infusion was commenced.   Additional ports were placed in the superonasal and superotemporal quadrants. Viscoat was placed on the cornea and direct vitrectomy was performed to remove the anterior vitreous and hyaloid. The BIOM was used to visualize the posterior segment while a core vitrectomy was completed. The patient had a fairly dense ERM with significant striations of the underlying retina. A posterior vitreous detachment was confirmed with the assistance of intravitreal kenalog.   TissueBlue was then used to stain the internal limiting membrane and counter-stain the ERM. A macular contact lens was placed on the eye. End-grasping ILM forceps were used to create an opening in the ILM and the ILM and overlying ERM were peeled fully from the macula taking care to avoid traction on the fovea. TissueBlue was used to restain any residual ILM and the end-grasping ILM forceps were used to peeled the residual ILM fully from the macula. A very wide peel was achieved without complication.   The wide angle viewing system was brought back into position. The vitrectomy probe was used to remove fragments of peeled ERM and ILM. The vitreous base was carefully removed with the assistance of scleral depression. Inspection of the peripheral retina with scleral depression showed now peripheral retinal breaks or tears. The endolaser was then used to apply several rows of prophylactic retinopexy just posterior to the ora 360. Next, a complete fluid-air exchange was performed. Of note, some mild bleeding from the disc was noted during the fluid air exchange. The intraocular pressure of the eye was raised to 60 mmHg to tamponade the bleeding and the bleeding was controlled   The superotemporal port was then removed  and sutured with 7-0 vicryl, there was no leakage. 20% SF6 gas was connected to the infusion line and gas was injected into the posterior segment while venting air through the superonasal trocar using the extrusion cannula. Once a full, 40cc  of gas was vented through the eye, the infusion port and venting ports were removed and they were sutured with 7-0 vicryl.  There was no leakage from the sclerotomy sites.   Subconjunctival injections of kefzol + bacitracin + polymixin b and kenalog were then administered, and antibiotic and steroid drops as well as antibiotic ointment were placed in the eye.  The drapes were removed and the eye was patched and shielded.  A green gas bracelet was placed on the patient's wrist. The patient was then taken to the post-operative area for recovery having tolerated the procedure well.  He was instructed to perform face down positioning postoperatively and to follow up in clinic the following morning as scheduled.   Estimate blood lost: none Complications: None

## 2023-11-20 ENCOUNTER — Ambulatory Visit (INDEPENDENT_AMBULATORY_CARE_PROVIDER_SITE_OTHER): Admitting: Ophthalmology

## 2023-11-20 ENCOUNTER — Encounter (HOSPITAL_COMMUNITY): Payer: Self-pay | Admitting: Ophthalmology

## 2023-11-20 DIAGNOSIS — Z961 Presence of intraocular lens: Secondary | ICD-10-CM

## 2023-11-20 DIAGNOSIS — H35371 Puckering of macula, right eye: Secondary | ICD-10-CM

## 2023-11-20 DIAGNOSIS — H26491 Other secondary cataract, right eye: Secondary | ICD-10-CM

## 2023-11-23 ENCOUNTER — Encounter (INDEPENDENT_AMBULATORY_CARE_PROVIDER_SITE_OTHER): Admitting: Ophthalmology

## 2023-11-26 ENCOUNTER — Ambulatory Visit (INDEPENDENT_AMBULATORY_CARE_PROVIDER_SITE_OTHER): Admitting: Ophthalmology

## 2023-11-26 ENCOUNTER — Encounter (INDEPENDENT_AMBULATORY_CARE_PROVIDER_SITE_OTHER): Payer: Self-pay | Admitting: Ophthalmology

## 2023-11-26 DIAGNOSIS — H35371 Puckering of macula, right eye: Secondary | ICD-10-CM

## 2023-11-26 DIAGNOSIS — Z961 Presence of intraocular lens: Secondary | ICD-10-CM

## 2023-11-26 MED ORDER — PREDNISOLONE ACETATE 1 % OP SUSP: 1.0000 [drp] | Freq: Every day | OPHTHALMIC | 0 refills | Status: DC

## 2023-11-26 MED ORDER — BACITRACIN-POLYMYXIN B 500-10000 UNIT/GM OP OINT: TOPICAL_OINTMENT | Freq: Two times a day (BID) | OPHTHALMIC | 5 refills | Status: DC

## 2023-11-26 NOTE — Progress Notes (Signed)
 [facility] - Clinic Note  11/26/2023   CHIEF COMPLAINT Patient presents for Post-op Follow-up  HISTORY OF PRESENT ILLNESS: Jasmine Fox is a 63 y.o. female who presents to the clinic today for:  HPI     Post-op Follow-up   In right eye.  Discomfort includes pain and foreign body sensation.  Vision is improved.  I, the attending physician,  performed the HPI with the patient and updated documentation appropriately.        Comments   S/P PPV od . Patient states vision is better slight pain feels gritty at times. Patient is still using all drops. Psu 4x day, Atropine  2x day,PF every 2 hours, Zymaxid  4 x day       Last edited by Ronelle Coffee, MD on 12/06/2023  1:59 AM.     Pt states she is starting to see her vision come back in, she is keeping her face down more than 50 mins an hour  Referring physician: Patria Bookbinder, OD [address] [place] [place],  [place] [address]  HISTORICAL INFORMATION:  Selected notes from the MEDICAL RECORD NUMBER Referred by Dr. Patria Bookbinder for ERM / mac hole OD LEE:  Ocular Hx- PMH-   CURRENT MEDICATIONS: Current Outpatient Medications (Ophthalmic Drugs)  Medication Sig   bacitracin -polymyxin b  (POLYSPORIN ) ophthalmic ointment Place into the right eye 2 (two) times daily. Place a 1/2 inch ribbon of ointment into the lower eyelid.   prednisoLONE  acetate (PRED FORTE ) 1 % ophthalmic suspension Place 1 drop into the right eye 6 (six) times daily.   Propylene Glycol (SYSTANE COMPLETE) 0.6 % SOLN Place 1 spray into both eyes as needed (dry eyes).   No current facility-administered medications for this visit. (Ophthalmic Drugs)   Current Outpatient Medications (Other)  Medication Sig   oxymetazoline (AFRIN) 0.05 % nasal spray Place 1 spray into both nostrils 2 (two) times daily as needed for congestion.   No current facility-administered medications for this visit. (Other)   REVIEW OF SYSTEMS: ROS   Positive for:  Eyes Negative for: Constitutional, Gastrointestinal, Neurological, Skin, Genitourinary, Musculoskeletal, HENT, Endocrine, Cardiovascular, Respiratory, Psychiatric, Allergic/Imm, Heme/Lymph Last edited by Leonia Raman, COT on 11/26/2023  7:48 AM.       ALLERGIES No Known Allergies  PAST MEDICAL HISTORY Past Medical History:  Diagnosis Date   Constipation    Migraine    none since 2013   Urolithiasis    Past Surgical History:  Procedure Laterality Date   BREAST BIOPSY Left 08/23/2008   DUCTAL PAPILLOMA WITH CALCIFICATIONS/ FIBROCYSTIC CHANGES AND CALCIFICATIONS   CATARACT EXTRACTION Left 07/27/2018   COLONOSCOPY N/A 11/10/2012   Procedure: COLONOSCOPY;  Surgeon: Suzette Espy, MD;  [facility];  Service: Endoscopy;  Laterality: N/A;  11:15   COLONOSCOPY WITH PROPOFOL  N/A 03/09/2023   Procedure: COLONOSCOPY WITH PROPOFOL ;  Surgeon: Suzette Espy, MD;  Location: [facility];  Service: Endoscopy;  Laterality: N/A;  9:30 AM, ASA 2   ESOPHAGOGASTRODUODENOSCOPY  02/18/2002   ION:GEXBMW esophagus/s/p 54 French Maloney dilator   KIDNEY STONE SURGERY     lt bunion surgery     PARS PLANA VITRECTOMY W/REMOVE OF INTERNAL LIMITING MEMBRANE W/TAMPONADE Right 11/19/2023   Procedure: PARS PLANA VITRECTOMY WITH 25 GAUGE WITH REMOVAL OF INTERNAL LIMITING MEMBRANE OF RETINA WITH INTRAOCULAR TAMPONADE;  Surgeon: Ronelle Coffee, MD;  Location: [facility] OR;  Service: Ophthalmology;  Laterality: Right;  C3F8 gas insertion   POLYPECTOMY  03/09/2023  Procedure: POLYPECTOMY INTESTINAL;  Surgeon: Suzette Espy, MD;  Location: [facility];  Service: Endoscopy;;   FAMILY HISTORY Family History  Problem Relation Age of Onset   Heart disease Mother        congestive heart failure   Diabetes Father    Hypertension Father    Colonic polyp Father    Cancer Brother        brain cancer   Hypertension Brother    Colon cancer Neg Hx    SOCIAL HISTORY Social History   Tobacco Use   Smoking  status: Former   Smokeless tobacco: Never   Tobacco comments:    Only about 2 packs cigarettes in lifetime  Vaping Use   Vaping status: Never Used  Substance Use Topics   Alcohol use: No    Alcohol/week: 0.0 standard drinks of alcohol   Drug use: No       OPHTHALMIC EXAM:  Base Eye Exam     Visual Acuity (Snellen - Linear)       Right Left   Dist Miles 20/70    Dist ph Franquez 20/NI          Tonometry (Tonopen, 7:57 AM)       Right Left   Pressure 10          Neuro/Psych     Oriented x3: Yes   Mood/Affect: Normal         Dilation     Right eye: 1.0% Mydriacyl , 2.5% Phenylephrine  @ 7:57 AM           Slit Lamp and Fundus Exam     Slit Lamp Exam       Right Left   Lids/Lashes Dermatochalasis - upper lid Dermatochalasis - upper lid   Conjunctiva/Sclera Minimal subconjunctival hemorrhage -- improving, sutures intact White and quiet   Cornea arcus, endo heme -- improving, trace PEE trace PEE   Anterior Chamber deep, 2+fine cell deep and clear   Iris Round and moderately dilated Round and dilated   Lens PC IOL in good position, open PC PC IOL in good position with open PC   Anterior Vitreous post vitrectomy, 45% gas bubble syneresis         Fundus Exam       Right Left   Disc mild Pallor, Sharp rim    C/D Ratio 0.3 0.4   Macula flat under gas, ERM gone, rare MA    Vessels attenuated, mild tortuosity    Periphery Attached, mild reticular degeneration, No heme, good early 360 laser changes peripherally            IMAGING AND PROCEDURES  Imaging and Procedures for 11/26/2023         ASSESSMENT/PLAN:   ICD-10-CM   1. Epiretinal membrane (ERM) of right eye  H35.371     2. Pseudophakia, both eyes  Z96.1      Epiretinal membrane, right eye  - ERM with central cystic changes and central retinal thickening - pre-op BCVA 20/50 - +metamorphopsia - POW1 s/p PPV/TissueBlue /MP/20% SF6 OD, 05.29.25             - IOP good at 10             - ERM  gone and good gas bubble in place             - IOP 9             - contt PF 6x/day OD zymaxid  QID OD --  stop on Sunday Atropine  BID OD -- stop PSO ung QID OD  -- dec to BID OD             - cont face down positioning 50% of time for one more week; avoid laying flat on back              - eye shield when sleeping x1 more week             - post op drop and positioning instructions reviewed              - tylenol /ibuprofen for pain  - 2-3 weeks, DFE OD only, no OCT  2. Pseudophakia OU  - s/p CE/IOL OU (Dr. Lasandra Points, 2020)  - IOLs in good position  - s/p Yag Cap OD (01.06.25) -- good PC opening - monitor   Ophthalmic Meds Ordered this visit:  Meds ordered this encounter  Medications   prednisoLONE  acetate (PRED FORTE ) 1 % ophthalmic suspension    Sig: Place 1 drop into the right eye 6 (six) times daily.    Dispense:  15 mL    Refill:  0   bacitracin -polymyxin b  (POLYSPORIN ) ophthalmic ointment    Sig: Place into the right eye 2 (two) times daily. Place a 1/2 inch ribbon of ointment into the lower eyelid.    Dispense:  3.5 g    Refill:  5     Return for f/u 2-3 weeks, ERM OD, DFE, OCT.  There are no Patient Instructions on file for this visit.  Explained the diagnoses, plan, and follow up with the patient and they expressed understanding.  Patient expressed understanding of the importance of proper follow up care.   This document serves as a record of services personally performed by Jeanice Millard, MD, PhD. It was created on their behalf by Morley Arabia. Bevin Bucks, OA an ophthalmic technician. The creation of this record is the provider's dictation and/or activities during the visit.    Electronically signed by: Morley Arabia. Bevin Bucks, OA 12/06/23 1:59 AM  Jeanice Millard, M.D., Ph.D. Diseases & Surgery of the Retina and Vitreous [facility] 11/26/2023  I have reviewed the above documentation for accuracy and completeness, and I agree with the above. Jeanice Millard,  M.D., Ph.D. 12/06/23 2:00 AM   Abbreviations: M myopia (nearsighted); A astigmatism; H hyperopia (farsighted); P presbyopia; Mrx spectacle prescription;  CTL contact lenses; OD right eye; OS left eye; OU both eyes  XT exotropia; ET esotropia; PEK punctate epithelial keratitis; PEE punctate epithelial erosions; DES dry eye syndrome; MGD meibomian gland dysfunction; ATs artificial tears; PFAT's preservative free artificial tears; NSC nuclear sclerotic cataract; PSC posterior subcapsular cataract; ERM epi-retinal membrane; PVD posterior vitreous detachment; RD retinal detachment; DM diabetes mellitus; DR diabetic retinopathy; NPDR non-proliferative diabetic retinopathy; PDR proliferative diabetic retinopathy; CSME clinically significant macular edema; DME diabetic macular edema; dbh dot blot hemorrhages; CWS cotton wool spot; POAG primary open angle glaucoma; C/D cup-to-disc ratio; HVF humphrey visual field; GVF goldmann visual field; OCT optical coherence tomography; IOP intraocular pressure; BRVO Branch retinal vein occlusion; CRVO central retinal vein occlusion; CRAO central retinal artery occlusion; BRAO branch retinal artery occlusion; RT retinal tear; SB scleral buckle; PPV pars plana vitrectomy; VH Vitreous hemorrhage; PRP panretinal laser photocoagulation; IVK intravitreal kenalog ; VMT vitreomacular traction; MH Macular hole;  NVD neovascularization of the disc; NVE neovascularization elsewhere; AREDS age related eye disease study; ARMD age related macular degeneration; POAG primary open angle glaucoma; EBMD  epithelial/anterior basement membrane dystrophy; ACIOL anterior chamber intraocular lens; IOL intraocular lens; PCIOL posterior chamber intraocular lens; Phaco/IOL phacoemulsification with intraocular lens placement; PRK photorefractive keratectomy; LASIK laser assisted in situ keratomileusis; HTN hypertension; DM diabetes mellitus; COPD chronic obstructive pulmonary disease

## 2023-12-04 ENCOUNTER — Telehealth (INDEPENDENT_AMBULATORY_CARE_PROVIDER_SITE_OTHER): Payer: Self-pay

## 2023-12-04 NOTE — Telephone Encounter (Signed)
 Pt called to report gas bubble is gone as of yesterday 6.12.25. Made pt aware all restrictions are lifted. MS

## 2023-12-06 ENCOUNTER — Encounter (INDEPENDENT_AMBULATORY_CARE_PROVIDER_SITE_OTHER): Payer: Self-pay | Admitting: Ophthalmology

## 2023-12-09 NOTE — Progress Notes (Deleted)
 [facility] - Clinic Note  12/10/2023   CHIEF COMPLAINT Patient presents for No chief complaint on file.  HISTORY OF PRESENT ILLNESS: Jasmine Fox is a 63 y.o. female who presents to the clinic today for:    Pt states she is starting to see her vision come back in, she is keeping her face down more than 50 mins an hour  Referring physician: Patria Bookbinder, OD [address] [place],  [place] [address]  HISTORICAL INFORMATION:  Selected notes from the MEDICAL RECORD NUMBER Referred by Dr. Patria Bookbinder for ERM / mac hole OD LEE:  Ocular Hx- PMH-   CURRENT MEDICATIONS: Current Outpatient Medications (Ophthalmic Drugs)  Medication Sig   bacitracin -polymyxin b  (POLYSPORIN ) ophthalmic ointment Place into the right eye 2 (two) times daily. Place a 1/2 inch ribbon of ointment into the lower eyelid.   prednisoLONE  acetate (PRED FORTE ) 1 % ophthalmic suspension Place 1 drop into the right eye 6 (six) times daily.   Propylene Glycol (SYSTANE COMPLETE) 0.6 % SOLN Place 1 spray into both eyes as needed (dry eyes).   No current facility-administered medications for this visit. (Ophthalmic Drugs)   Current Outpatient Medications (Other)  Medication Sig   oxymetazoline (AFRIN) 0.05 % nasal spray Place 1 spray into both nostrils 2 (two) times daily as needed for congestion.   No current facility-administered medications for this visit. (Other)   REVIEW OF SYSTEMS:     ALLERGIES No Known Allergies  PAST MEDICAL HISTORY Past Medical History:  Diagnosis Date   Constipation    Migraine    none since 2013   Urolithiasis    Past Surgical History:  Procedure Laterality Date   BREAST BIOPSY Left 08/23/2008   DUCTAL PAPILLOMA WITH CALCIFICATIONS/ FIBROCYSTIC CHANGES AND CALCIFICATIONS   CATARACT EXTRACTION Left 07/27/2018   COLONOSCOPY N/A 11/10/2012   Procedure: COLONOSCOPY;  Surgeon: Suzette Espy, MD;  [facility];  Service: Endoscopy;   Laterality: N/A;  11:15   COLONOSCOPY WITH PROPOFOL  N/A 03/09/2023   Procedure: COLONOSCOPY WITH PROPOFOL ;  Surgeon: Suzette Espy, MD;  [facility];  Service: Endoscopy;  Laterality: N/A;  9:30 AM, ASA 2   ESOPHAGOGASTRODUODENOSCOPY  02/18/2002   NFA:OZHYQM esophagus/s/p 54 French Maloney dilator   KIDNEY STONE SURGERY     lt bunion surgery     PARS PLANA VITRECTOMY W/REMOVE OF INTERNAL LIMITING MEMBRANE W/TAMPONADE Right 11/19/2023   Procedure: PARS PLANA VITRECTOMY WITH 25 GAUGE WITH REMOVAL OF INTERNAL LIMITING MEMBRANE OF RETINA WITH INTRAOCULAR TAMPONADE;  Surgeon: Ronelle Coffee, MD;  [facility];  Service: Ophthalmology;  Laterality: Right;  C3F8 gas insertion   POLYPECTOMY  03/09/2023   Procedure: POLYPECTOMY INTESTINAL;  Surgeon: Suzette Espy, MD;  [facility];  Service: Endoscopy;;   FAMILY HISTORY Family History  Problem Relation Age of Onset   Heart disease Mother        congestive heart failure   Diabetes Father    Hypertension Father    Colonic polyp Father    Cancer Brother        brain cancer   Hypertension Brother    Colon cancer Neg Hx    SOCIAL HISTORY Social History   Tobacco Use   Smoking status: Former   Smokeless tobacco: Never   Tobacco comments:    Only about 2 packs cigarettes in lifetime  Vaping Use   Vaping status: Never Used  Substance Use Topics   Alcohol  use: No    Alcohol/week: 0.0 standard drinks of alcohol   Drug use: No       OPHTHALMIC EXAM:  Not recorded    IMAGING AND PROCEDURES  Imaging and Procedures for 12/10/2023         ASSESSMENT/PLAN:   ICD-10-CM   1. Epiretinal membrane (ERM) of right eye  H35.371     2. Pseudophakia, both eyes  Z96.1     3. PCO (posterior capsular opacification), right  H26.491      Epiretinal membrane, right eye  - ERM with central cystic changes and central retinal thickening - pre-op BCVA 20/50 - +metamorphopsia - POW3 s/p PPV/TissueBlue /MP/20% SF6 OD,  05.29.25             - IOP good at 10             - ERM gone and good gas bubble in place             - IOP 9             - contt PF 6x/day OD PSO ung QID OD  -- dec to BID OD             - cont face down positioning 50% of time for one more week; avoid laying flat on back              - eye shield when sleeping x1 more week             - post op drop and positioning instructions reviewed              - tylenol /ibuprofen for pain  - 2-3 weeks, DFE OD only, no OCT  2. Pseudophakia OU  - s/p CE/IOL OU (Dr. Lasandra Points, 2020)  - IOLs in good position  - s/p Yag Cap OD (01.06.25) -- good PC opening - monitor   Ophthalmic Meds Ordered this visit:  No orders of the defined types were placed in this encounter.    No follow-ups on file.  There are no Patient Instructions on file for this visit.  Explained the diagnoses, plan, and follow up with the patient and they expressed understanding.  Patient expressed understanding of the importance of proper follow up care.   This document serves as a record of services personally performed by Jeanice Millard, MD, PhD. It was created on their behalf by Morley Arabia. Bevin Bucks, OA an ophthalmic technician. The creation of this record is the provider's dictation and/or activities during the visit.    Electronically signed by: Morley Arabia. Bevin Bucks, OA 12/09/23 8:03 AM   Jeanice Millard, M.D., Ph.D. Diseases & Surgery of the Retina and Vitreous [facility] 12/10/2023    Abbreviations: M myopia (nearsighted); A astigmatism; H hyperopia (farsighted); P presbyopia; Mrx spectacle prescription;  CTL contact lenses; OD right eye; OS left eye; OU both eyes  XT exotropia; ET esotropia; PEK punctate epithelial keratitis; PEE punctate epithelial erosions; DES dry eye syndrome; MGD meibomian gland dysfunction; ATs artificial tears; PFAT's preservative free artificial tears; NSC nuclear sclerotic cataract; PSC posterior subcapsular cataract; ERM  epi-retinal membrane; PVD posterior vitreous detachment; RD retinal detachment; DM diabetes mellitus; DR diabetic retinopathy; NPDR non-proliferative diabetic retinopathy; PDR proliferative diabetic retinopathy; CSME clinically significant macular edema; DME diabetic macular edema; dbh dot blot hemorrhages; CWS cotton wool spot; POAG primary open angle glaucoma; C/D cup-to-disc ratio; HVF humphrey visual field; GVF goldmann visual field; OCT optical coherence tomography; IOP  intraocular pressure; BRVO Branch retinal vein occlusion; CRVO central retinal vein occlusion; CRAO central retinal artery occlusion; BRAO branch retinal artery occlusion; RT retinal tear; SB scleral buckle; PPV pars plana vitrectomy; VH Vitreous hemorrhage; PRP panretinal laser photocoagulation; IVK intravitreal kenalog ; VMT vitreomacular traction; MH Macular hole;  NVD neovascularization of the disc; NVE neovascularization elsewhere; AREDS age related eye disease study; ARMD age related macular degeneration; POAG primary open angle glaucoma; EBMD epithelial/anterior basement membrane dystrophy; ACIOL anterior chamber intraocular lens; IOL intraocular lens; PCIOL posterior chamber intraocular lens; Phaco/IOL phacoemulsification with intraocular lens placement; PRK photorefractive keratectomy; LASIK laser assisted in situ keratomileusis; HTN hypertension; DM diabetes mellitus; COPD chronic obstructive pulmonary disease

## 2023-12-10 ENCOUNTER — Encounter (INDEPENDENT_AMBULATORY_CARE_PROVIDER_SITE_OTHER): Payer: Self-pay | Admitting: Ophthalmology

## 2023-12-10 ENCOUNTER — Encounter (INDEPENDENT_AMBULATORY_CARE_PROVIDER_SITE_OTHER): Payer: Self-pay

## 2023-12-10 ENCOUNTER — Ambulatory Visit (INDEPENDENT_AMBULATORY_CARE_PROVIDER_SITE_OTHER): Admitting: Ophthalmology

## 2023-12-10 DIAGNOSIS — H35371 Puckering of macula, right eye: Secondary | ICD-10-CM

## 2023-12-10 DIAGNOSIS — Z961 Presence of intraocular lens: Secondary | ICD-10-CM

## 2023-12-10 DIAGNOSIS — H26491 Other secondary cataract, right eye: Secondary | ICD-10-CM

## 2023-12-13 NOTE — Progress Notes (Signed)
 This encounter was created in error - please disregard.

## 2023-12-14 ENCOUNTER — Encounter (INDEPENDENT_AMBULATORY_CARE_PROVIDER_SITE_OTHER): Payer: Self-pay | Admitting: Ophthalmology

## 2023-12-14 ENCOUNTER — Ambulatory Visit (INDEPENDENT_AMBULATORY_CARE_PROVIDER_SITE_OTHER): Admitting: Ophthalmology

## 2023-12-14 DIAGNOSIS — Z961 Presence of intraocular lens: Secondary | ICD-10-CM

## 2023-12-14 DIAGNOSIS — H35371 Puckering of macula, right eye: Secondary | ICD-10-CM

## 2023-12-14 NOTE — Progress Notes (Signed)
 [facility] - Clinic Note  12/14/2023   CHIEF COMPLAINT Patient presents for Retina Follow Up  HISTORY OF PRESENT ILLNESS: Jasmine Fox is a 63 y.o. female who presents to the clinic today for:  HPI     Retina Follow Up   Patient presents with  Other.  In right eye.  This started 2 weeks ago.  I, the attending physician,  performed the HPI with the patient and updated documentation appropriately.        Comments   Patient here for 2 weeks retina follow up for ERM OD. Patient states vision is ok. Very little eye pain. OD itches corner temporally.       Last edited by Valdemar Rogue, MD on 12/14/2023  8:24 AM.    Pt states her gas bubble has been gone since the 13th, her vision has improved since it disappeared  Referring physician: Willma Moats, OD [address] [place] [place],  [place] [address]  HISTORICAL INFORMATION:  Selected notes from the MEDICAL RECORD NUMBER Referred by Dr. Willma Moats for ERM / mac hole OD LEE:  Ocular Hx- PMH-   CURRENT MEDICATIONS: Current Outpatient Medications (Ophthalmic Drugs)  Medication Sig   bacitracin -polymyxin b  (POLYSPORIN ) ophthalmic ointment Place into the right eye 2 (two) times daily. Place a 1/2 inch ribbon of ointment into the lower eyelid.   prednisoLONE  acetate (PRED FORTE ) 1 % ophthalmic suspension Place 1 drop into the right eye 6 (six) times daily.   Propylene Glycol (SYSTANE COMPLETE) 0.6 % SOLN Place 1 spray into both eyes as needed (dry eyes).   No current facility-administered medications for this visit. (Ophthalmic Drugs)   Current Outpatient Medications (Other)  Medication Sig   oxymetazoline (AFRIN) 0.05 % nasal spray Place 1 spray into both nostrils 2 (two) times daily as needed for congestion.   No current facility-administered medications for this visit. (Other)   REVIEW OF SYSTEMS: ROS   Positive for: Eyes Negative for: Constitutional, Gastrointestinal, Neurological, Skin,  Genitourinary, Musculoskeletal, HENT, Endocrine, Cardiovascular, Respiratory, Psychiatric, Allergic/Imm, Heme/Lymph Last edited by Orval Asberry RAMAN, COA on 12/14/2023  7:55 AM.     ALLERGIES No Known Allergies  PAST MEDICAL HISTORY Past Medical History:  Diagnosis Date   Constipation    Migraine    none since 2013   Urolithiasis    Past Surgical History:  Procedure Laterality Date   BREAST BIOPSY Left 08/23/2008   DUCTAL PAPILLOMA WITH CALCIFICATIONS/ FIBROCYSTIC CHANGES AND CALCIFICATIONS   CATARACT EXTRACTION Left 07/27/2018   COLONOSCOPY N/A 11/10/2012   Procedure: COLONOSCOPY;  Surgeon: Lamar CHRISTELLA Hollingshead, MD;  [facility];  Service: Endoscopy;  Laterality: N/A;  11:15   COLONOSCOPY WITH PROPOFOL  N/A 03/09/2023   Procedure: COLONOSCOPY WITH PROPOFOL ;  Surgeon: Hollingshead Lamar CHRISTELLA, MD;  [facility];  Service: Endoscopy;  Laterality: N/A;  9:30 AM, ASA 2   ESOPHAGOGASTRODUODENOSCOPY  02/18/2002   MFM:Wnmfjo esophagus/s/p 54 French Maloney dilator   KIDNEY STONE SURGERY     lt bunion surgery     PARS PLANA VITRECTOMY W/REMOVE OF INTERNAL LIMITING MEMBRANE W/TAMPONADE Right 11/19/2023   Procedure: PARS PLANA VITRECTOMY WITH 25 GAUGE WITH REMOVAL OF INTERNAL LIMITING MEMBRANE OF RETINA WITH INTRAOCULAR TAMPONADE;  Surgeon: Valdemar Rogue, MD;  [facility] OR;  Service: Ophthalmology;  Laterality: Right;  C3F8 gas insertion   POLYPECTOMY  03/09/2023   Procedure: POLYPECTOMY INTESTINAL;  Surgeon: Hollingshead Lamar CHRISTELLA, MD;  [facility];  Service: Endoscopy;;  FAMILY HISTORY Family History  Problem Relation Age of Onset   Heart disease Mother        congestive heart failure   Diabetes Father    Hypertension Father    Colonic polyp Father    Cancer Brother        brain cancer   Hypertension Brother    Colon cancer Neg Hx    SOCIAL HISTORY Social History   Tobacco Use   Smoking status: Former   Smokeless tobacco: Never   Tobacco comments:    Only  about 2 packs cigarettes in lifetime  Vaping Use   Vaping status: Never Used  Substance Use Topics   Alcohol use: No    Alcohol/week: 0.0 standard drinks of alcohol   Drug use: No       OPHTHALMIC EXAM:  Base Eye Exam     Visual Acuity (Snellen - Linear)       Right Left   Dist cc 20/50 -2 20/20    Correction: Glasses         Tonometry (Tonopen, 7:51 AM)       Right Left   Pressure 15 16         Pupils       Dark Light Shape React APD   Right 4 4 Round Minimal None   Left 3 2 Round Brisk None         Visual Fields (Counting fingers)       Left Right    Full Full         Extraocular Movement       Right Left    Full, Ortho Full, Ortho         Neuro/Psych     Oriented x3: Yes   Mood/Affect: Normal         Dilation     Both eyes: 1.0% Mydriacyl , 2.5% Phenylephrine  @ 7:51 AM           Slit Lamp and Fundus Exam     Slit Lamp Exam       Right Left   Lids/Lashes Dermatochalasis - upper lid Dermatochalasis - upper lid   Conjunctiva/Sclera sutures intact White and quiet   Cornea trace PEE, well healed cataract wound trace PEE   Anterior Chamber deep and clear deep and clear   Iris Round and dilated Round and dilated   Lens PC IOL in good position, open PC PC IOL in good position with open PC   Anterior Vitreous post vitrectomy, gas bubble gone syneresis         Fundus Exam       Right Left   Disc mild Pallor, Sharp rim, mild temporal PPA Pink and Sharp, mild PPA   C/D Ratio 0.4 0.4   Macula ERM gone, central retinal thickening improved, rare MA Flat, Blunted foveal reflex, RPE mottling and clumping, fine drusen, No heme or edema   Vessels attenuated, mild tortuosity attenuated, mild tortuosity   Periphery Attached, mild reticular degeneration, No heme, good 360 laser changes peripherally Attached, No heme           Refraction     Wearing Rx       Sphere Cylinder Axis Add   Right [place] +0.25 100 +2.50   Left -2.50  +0.50 097 +2.50           IMAGING AND PROCEDURES  Imaging and Procedures for 12/14/2023  OCT, Retina - OU - Both Eyes       Right  Eye Quality was good. Central Foveal Thickness: 388. Progression has improved. Findings include no IRF, no SRF, abnormal foveal contour (ERM gone, central retinal thickening improved, mild inner retinal irregularity temporal mac).   Left Eye Quality was good. Central Foveal Thickness: 294. Progression has been stable. Findings include normal foveal contour, no IRF, no SRF, retinal drusen (Focal central druse, trace ERM).   Notes *Images captured and stored on drive  Diagnosis / Impression:  OD: ERM gone, central retinal thickening improved, mild inner retinal irregularity temporal mac OS: NFP; no IRF/SRF; Focal central druse, trace ERM  Clinical management:  See below  Abbreviations: NFP - Normal foveal profile. CME - cystoid macular edema. PED - pigment epithelial detachment. IRF - intraretinal fluid. SRF - subretinal fluid. EZ - ellipsoid zone. ERM - epiretinal membrane. ORA - outer retinal atrophy. ORT - outer retinal tubulation. SRHM - subretinal hyper-reflective material. IRHM - intraretinal hyper-reflective material           ASSESSMENT/PLAN:   ICD-10-CM   1. Epiretinal membrane (ERM) of right eye  H35.371 OCT, Retina - OU - Both Eyes    2. Pseudophakia, both eyes  Z96.1      Epiretinal membrane, right eye  - ERM with central cystic changes and central retinal thickening - pre-op BCVA 20/50 - +metamorphopsia - POW3 s/p PPV/TissueBlue /MP/20% SF6 OD, 05.29.25             - IOP good at 15             - ERM gone; gas bubble gone  - BCVA OD 20/50             - start PF taper -- 4,3,2,1 drops daily OD, decrease weekly PSO ung at bedtime / PRN OD              - post op drop and positioning instructions reviewed              - tylenol /ibuprofen for pain  - f/u 4 weeks, DFE, OCT  - letter to Dr. Nicholaus  2. Pseudophakia OU  - s/p CE/IOL  OU (Dr. Cleatus, 2020)  - IOLs in good position  - s/p Yag Cap OD (01.06.25) -- good PC opening - monitor   Ophthalmic Meds Ordered this visit:  No orders of the defined types were placed in this encounter.    Return in about 4 weeks (around 01/11/2024) for POV ERM OD, Dilated Exam, OCT.  There are no Patient Instructions on file for this visit.  Explained the diagnoses, plan, and follow up with the patient and they expressed understanding.  Patient expressed understanding of the importance of proper follow up care.   This document serves as a record of services personally performed by Redell JUDITHANN Hans, MD, PhD. It was created on their behalf by Alan PARAS. Delores, OA an ophthalmic technician. The creation of this record is the provider's dictation and/or activities during the visit.    Electronically signed by: Alan PARAS. Delores, OA 12/18/23 1:25 AM  Redell JUDITHANN Hans, M.D., Ph.D. Diseases & Surgery of the Retina and Vitreous [facility] 12/14/2023  I have reviewed the above documentation for accuracy and completeness, and I agree with the above. Redell JUDITHANN Hans, M.D., Ph.D. 12/18/23 1:27 AM   Abbreviations: M myopia (nearsighted); A astigmatism; H hyperopia (farsighted); P presbyopia; Mrx spectacle prescription;  CTL contact lenses; OD right eye; OS left eye; OU both eyes  XT exotropia; ET esotropia; PEK punctate  epithelial keratitis; PEE punctate epithelial erosions; DES dry eye syndrome; MGD meibomian gland dysfunction; ATs artificial tears; PFAT's preservative free artificial tears; NSC nuclear sclerotic cataract; PSC posterior subcapsular cataract; ERM epi-retinal membrane; PVD posterior vitreous detachment; RD retinal detachment; DM diabetes mellitus; DR diabetic retinopathy; NPDR non-proliferative diabetic retinopathy; PDR proliferative diabetic retinopathy; CSME clinically significant macular edema; DME diabetic macular edema; dbh dot blot hemorrhages; CWS cotton wool  spot; POAG primary open angle glaucoma; C/D cup-to-disc ratio; HVF humphrey visual field; GVF goldmann visual field; OCT optical coherence tomography; IOP intraocular pressure; BRVO Branch retinal vein occlusion; CRVO central retinal vein occlusion; CRAO central retinal artery occlusion; BRAO branch retinal artery occlusion; RT retinal tear; SB scleral buckle; PPV pars plana vitrectomy; VH Vitreous hemorrhage; PRP panretinal laser photocoagulation; IVK intravitreal kenalog ; VMT vitreomacular traction; MH Macular hole;  NVD neovascularization of the disc; NVE neovascularization elsewhere; AREDS age related eye disease study; ARMD age related macular degeneration; POAG primary open angle glaucoma; EBMD epithelial/anterior basement membrane dystrophy; ACIOL anterior chamber intraocular lens; IOL intraocular lens; PCIOL posterior chamber intraocular lens; Phaco/IOL phacoemulsification with intraocular lens placement; PRK photorefractive keratectomy; LASIK laser assisted in situ keratomileusis; HTN hypertension; DM diabetes mellitus; COPD chronic obstructive pulmonary disease

## 2023-12-29 NOTE — Progress Notes (Shared)
 [facility] - Clinic Note  01/11/2024   CHIEF COMPLAINT Patient presents for No chief complaint on file.  HISTORY OF PRESENT ILLNESS: Jasmine Fox is a 63 y.o. female who presents to the clinic today for:   Pt states her gas bubble has been gone since the 13th, her vision has improved since it disappeared  Referring physician: Willma Moats, OD [address] [place],  [place] [address]  HISTORICAL INFORMATION:  Selected notes from the MEDICAL RECORD NUMBER Referred by Dr. Willma Moats for ERM / mac hole OD LEE:  Ocular Hx- PMH-   CURRENT MEDICATIONS: Current Outpatient Medications (Ophthalmic Drugs)  Medication Sig   bacitracin -polymyxin b  (POLYSPORIN ) ophthalmic ointment Place into the right eye 2 (two) times daily. Place a 1/2 inch ribbon of ointment into the lower eyelid.   prednisoLONE  acetate (PRED FORTE ) 1 % ophthalmic suspension Place 1 drop into the right eye 6 (six) times daily.   Propylene Glycol (SYSTANE COMPLETE) 0.6 % SOLN Place 1 spray into both eyes as needed (dry eyes).   No current facility-administered medications for this visit. (Ophthalmic Drugs)   Current Outpatient Medications (Other)  Medication Sig   oxymetazoline (AFRIN) 0.05 % nasal spray Place 1 spray into both nostrils 2 (two) times daily as needed for congestion.   No current facility-administered medications for this visit. (Other)   REVIEW OF SYSTEMS:   ALLERGIES No Known Allergies  PAST MEDICAL HISTORY Past Medical History:  Diagnosis Date   Constipation    Migraine    none since 2013   Urolithiasis    Past Surgical History:  Procedure Laterality Date   BREAST BIOPSY Left 08/23/2008   DUCTAL PAPILLOMA WITH CALCIFICATIONS/ FIBROCYSTIC CHANGES AND CALCIFICATIONS   CATARACT EXTRACTION Left 07/27/2018   COLONOSCOPY N/A 11/10/2012   Procedure: COLONOSCOPY;  Surgeon: Lamar CHRISTELLA Hollingshead, MD;  [facility]: [facility];  Service: Endoscopy;  Laterality: N/A;   11:15   COLONOSCOPY WITH PROPOFOL  N/A 03/09/2023   Procedure: COLONOSCOPY WITH PROPOFOL ;  Surgeon: Hollingshead Lamar CHRISTELLA, MD;  Location: [facility];  Service: Endoscopy;  Laterality: N/A;  9:30 AM, ASA 2   ESOPHAGOGASTRODUODENOSCOPY  02/18/2002   MFM:Wnmfjo esophagus/s/p 54 French Maloney dilator   KIDNEY STONE SURGERY     lt bunion surgery     PARS PLANA VITRECTOMY W/REMOVE OF INTERNAL LIMITING MEMBRANE W/TAMPONADE Right 11/19/2023   Procedure: PARS PLANA VITRECTOMY WITH 25 GAUGE WITH REMOVAL OF INTERNAL LIMITING MEMBRANE OF RETINA WITH INTRAOCULAR TAMPONADE;  Surgeon: Valdemar Rogue, MD;  Location: [facility] OR;  Service: Ophthalmology;  Laterality: Right;  C3F8 gas insertion   POLYPECTOMY  03/09/2023   Procedure: POLYPECTOMY INTESTINAL;  Surgeon: Hollingshead Lamar CHRISTELLA, MD;  Location: [facility];  Service: Endoscopy;;   FAMILY HISTORY Family History  Problem Relation Age of Onset   Heart disease Mother        congestive heart failure   Diabetes Father    Hypertension Father    Colonic polyp Father    Cancer Brother        brain cancer   Hypertension Brother    Colon cancer Neg Hx    SOCIAL HISTORY Social History   Tobacco Use   Smoking status: Former   Smokeless tobacco: Never   Tobacco comments:    Only about 2 packs cigarettes in lifetime  Vaping Use   Vaping status: Never Used  Substance Use Topics   Alcohol use: No    Alcohol/week: 0.0 standard drinks  of alcohol   Drug use: No       OPHTHALMIC EXAM:  Not recorded    IMAGING AND PROCEDURES  Imaging and Procedures for 01/11/2024         ASSESSMENT/PLAN:   ICD-10-CM   1. Epiretinal membrane (ERM) of right eye  H35.371     2. Pseudophakia, both eyes  Z96.1     3. PCO (posterior capsular opacification), right  H26.491       Epiretinal membrane, right eye  - ERM with central cystic changes and central retinal thickening - pre-op BCVA 20/50 - +metamorphopsia - POW3 s/p PPV/TissueBlue /MP/20% SF6 OD, 05.29.25              - IOP good at 15             - ERM gone; gas bubble gone  - BCVA OD 20/50             - start PF taper -- 4,3,2,1 drops daily OD, decrease weekly PSO ung at bedtime / PRN OD              - post op drop and positioning instructions reviewed              - tylenol /ibuprofen for pain  - f/u 4 weeks, DFE, OCT  - letter to Dr. Nicholaus  2. Pseudophakia OU  - s/p CE/IOL OU (Dr. Cleatus, 2020)  - IOLs in good position  - s/p Yag Cap OD (01.06.25) -- good PC opening - monitor   Ophthalmic Meds Ordered this visit:  No orders of the defined types were placed in this encounter.    No follow-ups on file.  There are no Patient Instructions on file for this visit.  Explained the diagnoses, plan, and follow up with the patient and they expressed understanding.  Patient expressed understanding of the importance of proper follow up care.   This document serves as a record of services personally performed by Redell JUDITHANN Hans, MD, PhD. It was created on their behalf by Avelina Pereyra, COA an ophthalmic technician. The creation of this record is the provider's dictation and/or activities during the visit.   Electronically signed by: Avelina GORMAN Pereyra, COT  12/29/23  4:09 PM    Redell JUDITHANN Hans, M.D., Ph.D. Diseases & Surgery of the Retina and Vitreous [facility] 01/11/2024    Abbreviations: M myopia (nearsighted); A astigmatism; H hyperopia (farsighted); P presbyopia; Mrx spectacle prescription;  CTL contact lenses; OD right eye; OS left eye; OU both eyes  XT exotropia; ET esotropia; PEK punctate epithelial keratitis; PEE punctate epithelial erosions; DES dry eye syndrome; MGD meibomian gland dysfunction; ATs artificial tears; PFAT's preservative free artificial tears; NSC nuclear sclerotic cataract; PSC posterior subcapsular cataract; ERM epi-retinal membrane; PVD posterior vitreous detachment; RD retinal detachment; DM diabetes mellitus; DR diabetic retinopathy; NPDR  non-proliferative diabetic retinopathy; PDR proliferative diabetic retinopathy; CSME clinically significant macular edema; DME diabetic macular edema; dbh dot blot hemorrhages; CWS cotton wool spot; POAG primary open angle glaucoma; C/D cup-to-disc ratio; HVF humphrey visual field; GVF goldmann visual field; OCT optical coherence tomography; IOP intraocular pressure; BRVO Branch retinal vein occlusion; CRVO central retinal vein occlusion; CRAO central retinal artery occlusion; BRAO branch retinal artery occlusion; RT retinal tear; SB scleral buckle; PPV pars plana vitrectomy; VH Vitreous hemorrhage; PRP panretinal laser photocoagulation; IVK intravitreal kenalog ; VMT vitreomacular traction; MH Macular hole;  NVD neovascularization of the disc; NVE neovascularization elsewhere; AREDS age related eye disease  study; ARMD age related macular degeneration; POAG primary open angle glaucoma; EBMD epithelial/anterior basement membrane dystrophy; ACIOL anterior chamber intraocular lens; IOL intraocular lens; PCIOL posterior chamber intraocular lens; Phaco/IOL phacoemulsification with intraocular lens placement; PRK photorefractive keratectomy; LASIK laser assisted in situ keratomileusis; HTN hypertension; DM diabetes mellitus; COPD chronic obstructive pulmonary disease

## 2023-12-30 DIAGNOSIS — M9901 Segmental and somatic dysfunction of cervical region: Secondary | ICD-10-CM | POA: Diagnosis not present

## 2023-12-30 DIAGNOSIS — M5033 Other cervical disc degeneration, cervicothoracic region: Secondary | ICD-10-CM | POA: Diagnosis not present

## 2023-12-31 DIAGNOSIS — M9901 Segmental and somatic dysfunction of cervical region: Secondary | ICD-10-CM | POA: Diagnosis not present

## 2023-12-31 DIAGNOSIS — M5033 Other cervical disc degeneration, cervicothoracic region: Secondary | ICD-10-CM | POA: Diagnosis not present

## 2024-01-06 NOTE — Progress Notes (Addendum)
 [facility] - Clinic Note  01/14/2024   CHIEF COMPLAINT Patient presents for Retina Follow Up  HISTORY OF PRESENT ILLNESS: Jasmine Fox is a 63 y.o. female who presents to the clinic today for:  HPI     Retina Follow Up   Patient presents with  Other.  In right eye.  This started 4 weeks ago.  I, the attending physician,  performed the HPI with the patient and updated documentation appropriately.        Comments   Patient here for 4 weeks retina follow up for ERM OD. Patient states vision is ok. No eye pain.       Last edited by Valdemar Rogue, MD on 01/14/2024  8:50 AM.     Pt states she's doing well, read 3 books last week. Still seeing a bit of waviness when looking out at [place].   Referring physician: Willma Moats, OD [address] [place] [place],  [place] [address]  HISTORICAL INFORMATION:  Selected notes from the MEDICAL RECORD NUMBER Referred by Dr. Willma Moats for ERM / mac hole OD LEE:  Ocular Hx- PMH-   CURRENT MEDICATIONS: Current Outpatient Medications (Ophthalmic Drugs)  Medication Sig   bacitracin -polymyxin b  (POLYSPORIN ) ophthalmic ointment Place into the right eye 2 (two) times daily. Place a 1/2 inch ribbon of ointment into the lower eyelid.   prednisoLONE  acetate (PRED FORTE ) 1 % ophthalmic suspension Place 1 drop into the right eye 6 (six) times daily.   Propylene Glycol (SYSTANE COMPLETE) 0.6 % SOLN Place 1 spray into both eyes as needed (dry eyes).   No current facility-administered medications for this visit. (Ophthalmic Drugs)   Current Outpatient Medications (Other)  Medication Sig   oxymetazoline (AFRIN) 0.05 % nasal spray Place 1 spray into both nostrils 2 (two) times daily as needed for congestion.   No current facility-administered medications for this visit. (Other)   REVIEW OF SYSTEMS: ROS   Positive for: Eyes Negative for: Constitutional, Gastrointestinal, Neurological, Skin, Genitourinary, Musculoskeletal,  HENT, Endocrine, Cardiovascular, Respiratory, Psychiatric, Allergic/Imm, Heme/Lymph Last edited by Orval Asberry RAMAN, COA on 01/14/2024  8:06 AM.      ALLERGIES No Known Allergies  PAST MEDICAL HISTORY Past Medical History:  Diagnosis Date   Constipation    Migraine    none since 2013   Urolithiasis    Past Surgical History:  Procedure Laterality Date   BREAST BIOPSY Left 08/23/2008   DUCTAL PAPILLOMA WITH CALCIFICATIONS/ FIBROCYSTIC CHANGES AND CALCIFICATIONS   CATARACT EXTRACTION Left 07/27/2018   COLONOSCOPY N/A 11/10/2012   Procedure: COLONOSCOPY;  Surgeon: Lamar CHRISTELLA Hollingshead, MD;  [facility]: [facility];  Service: Endoscopy;  Laterality: N/A;  11:15   COLONOSCOPY WITH PROPOFOL  N/A 03/09/2023   Procedure: COLONOSCOPY WITH PROPOFOL ;  Surgeon: Hollingshead Lamar CHRISTELLA, MD;  [facility]: [facility];  Service: Endoscopy;  Laterality: N/A;  9:30 AM, ASA 2   ESOPHAGOGASTRODUODENOSCOPY  02/18/2002   MFM:Wnmfjo esophagus/s/p 54 French Maloney dilator   KIDNEY STONE SURGERY     lt bunion surgery     PARS PLANA VITRECTOMY W/REMOVE OF INTERNAL LIMITING MEMBRANE W/TAMPONADE Right 11/19/2023   Procedure: PARS PLANA VITRECTOMY WITH 25 GAUGE WITH REMOVAL OF INTERNAL LIMITING MEMBRANE OF RETINA WITH INTRAOCULAR TAMPONADE;  Surgeon: Valdemar Rogue, MD;  Location: [facility];  Service: Ophthalmology;  Laterality: Right;  C3F8 gas insertion   POLYPECTOMY  03/09/2023   Procedure: POLYPECTOMY INTESTINAL;  Surgeon: Hollingshead Lamar CHRISTELLA, MD;  [facility]: [facility];  Service:  Endoscopy;;   FAMILY HISTORY Family History  Problem Relation Age of Onset   Heart disease Mother        congestive heart failure   Diabetes Father    Hypertension Father    Colonic polyp Father    Cancer Brother        brain cancer   Hypertension Brother    Colon cancer Neg Hx    SOCIAL HISTORY Social History   Tobacco Use   Smoking status: Former   Smokeless tobacco: Never   Tobacco comments:    Only about 2 packs cigarettes in  lifetime  Vaping Use   Vaping status: Never Used  Substance Use Topics   Alcohol use: No    Alcohol/week: 0.0 standard drinks of alcohol   Drug use: No       OPHTHALMIC EXAM:  Base Eye Exam     Visual Acuity (Snellen - Linear)       Right Left   Dist cc 20/40 -1 20/20   Dist ph cc NI     Correction: Glasses         Tonometry (Tonopen, 8:04 AM)       Right Left   Pressure 16 14         Pupils       Dark Light Shape React APD   Right 4 4 Round Minimal None   Left 3 2 Round Brisk None         Visual Fields (Counting fingers)       Left Right    Full Full         Extraocular Movement       Right Left    Full, Ortho Full, Ortho         Neuro/Psych     Oriented x3: Yes   Mood/Affect: Normal         Dilation     Both eyes: 1.0% Mydriacyl , 2.5% Phenylephrine  @ 8:04 AM           Slit Lamp and Fundus Exam     Slit Lamp Exam       Right Left   Lids/Lashes Dermatochalasis - upper lid Dermatochalasis - upper lid   Conjunctiva/Sclera sutures intact White and quiet   Cornea trace PEE, well healed cataract wound trace PEE   Anterior Chamber deep and clear deep and clear   Iris Round and dilated Round and dilated   Lens PC IOL in good position, open PC PC IOL in good position with open PC   Anterior Vitreous post vitrectomy, clear syneresis         Fundus Exam       Right Left   Disc mild Pallor, Sharp rim, mild temporal PPA Pink and Sharp, mild PPA   C/D Ratio 0.4 0.4   Macula Good foveal reflex, ERM gone, central retinal thickening improved, no heme or edema Flat, Blunted foveal reflex, RPE mottling and clumping, fine drusen, No heme or edema   Vessels attenuated, mild tortuosity attenuated, mild tortuosity   Periphery Attached, mild reticular degeneration, No heme, good 360 laser changes peripherally Attached, No heme           Refraction     Wearing Rx       Sphere Cylinder Axis Add   Right [place] +0.25 100 +2.50   Left  -2.50 +0.50 097 +2.50           IMAGING AND PROCEDURES  Imaging and Procedures for 01/14/2024  OCT,  Retina - OU - Both Eyes       Right Eye Quality was good. Central Foveal Thickness: 386. Progression has improved. Findings include no IRF, no SRF, abnormal foveal contour (ERM gone, central retinal thickening-slightly improved, mild inner retinal irregularity temporal mac, mild interval improvement in ellipsoid signal. ).   Left Eye Quality was good. Central Foveal Thickness: 294. Progression has been stable. Findings include normal foveal contour, no IRF, no SRF, retinal drusen (Focal central druse, trace ERM).   Notes *Images captured and stored on drive  Diagnosis / Impression:  OD: ERM gone, central retinal thickening-slightly improved, mild inner retinal irregularity temporal mac, mild interval improvement in ellipsoid signal.  OS: NFP; no IRF/SRF; Focal central druse, trace ERM  Clinical management:  See below  Abbreviations: NFP - Normal foveal profile. CME - cystoid macular edema. PED - pigment epithelial detachment. IRF - intraretinal fluid. SRF - subretinal fluid. EZ - ellipsoid zone. ERM - epiretinal membrane. ORA - outer retinal atrophy. ORT - outer retinal tubulation. SRHM - subretinal hyper-reflective material. IRHM - intraretinal hyper-reflective material            ASSESSMENT/PLAN:   ICD-10-CM   1. Epiretinal membrane (ERM) of right eye  H35.371 OCT, Retina - OU - Both Eyes    2. Pseudophakia, both eyes  Z96.1     3. PCO (posterior capsular opacification), right  H26.491       Epiretinal membrane, right eye  - ERM with central cystic changes and central retinal thickening - pre-op BCVA 20/50 - +metamorphopsia - POW8 s/p PPV/TissueBlue /MP/20% SF6 OD, 05.29.25             - IOP good at 16             - ERM gone; gas bubble gone  - BCVA OD today is 20/40, improved from pre op VA of 20/50.             - completed PF taper last week -- now off all  drops  - f/u 3 months, DFE, OCT  - clear from a retina standpoint for new MRx  2. Pseudophakia OU  - s/p CE/IOL OU (Dr. Cleatus, 2020)  - IOLs in good position  - s/p Yag Cap OD (01.06.25) -- good PC opening - monitor   Ophthalmic Meds Ordered this visit:  No orders of the defined types were placed in this encounter.    Return in about 3 months (around 04/15/2024) for ERM OD, DFE, OCT.  There are no Patient Instructions on file for this visit.  Explained the diagnoses, plan, and follow up with the patient and they expressed understanding.  Patient expressed understanding of the importance of proper follow up care.   This document serves as a record of services personally performed by Redell JUDITHANN Hans, MD, PhD. It was created on their behalf by Alan PARAS. Delores, OA an ophthalmic technician. The creation of this record is the provider's dictation and/or activities during the visit.    Electronically signed by: Alan PARAS. Delores, OA 01/14/24 8:52 AM  This document serves as a record of services personally performed by Redell JUDITHANN Hans, MD, PhD. It was created on their behalf by Almetta Pesa, an ophthalmic technician. The creation of this record is the provider's dictation and/or activities during the visit.    Electronically signed by: Almetta Pesa, OA, 01/14/24  8:52 AM  Redell JUDITHANN Hans, M.D., Ph.D. Diseases & Surgery of the Retina and Vitreous [facility] 01/14/2024  I have reviewed the above documentation for accuracy and completeness, and I agree with the above. Redell JUDITHANN Hans, M.D., Ph.D. 01/14/24 8:52 AM   Abbreviations: M myopia (nearsighted); A astigmatism; H hyperopia (farsighted); P presbyopia; Mrx spectacle prescription;  CTL contact lenses; OD right eye; OS left eye; OU both eyes  XT exotropia; ET esotropia; PEK punctate epithelial keratitis; PEE punctate epithelial erosions; DES dry eye syndrome; MGD meibomian gland dysfunction; ATs artificial  tears; PFAT's preservative free artificial tears; NSC nuclear sclerotic cataract; PSC posterior subcapsular cataract; ERM epi-retinal membrane; PVD posterior vitreous detachment; RD retinal detachment; DM diabetes mellitus; DR diabetic retinopathy; NPDR non-proliferative diabetic retinopathy; PDR proliferative diabetic retinopathy; CSME clinically significant macular edema; DME diabetic macular edema; dbh dot blot hemorrhages; CWS cotton wool spot; POAG primary open angle glaucoma; C/D cup-to-disc ratio; HVF humphrey visual field; GVF goldmann visual field; OCT optical coherence tomography; IOP intraocular pressure; BRVO Branch retinal vein occlusion; CRVO central retinal vein occlusion; CRAO central retinal artery occlusion; BRAO branch retinal artery occlusion; RT retinal tear; SB scleral buckle; PPV pars plana vitrectomy; VH Vitreous hemorrhage; PRP panretinal laser photocoagulation; IVK intravitreal kenalog ; VMT vitreomacular traction; MH Macular hole;  NVD neovascularization of the disc; NVE neovascularization elsewhere; AREDS age related eye disease study; ARMD age related macular degeneration; POAG primary open angle glaucoma; EBMD epithelial/anterior basement membrane dystrophy; ACIOL anterior chamber intraocular lens; IOL intraocular lens; PCIOL posterior chamber intraocular lens; Phaco/IOL phacoemulsification with intraocular lens placement; PRK photorefractive keratectomy; LASIK laser assisted in situ keratomileusis; HTN hypertension; DM diabetes mellitus; COPD chronic obstructive pulmonary disease

## 2024-01-11 ENCOUNTER — Encounter (INDEPENDENT_AMBULATORY_CARE_PROVIDER_SITE_OTHER): Admitting: Ophthalmology

## 2024-01-11 DIAGNOSIS — Z961 Presence of intraocular lens: Secondary | ICD-10-CM

## 2024-01-11 DIAGNOSIS — H35371 Puckering of macula, right eye: Secondary | ICD-10-CM

## 2024-01-11 DIAGNOSIS — H26491 Other secondary cataract, right eye: Secondary | ICD-10-CM

## 2024-01-14 ENCOUNTER — Ambulatory Visit (INDEPENDENT_AMBULATORY_CARE_PROVIDER_SITE_OTHER): Admitting: Ophthalmology

## 2024-01-14 ENCOUNTER — Encounter (INDEPENDENT_AMBULATORY_CARE_PROVIDER_SITE_OTHER): Payer: Self-pay | Admitting: Ophthalmology

## 2024-01-14 DIAGNOSIS — H35371 Puckering of macula, right eye: Secondary | ICD-10-CM

## 2024-01-14 DIAGNOSIS — H26491 Other secondary cataract, right eye: Secondary | ICD-10-CM

## 2024-01-14 DIAGNOSIS — Z961 Presence of intraocular lens: Secondary | ICD-10-CM

## 2024-01-25 ENCOUNTER — Other Ambulatory Visit (HOSPITAL_BASED_OUTPATIENT_CLINIC_OR_DEPARTMENT_OTHER): Payer: Self-pay

## 2024-03-07 DIAGNOSIS — L820 Inflamed seborrheic keratosis: Secondary | ICD-10-CM | POA: Diagnosis not present

## 2024-03-07 DIAGNOSIS — L814 Other melanin hyperpigmentation: Secondary | ICD-10-CM | POA: Diagnosis not present

## 2024-03-07 DIAGNOSIS — X32XXXD Exposure to sunlight, subsequent encounter: Secondary | ICD-10-CM | POA: Diagnosis not present

## 2024-03-07 DIAGNOSIS — Z1283 Encounter for screening for malignant neoplasm of skin: Secondary | ICD-10-CM | POA: Diagnosis not present

## 2024-03-07 DIAGNOSIS — D225 Melanocytic nevi of trunk: Secondary | ICD-10-CM | POA: Diagnosis not present

## 2024-03-07 DIAGNOSIS — L570 Actinic keratosis: Secondary | ICD-10-CM | POA: Diagnosis not present

## 2024-03-30 DIAGNOSIS — Z23 Encounter for immunization: Secondary | ICD-10-CM | POA: Diagnosis not present

## 2024-03-30 DIAGNOSIS — Z1321 Encounter for screening for nutritional disorder: Secondary | ICD-10-CM | POA: Diagnosis not present

## 2024-03-30 DIAGNOSIS — D559 Anemia due to enzyme disorder, unspecified: Secondary | ICD-10-CM | POA: Diagnosis not present

## 2024-03-30 DIAGNOSIS — Z6820 Body mass index (BMI) 20.0-20.9, adult: Secondary | ICD-10-CM | POA: Diagnosis not present

## 2024-03-30 DIAGNOSIS — Z131 Encounter for screening for diabetes mellitus: Secondary | ICD-10-CM | POA: Diagnosis not present

## 2024-03-30 DIAGNOSIS — Z0001 Encounter for general adult medical examination with abnormal findings: Secondary | ICD-10-CM | POA: Diagnosis not present

## 2024-03-30 DIAGNOSIS — K5900 Constipation, unspecified: Secondary | ICD-10-CM | POA: Diagnosis not present

## 2024-04-12 ENCOUNTER — Other Ambulatory Visit (HOSPITAL_COMMUNITY)
Admission: RE | Admit: 2024-04-12 | Discharge: 2024-04-12 | Disposition: A | Discharge status: Home / Self Care | Source: Ambulatory Visit | Attending: Obstetrics & Gynecology | Admitting: Obstetrics & Gynecology

## 2024-04-12 ENCOUNTER — Ambulatory Visit (INDEPENDENT_AMBULATORY_CARE_PROVIDER_SITE_OTHER): Admitting: Obstetrics & Gynecology

## 2024-04-12 ENCOUNTER — Encounter: Payer: Self-pay | Admitting: Obstetrics & Gynecology

## 2024-04-12 VITALS — BP 124/74 | HR 65 | Ht 64.0 in | Wt 118.0 lb

## 2024-04-12 DIAGNOSIS — Z1331 Encounter for screening for depression: Secondary | ICD-10-CM

## 2024-04-12 DIAGNOSIS — Z01419 Encounter for gynecological examination (general) (routine) without abnormal findings: Secondary | ICD-10-CM | POA: Diagnosis not present

## 2024-04-12 DIAGNOSIS — Z1151 Encounter for screening for human papillomavirus (HPV): Secondary | ICD-10-CM | POA: Diagnosis not present

## 2024-04-12 DIAGNOSIS — L900 Lichen sclerosus et atrophicus: Secondary | ICD-10-CM | POA: Diagnosis not present

## 2024-04-12 NOTE — Progress Notes (Signed)
 Subjective:     Jasmine Fox is a 63 y.o. female here for a routine exam.  No LMP recorded. Patient is postmenopausal. G1P1 Birth Control Method:  menospausal Menstrual Calendar(currently): amenorrhea  Current complaints: vulvar irritation  Current acute medical issues:  none   Recent Gynecologic History No LMP recorded. Patient is postmenopausal. Last Pap: 2020,  normal Last mammogram: 12/24,  normal  Past Medical History:  Diagnosis Date   Constipation    Migraine    none since 2013   Urolithiasis     Past Surgical History:  Procedure Laterality Date   BREAST BIOPSY Left 08/23/2008   DUCTAL PAPILLOMA WITH CALCIFICATIONS/ FIBROCYSTIC CHANGES AND CALCIFICATIONS   CATARACT EXTRACTION Left 07/27/2018   COLONOSCOPY N/A 11/10/2012   Procedure: COLONOSCOPY;  Surgeon: Lamar CHRISTELLA Hollingshead, MD;  [facility];  Service: Endoscopy;  Laterality: N/A;  11:15   COLONOSCOPY WITH PROPOFOL  N/A 03/09/2023   Procedure: COLONOSCOPY WITH PROPOFOL ;  Surgeon: Hollingshead Lamar CHRISTELLA, MD;  [facility];  Service: Endoscopy;  Laterality: N/A;  9:30 AM, ASA 2   ESOPHAGOGASTRODUODENOSCOPY  02/18/2002   MFM:Wnmfjo esophagus/s/p 54 French Maloney dilator   KIDNEY STONE SURGERY     lt bunion surgery     PARS PLANA VITRECTOMY W/REMOVE OF INTERNAL LIMITING MEMBRANE W/TAMPONADE Right 11/19/2023   Procedure: PARS PLANA VITRECTOMY WITH 25 GAUGE WITH REMOVAL OF INTERNAL LIMITING MEMBRANE OF RETINA WITH INTRAOCULAR TAMPONADE;  Surgeon: Valdemar Rogue, MD;  Location: [facility] OR;  Service: Ophthalmology;  Laterality: Right;  C3F8 gas insertion   POLYPECTOMY  03/09/2023   Procedure: POLYPECTOMY INTESTINAL;  Surgeon: Hollingshead Lamar CHRISTELLA, MD;  [facility];  Service: Endoscopy;;    OB History     Gravida  1   Para  1   Term      Preterm      AB      Living         SAB      IAB      Ectopic      Multiple      Live Births              Social History   Socioeconomic History    Marital status: Married    Spouse name: Not on file   Number of children: 0   Years of education: Not on file   Highest education level: Not on file  Occupational History    Employer: [employer]  Tobacco Use   Smoking status: Former   Smokeless tobacco: Never   Tobacco comments:    Only about 2 packs cigarettes in lifetime  Vaping Use   Vaping status: Never Used  Substance and Sexual Activity   Alcohol use: No    Alcohol/week: 0.0 standard drinks of alcohol   Drug use: No   Sexual activity: Not Currently    Birth control/protection: Post-menopausal  Other Topics Concern   Not on file  Social History Narrative   Not on file   Social Drivers of Health   Financial Resource Strain: Low Risk  (04/12/2024)   Overall Financial Resource Strain (CARDIA)    Difficulty of Paying Living Expenses: Not hard at all  Food Insecurity: No Food Insecurity (04/12/2024)   Hunger Vital Sign    Worried About Running Out of Food in the Last Year: Never true    Ran Out of Food in the Last Year: Never true  Transportation Needs: No Transportation Needs (04/12/2024)   PRAPARE - Transportation  Lack of Transportation (Medical): No    Lack of Transportation (Non-Medical): No  Physical Activity: Sufficiently Active (04/12/2024)   Exercise Vital Sign    Days of Exercise per Week: 4 days    Minutes of Exercise per Session: 60 min  Stress: No Stress Concern Present (04/12/2024)   [employer] of Occupational Health - Occupational Stress Questionnaire    Feeling of Stress: Not at all  Social Connections: Moderately Isolated (04/12/2024)   Social Connection and Isolation Panel    Frequency of Communication with Friends and Family: More than three times a week    Frequency of Social Gatherings with Friends and Family: Once a week    Attends Religious Services: Never    Database Administrator or Organizations: No    Attends Engineer, Structural: Never    Marital Status: Married    Family  History  Problem Relation Age of Onset   Heart disease Mother        congestive heart failure   Diabetes Father    Hypertension Father    Colonic polyp Father    Cancer Brother        brain cancer   Hypertension Brother    Colon cancer Neg Hx      Current Outpatient Medications:    Propylene Glycol (SYSTANE COMPLETE) 0.6 % SOLN, Place 1 spray into both eyes as needed (dry eyes)., Disp: , Rfl:    bacitracin -polymyxin b  (POLYSPORIN ) ophthalmic ointment, Place into the right eye 2 (two) times daily. Place a 1/2 inch ribbon of ointment into the lower eyelid. (Patient not taking: Reported on 04/15/2024), Disp: 3.5 g, Rfl: 5   oxymetazoline (AFRIN) 0.05 % nasal spray, Place 1 spray into both nostrils 2 (two) times daily as needed for congestion. (Patient not taking: Reported on 04/15/2024), Disp: , Rfl:    prednisoLONE  acetate (PRED FORTE ) 1 % ophthalmic suspension, Place 1 drop into the right eye 6 (six) times daily. (Patient not taking: Reported on 04/15/2024), Disp: 15 mL, Rfl: 0  Review of Systems  Review of Systems  Constitutional: Negative for fever, chills, weight loss, malaise/fatigue and diaphoresis.  HENT: Negative for hearing loss, ear pain, nosebleeds, congestion, sore throat, neck pain, tinnitus and ear discharge.   Eyes: Negative for blurred vision, double vision, photophobia, pain, discharge and redness.  Respiratory: Negative for cough, hemoptysis, sputum production, shortness of breath, wheezing and stridor.   Cardiovascular: Negative for chest pain, palpitations, orthopnea, claudication, leg swelling and PND.  Gastrointestinal: negative for abdominal pain. Negative for heartburn, nausea, vomiting, diarrhea, constipation, blood in stool and melena.  Genitourinary: Negative for dysuria, urgency, frequency, hematuria and flank pain.  Musculoskeletal: Negative for myalgias, back pain, joint pain and falls.  Skin: Negative for itching and rash.  Neurological: Negative for  dizziness, tingling, tremors, sensory change, speech change, focal weakness, seizures, loss of consciousness, weakness and headaches.  Endo/Heme/Allergies: Negative for environmental allergies and polydipsia. Does not bruise/bleed easily.  Psychiatric/Behavioral: Negative for depression, suicidal ideas, hallucinations, memory loss and substance abuse. The patient is not nervous/anxious and does not have insomnia.        Objective:  Blood pressure 124/74, pulse 65, height 5' 4 (1.626 m), weight 118 lb (53.5 kg).   Physical Exam  Vitals reviewed. Constitutional: She is oriented to person, place, and time. She appears well-developed and well-nourished.  HENT:  Head: Normocephalic and atraumatic.        Right Ear: External ear normal.  Left Ear: External ear normal.  Nose: Nose normal.  Mouth/Throat: Oropharynx is clear and moist.  Eyes: Conjunctivae and EOM are normal. Pupils are equal, round, and reactive to light. Right eye exhibits no discharge. Left eye exhibits no discharge. No scleral icterus.  Neck: Normal range of motion. Neck supple. No tracheal deviation present. No thyromegaly present.  Cardiovascular: Normal rate, regular rhythm, normal heart sounds and intact distal pulses.  Exam reveals no gallop and no friction rub.   No murmur heard. Respiratory: Effort normal and breath sounds normal. No respiratory distress. She has no wheezes. She has no rales. She exhibits no tenderness.  GI: Soft. Bowel sounds are normal. She exhibits no distension and no mass. There is no tenderness. There is no rebound and no guarding.  Genitourinary:  Breasts no masses skin changes or nipple changes bilaterally      Vulva is thick white plaque, probably LSA, but requires definitive biopsy Vagina is pink moist without discharge Cervix normal in appearance and pap is done Uterus is normal size shape and contour Adnexa is negative with normal sized ovaries   Musculoskeletal: Normal range of motion.  She exhibits no edema and no tenderness.  Neurological: She is alert and oriented to person, place, and time. She has normal reflexes. She displays normal reflexes. No cranial nerve deficit. She exhibits normal muscle tone. Coordination normal.  Skin: Skin is warm and dry. No rash noted. No erythema. No pallor.  Psychiatric: She has a normal mood and affect. Her behavior is normal. Judgment and thought content normal.       Medications Ordered at today's visit: No orders of the defined types were placed in this encounter.   Other orders placed at today's visit: No orders of the defined types were placed in this encounter.    ASSESSMENT + PLAN:    ICD-10-CM   1. Well woman exam with routine gynecological exam  Z01.419     2. Encounter for gynecological examination with Papanicolaou smear of cervix  Z01.419 Cytology - PAP( [place])    3. Lichen sclerosus et atrophicus: Recommend punch biopsy  L90.0           Return in about 3 weeks (around 05/03/2024) for punch biopsy , with Dr Jayne.

## 2024-04-13 NOTE — Progress Notes (Signed)
 [facility] - Clinic Note  04/15/2024   CHIEF COMPLAINT Patient presents for Retina Follow Up  HISTORY OF PRESENT ILLNESS: Jasmine Fox is a 63 y.o. female who presents to the clinic today for:  HPI     Retina Follow Up   Patient presents with  Other.  In right eye.  This started 3 months ago.  I, the attending physician,  performed the HPI with the patient and updated documentation appropriately.        Comments   Patient here for 3 months retina follow up for ERM OD. Patient states vision is ok. OS lights are bothersome. Especially at night. Has dry eyes uses systane 1 plus times a day. OD has pain on occasion. Doesn't last long.      Last edited by Valdemar Rogue, MD on 04/21/2024  2:20 PM.     Patient states lights are bothering her left eye. She feels the vision is stable.   Referring physician: Willma Moats, OD [address] [place] [place],  [place] [address]  HISTORICAL INFORMATION:  Selected notes from the MEDICAL RECORD NUMBER Referred by Dr. Willma Moats for ERM / mac hole OD LEE:  Ocular Hx- PMH-   CURRENT MEDICATIONS: Current Outpatient Medications (Ophthalmic Drugs)  Medication Sig   Propylene Glycol (SYSTANE COMPLETE) 0.6 % SOLN Place 1 spray into both eyes as needed (dry eyes).   bacitracin -polymyxin b  (POLYSPORIN ) ophthalmic ointment Place into the right eye 2 (two) times daily. Place a 1/2 inch ribbon of ointment into the lower eyelid. (Patient not taking: Reported on 04/15/2024)   prednisoLONE  acetate (PRED FORTE ) 1 % ophthalmic suspension Place 1 drop into the right eye 6 (six) times daily. (Patient not taking: Reported on 04/15/2024)   No current facility-administered medications for this visit. (Ophthalmic Drugs)   Current Outpatient Medications (Other)  Medication Sig   oxymetazoline (AFRIN) 0.05 % nasal spray Place 1 spray into both nostrils 2 (two) times daily as needed for congestion. (Patient not taking: Reported on  04/15/2024)   No current facility-administered medications for this visit. (Other)   REVIEW OF SYSTEMS: ROS   Positive for: Eyes Negative for: Constitutional, Gastrointestinal, Neurological, Skin, Genitourinary, Musculoskeletal, HENT, Endocrine, Cardiovascular, Respiratory, Psychiatric, Allergic/Imm, Heme/Lymph Last edited by Orval Asberry RAMAN, COA on 04/15/2024  8:12 AM.       ALLERGIES No Known Allergies  PAST MEDICAL HISTORY Past Medical History:  Diagnosis Date   Constipation    Migraine    none since 2013   Urolithiasis    Past Surgical History:  Procedure Laterality Date   BREAST BIOPSY Left 08/23/2008   DUCTAL PAPILLOMA WITH CALCIFICATIONS/ FIBROCYSTIC CHANGES AND CALCIFICATIONS   CATARACT EXTRACTION Left 07/27/2018   COLONOSCOPY N/A 11/10/2012   Procedure: COLONOSCOPY;  Surgeon: Lamar CHRISTELLA Hollingshead, MD;  [facility];  Service: Endoscopy;  Laterality: N/A;  11:15   COLONOSCOPY WITH PROPOFOL  N/A 03/09/2023   Procedure: COLONOSCOPY WITH PROPOFOL ;  Surgeon: Hollingshead Lamar CHRISTELLA, MD;  [facility];  Service: Endoscopy;  Laterality: N/A;  9:30 AM, ASA 2   ESOPHAGOGASTRODUODENOSCOPY  02/18/2002   MFM:Wnmfjo esophagus/s/p 54 French Maloney dilator   KIDNEY STONE SURGERY     lt bunion surgery     PARS PLANA VITRECTOMY W/REMOVE OF INTERNAL LIMITING MEMBRANE W/TAMPONADE Right 11/19/2023   Procedure: PARS PLANA VITRECTOMY WITH 25 GAUGE WITH REMOVAL OF INTERNAL LIMITING MEMBRANE OF RETINA WITH INTRAOCULAR TAMPONADE;  Surgeon: Valdemar Rogue, MD;  Location: [facility];  Service: Ophthalmology;  Laterality: Right;  C3F8 gas insertion   POLYPECTOMY  03/09/2023   Procedure: POLYPECTOMY INTESTINAL;  Surgeon: Shaaron Lamar HERO, MD;  [facility];  Service: Endoscopy;;   FAMILY HISTORY Family History  Problem Relation Age of Onset   Heart disease Mother        congestive heart failure   Diabetes Father    Hypertension Father    Colonic polyp Father    Cancer  Brother        brain cancer   Hypertension Brother    Colon cancer Neg Hx    SOCIAL HISTORY Social History   Tobacco Use   Smoking status: Former   Smokeless tobacco: Never   Tobacco comments:    Only about 2 packs cigarettes in lifetime  Vaping Use   Vaping status: Never Used  Substance Use Topics   Alcohol use: No    Alcohol/week: 0.0 standard drinks of alcohol   Drug use: No       OPHTHALMIC EXAM:  Base Eye Exam     Visual Acuity (Snellen - Linear)       Right Left   Dist cc 20/30 20/20   Dist ph cc NI     Correction: Glasses         Tonometry (Tonopen, 8:09 AM)       Right Left   Pressure 17 12         Pupils       Dark Light Shape React APD   Right 3 2 Round Brisk None   Left 3 2 Round Brisk None         Visual Fields (Counting fingers)       Left Right    Full Full         Extraocular Movement       Right Left    Full, Ortho Full, Ortho         Neuro/Psych     Oriented x3: Yes   Mood/Affect: Normal         Dilation     Both eyes: 1.0% Mydriacyl , 2.5% Phenylephrine  @ 8:09 AM           Slit Lamp and Fundus Exam     Slit Lamp Exam       Right Left   Lids/Lashes Dermatochalasis - upper lid Dermatochalasis - upper lid   Conjunctiva/Sclera sutures intact White and quiet   Cornea trace PEE, well healed cataract wound 1+ Punctate epithelial erosions   Anterior Chamber deep and clear deep and clear   Iris Round and dilated Round and dilated   Lens PC IOL in good position, open PC PC IOL in good position with open PC   Anterior Vitreous post vitrectomy, clear syneresis         Fundus Exam       Right Left   Disc mild Pallor, Sharp rim, mild temporal PPA Pink and Sharp, mild PPA   C/D Ratio 0.4 0.4   Macula Good foveal reflex, ERM gone, central retinal thickening improved, no heme or edema Flat, good foveal reflex, RPE mottling and clumping, fine drusen, No heme or edema   Vessels attenuated, mild tortuosity  attenuated, mild tortuosity   Periphery Attached, mild reticular degeneration, No heme, good 360 laser changes peripherally Attached, mild Reticular degeneration, No heme           Refraction     Wearing Rx       Sphere Cylinder Axis  Add   Right [place] +0.25 100 +2.50   Left -2.50 +0.50 097 +2.50           IMAGING AND PROCEDURES  Imaging and Procedures for 04/15/2024  OCT, Retina - OU - Both Eyes       Right Eye Quality was good. Central Foveal Thickness: 376. Progression has improved. Findings include no IRF, no SRF, abnormal foveal contour (ERM gone, central retinal thickening-slightly improved, mild inner retinal irregularity temporal mac, mild interval improvement in ellipsoid signal. ).   Left Eye Quality was good. Central Foveal Thickness: 294. Progression has been stable. Findings include normal foveal contour, no IRF, no SRF, retinal drusen (Focal central druse, trace ERM).   Notes *Images captured and stored on drive  Diagnosis / Impression:  OD: ERM gone, central retinal thickening-slightly improved, mild inner retinal irregularity temporal mac, mild interval improvement in ellipsoid signal.  OS: NFP; no IRF/SRF; Focal central druse, trace ERM  Clinical management:  See below  Abbreviations: NFP - Normal foveal profile. CME - cystoid macular edema. PED - pigment epithelial detachment. IRF - intraretinal fluid. SRF - subretinal fluid. EZ - ellipsoid zone. ERM - epiretinal membrane. ORA - outer retinal atrophy. ORT - outer retinal tubulation. SRHM - subretinal hyper-reflective material. IRHM - intraretinal hyper-reflective material           ASSESSMENT/PLAN:   ICD-10-CM   1. Epiretinal membrane (ERM) of right eye  H35.371 OCT, Retina - OU - Both Eyes    2. Pseudophakia, both eyes  Z96.1      Epiretinal membrane, right eye  - ERM with central cystic changes and central retinal thickening - pre-op BCVA 20/50 - +metamorphopsia - s/p  PPV/TissueBlue /MP/20% SF6 OD, 05.29.25             - IOP good at 16             - ERM gone - BCVA OD today is 20/30 from 20/40, improved from pre op VA of 20/50.             -  now off all drops  - f/u 6 months, DFE, OCT  - clear from a retina standpoint for new MRx  2. Pseudophakia OU  - s/p CE/IOL OU (Dr. Cleatus, 2020)  - IOLs in good position  - s/p Yag Cap OD (01.06.25) -- good PC opening - monitor   Ophthalmic Meds Ordered this visit:  No orders of the defined types were placed in this encounter.    Return in about 6 months (around 10/14/2024) for f/u ERM, DFE, OCT.  There are no Patient Instructions on file for this visit.  This document serves as a record of services personally performed by Redell JUDITHANN Hans, MD, PhD. It was created on their behalf by Delon Newness COT, an ophthalmic technician. The creation of this record is the provider's dictation and/or activities during the visit.    Electronically signed by: Delon Newness COT 10.22.25  2:29 PM  This document serves as a record of services personally performed by Redell JUDITHANN Hans, MD, PhD. It was created on their behalf by Wanda GEANNIE Keens, COT an ophthalmic technician. The creation of this record is the provider's dictation and/or activities during the visit.    Electronically signed by:  Wanda GEANNIE Keens, COT  04/21/24 2:29 PM  Redell JUDITHANN Hans, M.D., Ph.D. Diseases & Surgery of the Retina and Vitreous [facility] 04/15/2024   I have reviewed the above documentation for  accuracy and completeness, and I agree with the above. Redell JUDITHANN Hans, M.D., Ph.D. 04/21/24 2:30 PM   Abbreviations: M myopia (nearsighted); A astigmatism; H hyperopia (farsighted); P presbyopia; Mrx spectacle prescription;  CTL contact lenses; OD right eye; OS left eye; OU both eyes  XT exotropia; ET esotropia; PEK punctate epithelial keratitis; PEE punctate epithelial erosions; DES dry eye syndrome; MGD  meibomian gland dysfunction; ATs artificial tears; PFAT's preservative free artificial tears; NSC nuclear sclerotic cataract; PSC posterior subcapsular cataract; ERM epi-retinal membrane; PVD posterior vitreous detachment; RD retinal detachment; DM diabetes mellitus; DR diabetic retinopathy; NPDR non-proliferative diabetic retinopathy; PDR proliferative diabetic retinopathy; CSME clinically significant macular edema; DME diabetic macular edema; dbh dot blot hemorrhages; CWS cotton wool spot; POAG primary open angle glaucoma; C/D cup-to-disc ratio; HVF humphrey visual field; GVF goldmann visual field; OCT optical coherence tomography; IOP intraocular pressure; BRVO Branch retinal vein occlusion; CRVO central retinal vein occlusion; CRAO central retinal artery occlusion; BRAO branch retinal artery occlusion; RT retinal tear; SB scleral buckle; PPV pars plana vitrectomy; VH Vitreous hemorrhage; PRP panretinal laser photocoagulation; IVK intravitreal kenalog ; VMT vitreomacular traction; MH Macular hole;  NVD neovascularization of the disc; NVE neovascularization elsewhere; AREDS age related eye disease study; ARMD age related macular degeneration; POAG primary open angle glaucoma; EBMD epithelial/anterior basement membrane dystrophy; ACIOL anterior chamber intraocular lens; IOL intraocular lens; PCIOL posterior chamber intraocular lens; Phaco/IOL phacoemulsification with intraocular lens placement; PRK photorefractive keratectomy; LASIK laser assisted in situ keratomileusis; HTN hypertension; DM diabetes mellitus; COPD chronic obstructive pulmonary disease

## 2024-04-14 ENCOUNTER — Encounter (INDEPENDENT_AMBULATORY_CARE_PROVIDER_SITE_OTHER): Admitting: Ophthalmology

## 2024-04-15 ENCOUNTER — Encounter (INDEPENDENT_AMBULATORY_CARE_PROVIDER_SITE_OTHER): Payer: Self-pay | Admitting: Ophthalmology

## 2024-04-15 ENCOUNTER — Ambulatory Visit (INDEPENDENT_AMBULATORY_CARE_PROVIDER_SITE_OTHER): Admitting: Ophthalmology

## 2024-04-15 DIAGNOSIS — H35371 Puckering of macula, right eye: Secondary | ICD-10-CM | POA: Diagnosis not present

## 2024-04-15 DIAGNOSIS — Z961 Presence of intraocular lens: Secondary | ICD-10-CM

## 2024-04-15 LAB — CYTOLOGY - PAP
Comment: NEGATIVE
Diagnosis: NEGATIVE
High risk HPV: NEGATIVE

## 2024-04-21 ENCOUNTER — Encounter (INDEPENDENT_AMBULATORY_CARE_PROVIDER_SITE_OTHER): Payer: Self-pay | Admitting: Ophthalmology

## 2024-05-03 ENCOUNTER — Other Ambulatory Visit (HOSPITAL_COMMUNITY)
Admission: RE | Admit: 2024-05-03 | Discharge: 2024-05-03 | Disposition: A | Discharge status: Home / Self Care | Source: Ambulatory Visit | Attending: Obstetrics & Gynecology | Admitting: Obstetrics & Gynecology

## 2024-05-03 ENCOUNTER — Ambulatory Visit (INDEPENDENT_AMBULATORY_CARE_PROVIDER_SITE_OTHER): Admitting: Obstetrics & Gynecology

## 2024-05-03 VITALS — BP 101/67 | HR 70 | Ht 64.0 in | Wt 118.0 lb

## 2024-05-03 DIAGNOSIS — N904 Leukoplakia of vulva: Secondary | ICD-10-CM | POA: Insufficient documentation

## 2024-05-03 DIAGNOSIS — D071 Carcinoma in situ of vulva: Secondary | ICD-10-CM | POA: Diagnosis not present

## 2024-05-03 NOTE — Progress Notes (Signed)
 Patient ID: Jasmine Fox, female   DOB: [birth year], 63 y.o.   MRN: [id number]  Chief Complaint  Patient presents with   Procedure    Punch bx.    HPI Jasmine Fox is a 63 y.o. female.   HPI Indication: white lesion of the vulva Symptoms:   pruritic and not painful  Location:  perineum  Past Medical History:  Diagnosis Date   Constipation    Migraine    none since 2013   Urolithiasis     Past Surgical History:  Procedure Laterality Date   BREAST BIOPSY Left 08/23/2008   DUCTAL PAPILLOMA WITH CALCIFICATIONS/ FIBROCYSTIC CHANGES AND CALCIFICATIONS   CATARACT EXTRACTION Left 07/27/2018   COLONOSCOPY N/A 11/10/2012   Procedure: COLONOSCOPY;  Surgeon: Lamar CHRISTELLA Hollingshead, MD;  [facility];  Service: Endoscopy;  Laterality: N/A;  11:15   COLONOSCOPY WITH PROPOFOL  N/A 03/09/2023   Procedure: COLONOSCOPY WITH PROPOFOL ;  Surgeon: Hollingshead Lamar CHRISTELLA, MD;  [facility];  Service: Endoscopy;  Laterality: N/A;  9:30 AM, ASA 2   ESOPHAGOGASTRODUODENOSCOPY  02/18/2002   MFM:Wnmfjo esophagus/s/p 54 French Maloney dilator   KIDNEY STONE SURGERY     lt bunion surgery     PARS PLANA VITRECTOMY W/REMOVE OF INTERNAL LIMITING MEMBRANE W/TAMPONADE Right 11/19/2023   Procedure: PARS PLANA VITRECTOMY WITH 25 GAUGE WITH REMOVAL OF INTERNAL LIMITING MEMBRANE OF RETINA WITH INTRAOCULAR TAMPONADE;  Surgeon: Valdemar Rogue, MD;  Location[facility];  Service: Ophthalmology;  Laterality: Right;  C3F8 gas insertion   POLYPECTOMY  03/09/2023   Procedure: POLYPECTOMY INTESTINAL;  Surgeon: Hollingshead Lamar CHRISTELLA, MD;  [facility];  Service: Endoscopy;;    Family History  Problem Relation Age of Onset   Heart disease Mother        congestive heart failure   Diabetes Father    Hypertension Father    Colonic polyp Father    Cancer Brother        brain cancer   Hypertension Brother    Colon cancer Neg Hx     Social History Social History   Tobacco Use   Smoking status: Former    Smokeless tobacco: Never   Tobacco comments:    Only about 2 packs cigarettes in lifetime  Vaping Use   Vaping status: Never Used  Substance Use Topics   Alcohol use: No    Alcohol/week: 0.0 standard drinks of alcohol   Drug use: No    No Known Allergies  Current Outpatient Medications  Medication Sig Dispense Refill   bacitracin -polymyxin b  (POLYSPORIN ) ophthalmic ointment Place into the right eye 2 (two) times daily. Place a 1/2 inch ribbon of ointment into the lower eyelid. (Patient not taking: Reported on 04/15/2024) 3.5 g 5   oxymetazoline (AFRIN) 0.05 % nasal spray Place 1 spray into both nostrils 2 (two) times daily as needed for congestion. (Patient not taking: Reported on 04/15/2024)     prednisoLONE  acetate (PRED FORTE ) 1 % ophthalmic suspension Place 1 drop into the right eye 6 (six) times daily. (Patient not taking: Reported on 04/15/2024) 15 mL 0   Propylene Glycol (SYSTANE COMPLETE) 0.6 % SOLN Place 1 spray into both eyes as needed (dry eyes).     No current facility-administered medications for this visit.    Review of Systems Review of Systems  Blood pressure 101/67, pulse 70, height 5' 4 (1.626 m), weight 118 lb (53.5 kg).  Physical Exam Physical Exam  Data Reviewed   Assessment  Prepping with Betadine Local anesthesia with 1% Buffered Lidocaine  4  mm punch biopsy performed per protocol Figure of 8 suture x 2 3-0 chromic Well tolerated  Specimen appropriately identified and sent to pathology    Plan    Follow-up:  prn weeks      [name] 05/03/2024, 4:08 PM

## 2024-05-04 ENCOUNTER — Other Ambulatory Visit (HOSPITAL_COMMUNITY): Payer: Self-pay | Admitting: Family Medicine

## 2024-05-04 DIAGNOSIS — Z1231 Encounter for screening mammogram for malignant neoplasm of breast: Secondary | ICD-10-CM

## 2024-05-11 ENCOUNTER — Ambulatory Visit (HOSPITAL_COMMUNITY): Payer: Self-pay | Admitting: Obstetrics & Gynecology

## 2024-05-11 LAB — SURGICAL PATHOLOGY

## 2024-05-13 ENCOUNTER — Encounter: Payer: Self-pay | Admitting: Psychiatry

## 2024-05-13 ENCOUNTER — Telehealth: Payer: Self-pay

## 2024-05-13 NOTE — Telephone Encounter (Signed)
 Spoke with Ms.Jolee regarding her referral to GYN oncology. She has an appointment scheduled with Dr. Eldonna on 12/1 at 9:45. Patient agrees to date and time. She has been provided with office address and location. She is also aware of our mask and visitor policy. Patient verbalized understanding and will call with any questions.

## 2024-05-23 ENCOUNTER — Inpatient Hospital Stay: Admitting: Gynecologic Oncology

## 2024-05-23 ENCOUNTER — Encounter: Payer: Self-pay | Admitting: Psychiatry

## 2024-05-23 ENCOUNTER — Inpatient Hospital Stay: Attending: Psychiatry | Admitting: Psychiatry

## 2024-05-23 VITALS — BP 118/63 | HR 74 | Temp 98.5°F | Resp 18 | Ht 64.0 in | Wt 115.6 lb

## 2024-05-23 DIAGNOSIS — Z87891 Personal history of nicotine dependence: Secondary | ICD-10-CM | POA: Diagnosis not present

## 2024-05-23 DIAGNOSIS — D071 Carcinoma in situ of vulva: Secondary | ICD-10-CM

## 2024-05-23 DIAGNOSIS — Z87442 Personal history of urinary calculi: Secondary | ICD-10-CM | POA: Diagnosis not present

## 2024-05-23 DIAGNOSIS — Z808 Family history of malignant neoplasm of other organs or systems: Secondary | ICD-10-CM | POA: Insufficient documentation

## 2024-05-23 DIAGNOSIS — K5900 Constipation, unspecified: Secondary | ICD-10-CM | POA: Insufficient documentation

## 2024-05-23 DIAGNOSIS — C519 Malignant neoplasm of vulva, unspecified: Secondary | ICD-10-CM

## 2024-05-23 MED ORDER — TRAMADOL HCL 50 MG PO TABS: 50.0000 mg | ORAL_TABLET | Freq: Four times a day (QID) | ORAL | 0 refills | Status: AC | PRN

## 2024-05-23 MED ORDER — SENNOSIDES-DOCUSATE SODIUM 8.6-50 MG PO TABS: 2.0000 | ORAL_TABLET | Freq: Every day | ORAL | 0 refills | Status: DC

## 2024-05-23 NOTE — Patient Instructions (Addendum)
 Preparing for your Surgery  Plan for surgery on June 28, 2024 with Dr. Hoy Masters at [facility]. You will be scheduled for examination under anesthesia, wide local excision of the vulva, possible rotational flap, and any other indicated procedures.   We will plan on giving you a vulvar after surgery kit including a donut pillow (will get for you before surgery), topical lidocaine , peri bottle.  Our social worker is Michelle Zavala and her contact information is [phone].   [facility]illing is [phone] and the pre-service center for cost before surgery is [phone].  Pre-operative Testing -You will receive a phone call from presurgical testing at [facility] to discuss surgery instructions and arrange for lab work if needed.  -Bring your insurance card, copy of an advanced directive if applicable, medication list.  -You should not be taking blood thinners or aspirin at least ten days prior to surgery unless instructed by your surgeon.  -Do not take supplements such as fish oil (omega 3), red yeast rice, turmeric before your surgery. You want to avoid medications with aspirin in them including headache powders such as BC or Goody's), Excedrin migraine.  Day Before Surgery at Home -You will be advised you can have clear liquids up until 3 hours before your surgery.    Your role in recovery Your role is to become active as soon as directed by your doctor, while still giving yourself time to heal.  Rest when you feel tired. You will be asked to do the following in order to speed your recovery:  - Cough and breathe deeply. This helps to clear and expand your lungs and can prevent pneumonia after surgery.  - STAY ACTIVE WHEN YOU GET HOME. Do mild physical activity. Walking or moving your legs help your circulation and body functions return to normal. Do not try to get up or walk alone the first time after surgery.   -If you develop swelling on one leg or  the other, pain in the back of your leg, redness/warmth in one of your legs, please call the office or go to the Emergency Room to have a doppler to rule out a blood clot. For shortness of breath, chest pain-seek care in the Emergency Room as soon as possible. - Actively manage your pain. Managing your pain lets you move in comfort. We will ask you to rate your pain on a scale of zero to 10. It is your responsibility to tell your doctor or nurse where and how much you hurt so your pain can be treated.  Special Considerations -Your final pathology results from surgery should be available around one week after surgery and the results will be relayed to you when available.  -FMLA forms can be faxed to [phone] and please allow 5-7 business days for completion.  Pain Management After Surgery -You will be prescribed your pain medication (tramadol) and bowel regimen medications before surgery so that you can have these available when you are discharged from the hospital. The pain medication is for use ONLY AFTER surgery and a new prescription will not be given.   -Make sure that you have Tylenol  at home IF YOU ARE ABLE TO TAKE THESE MEDICATION to use on a regular basis after surgery for pain control.   -Review the attached handout on narcotic use and their risks and side effects.   Bowel Regimen -You will be prescribed Sennakot-S to take nightly to prevent constipation especially if you are taking the narcotic pain medication  intermittently.  It is important to prevent constipation and drink adequate amounts of liquids. You can stop taking this medication when you are not taking pain medication and you are back on your normal bowel routine.  Risks of Surgery Risks of surgery are low but include bleeding, infection, damage to surrounding structures, re-operation, blood clots, and very rarely death.  AFTER SURGERY INSTRUCTIONS  Return to work:  1-2 weeks if applicable  We recommend purchasing  several bags of frozen green peas and dividing them into ziploc bags. You will want to keep these in the freezer and have them ready to use as ice packs to the vulvar incision. Once the ice pack is no longer cold, you can get another from the freezer. The frozen peas mold to your body better than a regular ice pack.   You can use topical lidocaine  after surgery as a topical gel for pain relief.   Activity: 1. Be up and out of the bed during the day.  Take a nap if needed.  You may walk up steps but be careful and use the hand rail.  Stair climbing will tire you more than you think, you may need to stop part way and rest.   2. No lifting or straining for 4 weeks over 10 pounds. No pushing, pulling, straining for 4 weeks.  3. No driving for minimum 24 hours after surgery but this is usually longer until the following criteria have been met: Do not drive if you are taking narcotic pain medicine and make sure that your reaction time has returned.   4. You can shower as soon as the next day after surgery. Shower daily. No tub baths or submerging your body in water  until cleared by your surgeon. If you have the soap that was given to you by pre-surgical testing that was used before surgery, you do not need to use it afterwards because this can irritate your incisions.   5. No sexual activity and nothing in the vagina for 4-6 weeks.  6. You may experience vulvar spotting and discharge after surgery.  The spotting is normal but if you experience heavy bleeding, call our office.  7. Take Tylenol  and ibuprofen first for pain if you are able to take these medications and only use narcotic pain medication for severe pain not relieved by the Tylenol  or ibuprofen.  Monitor your Tylenol  intake to a max of 4,000 mg in a 24 hour period.   Diet: 1. Low sodium Heart Healthy Diet is recommended but you are cleared to resume your normal (before surgery) diet after your procedure.  2. It is safe to use a laxative,  such as Miralax  or Colace, if you have difficulty moving your bowels. You have been prescribed Sennakot at bedtime every evening to keep bowel movements regular and to prevent constipation.    Wound Care: 1. Keep clean and dry.  Shower daily.  Reasons to call the Doctor: Fever - Oral temperature greater than 100.4 degrees Fahrenheit Foul-smelling vaginal discharge Difficulty urinating Nausea and vomiting Increased pain at the site of the incision that is unrelieved with pain medicine. Difficulty breathing with or without chest pain New calf pain especially if only on one side Sudden, continuing increased vaginal bleeding with or without clots.   Contacts: For questions or concerns you should contact:  Dr. Hoy Masters at [phone]  Eleanor Epps, NP at [phone]  After Hours: call [phone] and have the GYN Oncologist paged/contacted (after 5 pm or on the weekends).  Messages sent via mychart are for non-urgent matters and are not responded to after hours so for urgent needs, please call the after hours number.

## 2024-05-23 NOTE — Progress Notes (Signed)
 GYNECOLOGIC ONCOLOGY NEW PATIENT CONSULTATION  Date of Service: 05/23/2024 Referring Provider: Vonn Inch, MD   ASSESSMENT AND PLAN: Jasmine Fox is a 63 y.o. 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  We reviewed the nature of vulvar dysplasia. Surgical excision is the mainstay of treatment, but ablative therapy or pharmacologic treatment is an option for some patients in certain clinical scenarios. The goals of treatment of vulvar dysplasia are to prevent development of vulvar squamous carcinoma and relieve any associated symptoms, such as pain or itching. The goal is also preserve vulvar anatomy as best as possible.  Excision provides both treatment and a diagnostic specimen for those women with high-grade dysplasia. Invasive squamous cell carcinoma is present at the time of excision in 10-22% of women with VIN on initial biopsy.    Given area of more raised on the right side of the perineal body, additional biopsy performed today. This appears to be near the prior biopsy site. Reviewed benefit of knowing if invasive cancer prior to surgery to consolidate surgeries, but if still only in situ, will proceed with excision as planned and follow-up excision final path to rule out invasion.  She has elected to proceed with surgical excision.  Patient was consented for: simple partial vulvectomy, possible rotational flap on 06/28/24. Reviewed that given location of lesion, may require rotational flap to cover area of excision.  The risks of surgery were discussed in detail and she understands these to including but not limited to bleeding requiring a blood transfusion, infection, injury to adjacent organs (including but not limited to the rectum, urethra, vagina, nearby blood vessels and nerves), thromboembolic events, wound separation, unforseen complication, and possible need for re-exploration.  If the patient experiences any of these events, she understands that her  hospitalization or recovery may be prolonged and that she may need to take additional medications for a prolonged period. The patient will receive DVT and antibiotic prophylaxis as indicated. She voiced a clear understanding. She had the opportunity to ask questions and informed consent was obtained today. She wishes to proceed.  She does not require preoperative clearance. Her METs are >4.  All preoperative instructions were reviewed. Postoperative expectations were also reviewed. Written handouts were provided to the patient.    A copy of this note was sent to the patient's referring provider.  Hoy Masters, MD Gynecologic Oncology   Medical Decision Making I personally spent  TOTAL 60 minutes face-to-face and non-face-to-face in the care of this patient, which includes all pre, intra, and post visit time on the date of service.   ------------  CC: vulvar intraepithelial neoplasia  HISTORY OF PRESENT ILLNESS:  Jasmine Fox is a 63 y.o. woman who is seen in consultation at the request of Vonn Inch, MD for evaluation of vulvar intraepithelial neoplasia.  Patient presented to OB/GYN on 04/12/2024 for routine exam.  At that time, she was noted on exam to have a thick white plaque on the vulva.  She was recommended to return for biopsy.  A punch biopsy was performed on 05/03/2024 which returned with squamous cell carcinoma in situ, peripheral margins involved.  Today patient endorses the history as above.  Reports that she had some itching in the area several years ago and was evaluated.  She reports she was prescribed cream at that time.  She feels that the area has gotten bigger and more itchy in the past year and occasionally bleeds.  Denies any other areas of abnormality that she is  aware of.  Otherwise denies abdominal bloating, early satiety, significant weight loss, change in bowel or bladder habits.    PAST MEDICAL HISTORY: Past Medical History:  Diagnosis Date    Constipation    Migraine    none since 2013   Urolithiasis     PAST SURGICAL HISTORY: Past Surgical History:  Procedure Laterality Date   BREAST BIOPSY Left 08/23/2008   DUCTAL PAPILLOMA WITH CALCIFICATIONS/ FIBROCYSTIC CHANGES AND CALCIFICATIONS   CATARACT EXTRACTION Left 07/27/2018   COLONOSCOPY N/A 11/10/2012   Procedure: COLONOSCOPY;  Surgeon: Lamar CHRISTELLA Hollingshead, MD;  Location: [facility];  Service: Endoscopy;  Laterality: N/A;  11:15   COLONOSCOPY WITH PROPOFOL  N/A 03/09/2023   Procedure: COLONOSCOPY WITH PROPOFOL ;  Surgeon: Hollingshead Lamar CHRISTELLA, MD;  Location: [facility];  Service: Endoscopy;  Laterality: N/A;  9:30 AM, ASA 2   ESOPHAGOGASTRODUODENOSCOPY  02/18/2002   MFM:Wnmfjo esophagus/s/p 54 French Maloney dilator   KIDNEY STONE SURGERY     lt bunion surgery     PARS PLANA VITRECTOMY W/REMOVE OF INTERNAL LIMITING MEMBRANE W/TAMPONADE Right 11/19/2023   Procedure: PARS PLANA VITRECTOMY WITH 25 GAUGE WITH REMOVAL OF INTERNAL LIMITING MEMBRANE OF RETINA WITH INTRAOCULAR TAMPONADE;  Surgeon: Valdemar Rogue, MD;  Location: [facility] OR;  Service: Ophthalmology;  Laterality: Right;  [id number] gas insertion   POLYPECTOMY  03/09/2023   Procedure: POLYPECTOMY INTESTINAL;  Surgeon: Hollingshead Lamar CHRISTELLA, MD;  Location: [facility];  Service: Endoscopy;;    OB/GYN HISTORY: OB History  Gravida Para Term Preterm AB Living  2 2 2   2   SAB IAB Ectopic Multiple Live Births      2    # Outcome Date GA Lbr Len/2nd Weight Sex Type Anes PTL Lv  2 Term      Vag-Spont   LIV  1 Term      Vag-Spont   LIV      Age at menarche: 1 Age at menopause: 74 Hx of HRT: no Hx of STI: no Last pap: 04/12/24 NILMI, HPV HR neg History of abnormal pap smears: Reports abnormality 15 yrs ago but no procedures on cervix (paps dating back to 2015 in epic are normal)  SCREENING STUDIES:  Last mammogram: 05/2023 Last colonoscopy: 2024  MEDICATIONS:  Current Outpatient Medications:    oxymetazoline (AFRIN) 0.05 % nasal  spray, Place 1 spray into both nostrils 2 (two) times daily as needed for congestion., Disp: , Rfl:    Propylene Glycol (SYSTANE COMPLETE) 0.6 % SOLN, Place 1 spray into both eyes as needed (dry eyes)., Disp: , Rfl:   ALLERGIES: No Known Allergies  FAMILY HISTORY: Family History  Problem Relation Age of Onset   Heart disease Mother        congestive heart failure   Diabetes Father    Hypertension Father    Colonic polyp Father    Brain cancer Brother    Hypertension Brother    Colon cancer Neg Hx    Breast cancer Neg Hx    Ovarian cancer Neg Hx    Endometrial cancer Neg Hx    Pancreatic cancer Neg Hx    Prostate cancer Neg Hx     SOCIAL HISTORY: Social History   Socioeconomic History   Marital status: Married    Spouse name: Not on file   Number of children: 0   Years of education: Not on file   Highest education level: Not on file  Occupational History    Employer: [employer]  Tobacco Use  Smoking status: Former   Smokeless tobacco: Never   Tobacco comments:    Only about 2 packs cigarettes in lifetime  Vaping Use   Vaping status: Never Used  Substance and Sexual Activity   Alcohol use: No    Alcohol/week: 0.0 standard drinks of alcohol   Drug use: No   Sexual activity: Not Currently    Birth control/protection: Post-menopausal  Other Topics Concern   Not on file  Social History Narrative   Not on file   Social Drivers of Health   Financial Resource Strain: Low Risk  (04/12/2024)   Overall Financial Resource Strain (CARDIA)    Difficulty of Paying Living Expenses: Not hard at all  Food Insecurity: No Food Insecurity (05/23/2024)   Hunger Vital Sign    Worried About Running Out of Food in the Last Year: Never true    Ran Out of Food in the Last Year: Never true  Transportation Needs: No Transportation Needs (05/23/2024)   PRAPARE - Administrator, Civil Service (Medical): No    Lack of Transportation (Non-Medical): No  Physical Activity:  Sufficiently Active (04/12/2024)   Exercise Vital Sign    Days of Exercise per Week: 4 days    Minutes of Exercise per Session: 60 min  Stress: No Stress Concern Present (04/12/2024)   [employer] of Occupational Health - Occupational Stress Questionnaire    Feeling of Stress: Not at all  Social Connections: Moderately Isolated (04/12/2024)   Social Connection and Isolation Panel    Frequency of Communication with Friends and Family: More than three times a week    Frequency of Social Gatherings with Friends and Family: Once a week    Attends Religious Services: Never    Database Administrator or Organizations: No    Attends Banker Meetings: Never    Marital Status: Married  Catering Manager Violence: Not At Risk (05/23/2024)   Humiliation, Afraid, Rape, and Kick questionnaire    Fear of Current or Ex-Partner: No    Emotionally Abused: No    Physically Abused: No    Sexually Abused: No    REVIEW OF SYSTEMS: New patient intake form was reviewed.  Complete 10-system review is negative except for the following: Anxiety, itching  PHYSICAL EXAM: BP 118/63 (BP Location: Right Arm, Patient Position: Sitting)   Pulse 74   Temp 98.5 F (36.9 C) (Oral)   Resp 18   Ht 5' 4 (1.626 m)   Wt 115 lb 9.6 oz (52.4 kg)   SpO2 100%   BMI 19.84 kg/m  Constitutional: No acute distress. Neuro/Psych: Alert, oriented.  Head and Neck: Normocephalic, atraumatic. Neck symmetric without masses. Sclera anicteric.  Respiratory: Normal work of breathing. Clear to auscultation bilaterally. Cardiovascular: Regular rate and rhythm, no murmurs, rubs, or gallops. Abdomen: Normoactive bowel sounds. Soft, non-distended, non-tender to palpation. No masses appreciated.  Extremities: Grossly normal range of motion. Warm, well perfused. No edema bilaterally. Skin: No rashes or lesions. Lymphatic: No cervical, supraclavicular, or inguinal adenopathy. Genitourinary: External genitalia with  raised white lesion on the perineal body with an approximate 5 mm area on the right aspect that is more raised than the rest of the lesion. Urethral meatus without lesions or prolapse. On speculum exam, vagina and cervix without lesions, atrophic. Bimanual exam reveals normal cervix and uterus, no pelvic mass. Exam chaperoned by Eleanor Epps, NP   VULVAR COLPOSCOPY PROCEDURE NOTE  Procedure Details: After appropriate verbal informed consent was obtained, a timeout was  performed. Acetic acid was applied to the vulva and the vulva was inspected with the colposcope with the findings as noted below. The vulva was then cleansed of the acetic acid with water . The patient tolerated the procedure well.   Adequate Exam: Yes  Biopsy Specimen: Punch biopsy of the right raised portion of the lesion  Condition: Stable. Patient tolerated procedure well.  Complications: None  Findings: With acetic acid applied, acetowhite changes of the plaque-like lesion on the perineal body.  Few punctate areas on the more raised area on the right.  Colposcopic Impression: VIN 3 versus carcinoma   LABORATORY AND RADIOLOGIC DATA: Outside medical records were reviewed to synthesize the above history, along with the history and physical obtained during the visit.  Outside laboratory, pathology reports were reviewed, with pertinent results below.  WBC  Date Value Ref Range Status  11/19/2023 5.8 4.0 - 10.5 K/uL Final   Hemoglobin  Date Value Ref Range Status  11/19/2023 12.0 12.0 - 15.0 g/dL Final   HCT  Date Value Ref Range Status  11/19/2023 38.7 36.0 - 46.0 % Final   Platelets  Date Value Ref Range Status  11/19/2023 226 150 - 400 K/uL Final   Diagnosis  Date Value Ref Range Status  04/12/2024   Final   - Negative for intraepithelial lesion or malignancy (NILM)  09/10/2018   Final   NEGATIVE FOR INTRAEPITHELIAL LESIONS OR MALIGNANCY.  11/03/2016   Final   NEGATIVE FOR INTRAEPITHELIAL LESIONS OR  MALIGNANCY.   HPV  Date Value Ref Range Status  09/10/2018 NOT DETECTED  Final    Comment:    Normal Reference Range - NOT Detected  11/03/2016 NOT DETECTED  Final    Comment:    Normal Reference Range - NOT Detected    Surgical pathology (05/03/24): VULVA, RIGHT, BIOPSY:  -    SQUAMOUS CELL CARCINOMA IN-SITU.  -    PERIPHERAL MARGINS ARE INVOLVED.  -    NOTE: P16 staining is positive and is suggestive of a HPV driven  process. PAS staining is negative for fungal elements.

## 2024-05-23 NOTE — Progress Notes (Signed)
 Patient here for new patient consultation with Dr. Eldonna and for a pre-operative appointment prior to her scheduled surgery on 06/29/2023.  The surgery was discussed in detail.  See after visit summary for additional details.    Discussed post-op pain management in detail including the aspects of the enhanced recovery pathway.  Advised her that a new prescription would be sent in for tramadol and it is only to be used for after her upcoming surgery.  We discussed the use of tylenol  post-op and to monitor for a maximum of 4,000 mg in a 24 hour period.  Also prescribed sennakot to be used after surgery and to hold if having loose stools.  Discussed bowel regimen in detail.     Discussed measures to take at home to prevent DVT including frequent mobility.  Reportable signs and symptoms of DVT discussed. Post-operative instructions discussed and expectations for after surgery. Incisional care discussed as well including reportable signs and symptoms including erythema, drainage, wound separation.     10 minutes spent with the patient.  Verbalizing understanding of material discussed. No needs or concerns voiced at the end of the visit.   Advised patient to call for any needs.  Advised that her post-operative medications had been prescribed and could be picked up at any time.    This appointment is included in the global surgical bundle as pre-operative teaching and has no charge.

## 2024-05-23 NOTE — Patient Instructions (Signed)
 Preparing for your Surgery  Plan for surgery on June 28, 2024 with Dr. Hoy Masters at [facility]. You will be scheduled for examination under anesthesia, wide local excision of the vulva, possible rotational flap, and any other indicated procedures.   We will plan on giving you a vulvar after surgery kit including a donut pillow (will get for you before surgery), topical lidocaine , peri bottle.  Our social worker is Michelle Zavala and her contact information is [phone].   [facility]illing is [phone] and the pre-service center for cost before surgery is [phone].  Pre-operative Testing -You will receive a phone call from presurgical testing at [facility] to discuss surgery instructions and arrange for lab work if needed.  -Bring your insurance card, copy of an advanced directive if applicable, medication list.  -You should not be taking blood thinners or aspirin at least ten days prior to surgery unless instructed by your surgeon.  -Do not take supplements such as fish oil (omega 3), red yeast rice, turmeric before your surgery. You want to avoid medications with aspirin in them including headache powders such as BC or Goody's), Excedrin migraine.  Day Before Surgery at Home -You will be advised you can have clear liquids up until 3 hours before your surgery.    Your role in recovery Your role is to become active as soon as directed by your doctor, while still giving yourself time to heal.  Rest when you feel tired. You will be asked to do the following in order to speed your recovery:  - Cough and breathe deeply. This helps to clear and expand your lungs and can prevent pneumonia after surgery.  - STAY ACTIVE WHEN YOU GET HOME. Do mild physical activity. Walking or moving your legs help your circulation and body functions return to normal. Do not try to get up or walk alone the first time after surgery.   -If you develop swelling on one leg or  the other, pain in the back of your leg, redness/warmth in one of your legs, please call the office or go to the Emergency Room to have a doppler to rule out a blood clot. For shortness of breath, chest pain-seek care in the Emergency Room as soon as possible. - Actively manage your pain. Managing your pain lets you move in comfort. We will ask you to rate your pain on a scale of zero to 10. It is your responsibility to tell your doctor or nurse where and how much you hurt so your pain can be treated.  Special Considerations -Your final pathology results from surgery should be available around one week after surgery and the results will be relayed to you when available.  -FMLA forms can be faxed to [phone] and please allow 5-7 business days for completion.  Pain Management After Surgery -You will be prescribed your pain medication (tramadol) and bowel regimen medications before surgery so that you can have these available when you are discharged from the hospital. The pain medication is for use ONLY AFTER surgery and a new prescription will not be given.   -Make sure that you have Tylenol  at home IF YOU ARE ABLE TO TAKE THESE MEDICATION to use on a regular basis after surgery for pain control.   -Review the attached handout on narcotic use and their risks and side effects.   Bowel Regimen -You will be prescribed Sennakot-S to take nightly to prevent constipation especially if you are taking the narcotic pain medication  intermittently.  It is important to prevent constipation and drink adequate amounts of liquids. You can stop taking this medication when you are not taking pain medication and you are back on your normal bowel routine.  Risks of Surgery Risks of surgery are low but include bleeding, infection, damage to surrounding structures, re-operation, blood clots, and very rarely death.  AFTER SURGERY INSTRUCTIONS  Return to work:  1-2 weeks if applicable  We recommend purchasing  several bags of frozen green peas and dividing them into ziploc bags. You will want to keep these in the freezer and have them ready to use as ice packs to the vulvar incision. Once the ice pack is no longer cold, you can get another from the freezer. The frozen peas mold to your body better than a regular ice pack.   You can use topical lidocaine  after surgery as a topical gel for pain relief.   Activity: 1. Be up and out of the bed during the day.  Take a nap if needed.  You may walk up steps but be careful and use the hand rail.  Stair climbing will tire you more than you think, you may need to stop part way and rest.   2. No lifting or straining for 4 weeks over 10 pounds. No pushing, pulling, straining for 4 weeks.  3. No driving for minimum 24 hours after surgery but this is usually longer until the following criteria have been met: Do not drive if you are taking narcotic pain medicine and make sure that your reaction time has returned.   4. You can shower as soon as the next day after surgery. Shower daily. No tub baths or submerging your body in water  until cleared by your surgeon. If you have the soap that was given to you by pre-surgical testing that was used before surgery, you do not need to use it afterwards because this can irritate your incisions.   5. No sexual activity and nothing in the vagina for 4-6 weeks.  6. You may experience vulvar spotting and discharge after surgery.  The spotting is normal but if you experience heavy bleeding, call our office.  7. Take Tylenol  and ibuprofen first for pain if you are able to take these medications and only use narcotic pain medication for severe pain not relieved by the Tylenol  or ibuprofen.  Monitor your Tylenol  intake to a max of 4,000 mg in a 24 hour period.   Diet: 1. Low sodium Heart Healthy Diet is recommended but you are cleared to resume your normal (before surgery) diet after your procedure.  2. It is safe to use a laxative,  such as Miralax  or Colace, if you have difficulty moving your bowels. You have been prescribed Sennakot at bedtime every evening to keep bowel movements regular and to prevent constipation.    Wound Care: 1. Keep clean and dry.  Shower daily.  Reasons to call the Doctor: Fever - Oral temperature greater than 100.4 degrees Fahrenheit Foul-smelling vaginal discharge Difficulty urinating Nausea and vomiting Increased pain at the site of the incision that is unrelieved with pain medicine. Difficulty breathing with or without chest pain New calf pain especially if only on one side Sudden, continuing increased vaginal bleeding with or without clots.   Contacts: For questions or concerns you should contact:  Dr. Hoy Masters at [phone]  Eleanor Epps, NP at [phone]  After Hours: call [phone] and have the GYN Oncologist paged/contacted (after 5 pm or on the weekends).  you should contact:   Dr. Hoy Masters at [phone]   Eleanor Epps, NP at [phone]   After Hours: call [phone] and have the GYN Oncologist paged/contacted (after 5 pm or on the weekends).   Messages sent via mychart are for non-urgent matters and are not responded to after hours so for urgent needs, please call the after hours number.

## 2024-05-23 NOTE — H&P (View-Only) (Signed)
 GYNECOLOGIC ONCOLOGY NEW PATIENT CONSULTATION  Date of Service: 05/23/2024 Referring Provider: Vonn Inch, MD   ASSESSMENT AND PLAN: Jasmine Fox is a 63 y.o. 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  We reviewed the nature of vulvar dysplasia. Surgical excision is the mainstay of treatment, but ablative therapy or pharmacologic treatment is an option for some patients in certain clinical scenarios. The goals of treatment of vulvar dysplasia are to prevent development of vulvar squamous carcinoma and relieve any associated symptoms, such as pain or itching. The goal is also preserve vulvar anatomy as best as possible.  Excision provides both treatment and a diagnostic specimen for those women with high-grade dysplasia. Invasive squamous cell carcinoma is present at the time of excision in 10-22% of women with VIN on initial biopsy.    Given area of more raised on the right side of the perineal body, additional biopsy performed today. This appears to be near the prior biopsy site. Reviewed benefit of knowing if invasive cancer prior to surgery to consolidate surgeries, but if still only in situ, will proceed with excision as planned and follow-up excision final path to rule out invasion.  She has elected to proceed with surgical excision.  Patient was consented for: simple partial vulvectomy, possible rotational flap on 06/28/24. Reviewed that given location of lesion, may require rotational flap to cover area of excision.  The risks of surgery were discussed in detail and she understands these to including but not limited to bleeding requiring a blood transfusion, infection, injury to adjacent organs (including but not limited to the rectum, urethra, vagina, nearby blood vessels and nerves), thromboembolic events, wound separation, unforseen complication, and possible need for re-exploration.  If the patient experiences any of these events, she understands that her  hospitalization or recovery may be prolonged and that she may need to take additional medications for a prolonged period. The patient will receive DVT and antibiotic prophylaxis as indicated. She voiced a clear understanding. She had the opportunity to ask questions and informed consent was obtained today. She wishes to proceed.  She does not require preoperative clearance. Her METs are >4.  All preoperative instructions were reviewed. Postoperative expectations were also reviewed. Written handouts were provided to the patient.    A copy of this note was sent to the patient's referring provider.  Hoy Masters, MD Gynecologic Oncology   Medical Decision Making I personally spent  TOTAL 60 minutes face-to-face and non-face-to-face in the care of this patient, which includes all pre, intra, and post visit time on the date of service.   ------------  CC: vulvar intraepithelial neoplasia  HISTORY OF PRESENT ILLNESS:  Jasmine Fox is a 63 y.o. woman who is seen in consultation at the request of Vonn Inch, MD for evaluation of vulvar intraepithelial neoplasia.  Patient presented to OB/GYN on 04/12/2024 for routine exam.  At that time, she was noted on exam to have a thick white plaque on the vulva.  She was recommended to return for biopsy.  A punch biopsy was performed on 05/03/2024 which returned with squamous cell carcinoma in situ, peripheral margins involved.  Today patient endorses the history as above.  Reports that she had some itching in the area several years ago and was evaluated.  She reports she was prescribed cream at that time.  She feels that the area has gotten bigger and more itchy in the past year and occasionally bleeds.  Denies any other areas of abnormality that she is  aware of.  Otherwise denies abdominal bloating, early satiety, significant weight loss, change in bowel or bladder habits.    PAST MEDICAL HISTORY: Past Medical History:  Diagnosis Date    Constipation    Migraine    none since 2013   Urolithiasis     PAST SURGICAL HISTORY: Past Surgical History:  Procedure Laterality Date   BREAST BIOPSY Left 08/23/2008   DUCTAL PAPILLOMA WITH CALCIFICATIONS/ FIBROCYSTIC CHANGES AND CALCIFICATIONS   CATARACT EXTRACTION Left 07/27/2018   COLONOSCOPY N/A 11/10/2012   Procedure: COLONOSCOPY;  Surgeon: Lamar CHRISTELLA Hollingshead, MD;  Location: [facility];  Service: Endoscopy;  Laterality: N/A;  11:15   COLONOSCOPY WITH PROPOFOL  N/A 03/09/2023   Procedure: COLONOSCOPY WITH PROPOFOL ;  Surgeon: Hollingshead Lamar CHRISTELLA, MD;  Location: [facility];  Service: Endoscopy;  Laterality: N/A;  9:30 AM, ASA 2   ESOPHAGOGASTRODUODENOSCOPY  02/18/2002   MFM:Wnmfjo esophagus/s/p 54 French Maloney dilator   KIDNEY STONE SURGERY     lt bunion surgery     PARS PLANA VITRECTOMY W/REMOVE OF INTERNAL LIMITING MEMBRANE W/TAMPONADE Right 11/19/2023   Procedure: PARS PLANA VITRECTOMY WITH 25 GAUGE WITH REMOVAL OF INTERNAL LIMITING MEMBRANE OF RETINA WITH INTRAOCULAR TAMPONADE;  Surgeon: Valdemar Rogue, MD;  Location: [facility] OR;  Service: Ophthalmology;  Laterality: Right;  [id number] gas insertion   POLYPECTOMY  03/09/2023   Procedure: POLYPECTOMY INTESTINAL;  Surgeon: Hollingshead Lamar CHRISTELLA, MD;  Location: [facility];  Service: Endoscopy;;    OB/GYN HISTORY: OB History  Gravida Para Term Preterm AB Living  2 2 2   2   SAB IAB Ectopic Multiple Live Births      2    # Outcome Date GA Lbr Len/2nd Weight Sex Type Anes PTL Lv  2 Term      Vag-Spont   LIV  1 Term      Vag-Spont   LIV      Age at menarche: 1 Age at menopause: 63 Hx of HRT: no Hx of STI: no Last pap: 04/12/24 NILMI, HPV HR neg History of abnormal pap smears: Reports abnormality 15 yrs ago but no procedures on cervix (paps dating back to 2015 in epic are normal)  SCREENING STUDIES:  Last mammogram: 05/2023 Last colonoscopy: 2024  MEDICATIONS:  Current Outpatient Medications:    oxymetazoline (AFRIN) 0.05 % nasal  spray, Place 1 spray into both nostrils 2 (two) times daily as needed for congestion., Disp: , Rfl:    Propylene Glycol (SYSTANE COMPLETE) 0.6 % SOLN, Place 1 spray into both eyes as needed (dry eyes)., Disp: , Rfl:   ALLERGIES: No Known Allergies  FAMILY HISTORY: Family History  Problem Relation Age of Onset   Heart disease Mother        congestive heart failure   Diabetes Father    Hypertension Father    Colonic polyp Father    Brain cancer Brother    Hypertension Brother    Colon cancer Neg Hx    Breast cancer Neg Hx    Ovarian cancer Neg Hx    Endometrial cancer Neg Hx    Pancreatic cancer Neg Hx    Prostate cancer Neg Hx     SOCIAL HISTORY: Social History   Socioeconomic History   Marital status: Married    Spouse name: Not on file   Number of children: 0   Years of education: Not on file   Highest education level: Not on file  Occupational History    Employer: [employer]  Tobacco Use  Smoking status: Former   Smokeless tobacco: Never   Tobacco comments:    Only about 2 packs cigarettes in lifetime  Vaping Use   Vaping status: Never Used  Substance and Sexual Activity   Alcohol use: No    Alcohol/week: 0.0 standard drinks of alcohol   Drug use: No   Sexual activity: Not Currently    Birth control/protection: Post-menopausal  Other Topics Concern   Not on file  Social History Narrative   Not on file   Social Drivers of Health   Financial Resource Strain: Low Risk  (04/12/2024)   Overall Financial Resource Strain (CARDIA)    Difficulty of Paying Living Expenses: Not hard at all  Food Insecurity: No Food Insecurity (05/23/2024)   Hunger Vital Sign    Worried About Running Out of Food in the Last Year: Never true    Ran Out of Food in the Last Year: Never true  Transportation Needs: No Transportation Needs (05/23/2024)   PRAPARE - Administrator, Civil Service (Medical): No    Lack of Transportation (Non-Medical): No  Physical Activity:  Sufficiently Active (04/12/2024)   Exercise Vital Sign    Days of Exercise per Week: 4 days    Minutes of Exercise per Session: 60 min  Stress: No Stress Concern Present (04/12/2024)   [employer] of Occupational Health - Occupational Stress Questionnaire    Feeling of Stress: Not at all  Social Connections: Moderately Isolated (04/12/2024)   Social Connection and Isolation Panel    Frequency of Communication with Friends and Family: More than three times a week    Frequency of Social Gatherings with Friends and Family: Once a week    Attends Religious Services: Never    Database Administrator or Organizations: No    Attends Banker Meetings: Never    Marital Status: Married  Catering Manager Violence: Not At Risk (05/23/2024)   Humiliation, Afraid, Rape, and Kick questionnaire    Fear of Current or Ex-Partner: No    Emotionally Abused: No    Physically Abused: No    Sexually Abused: No    REVIEW OF SYSTEMS: New patient intake form was reviewed.  Complete 10-system review is negative except for the following: Anxiety, itching  PHYSICAL EXAM: BP 118/63 (BP Location: Right Arm, Patient Position: Sitting)   Pulse 74   Temp 98.5 F (36.9 C) (Oral)   Resp 18   Ht 5' 4 (1.626 m)   Wt 115 lb 9.6 oz (52.4 kg)   SpO2 100%   BMI 19.84 kg/m  Constitutional: No acute distress. Neuro/Psych: Alert, oriented.  Head and Neck: Normocephalic, atraumatic. Neck symmetric without masses. Sclera anicteric.  Respiratory: Normal work of breathing. Clear to auscultation bilaterally. Cardiovascular: Regular rate and rhythm, no murmurs, rubs, or gallops. Abdomen: Normoactive bowel sounds. Soft, non-distended, non-tender to palpation. No masses appreciated.  Extremities: Grossly normal range of motion. Warm, well perfused. No edema bilaterally. Skin: No rashes or lesions. Lymphatic: No cervical, supraclavicular, or inguinal adenopathy. Genitourinary: External genitalia with  raised white lesion on the perineal body with an approximate 5 mm area on the right aspect that is more raised than the rest of the lesion. Urethral meatus without lesions or prolapse. On speculum exam, vagina and cervix without lesions, atrophic. Bimanual exam reveals normal cervix and uterus, no pelvic mass. Exam chaperoned by Eleanor Epps, NP   VULVAR COLPOSCOPY PROCEDURE NOTE  Procedure Details: After appropriate verbal informed consent was obtained, a timeout was  performed. Acetic acid was applied to the vulva and the vulva was inspected with the colposcope with the findings as noted below. The vulva was then cleansed of the acetic acid with water . The patient tolerated the procedure well.   Adequate Exam: Yes  Biopsy Specimen: Punch biopsy of the right raised portion of the lesion  Condition: Stable. Patient tolerated procedure well.  Complications: None  Findings: With acetic acid applied, acetowhite changes of the plaque-like lesion on the perineal body.  Few punctate areas on the more raised area on the right.  Colposcopic Impression: VIN 3 versus carcinoma   LABORATORY AND RADIOLOGIC DATA: Outside medical records were reviewed to synthesize the above history, along with the history and physical obtained during the visit.  Outside laboratory, pathology reports were reviewed, with pertinent results below.  WBC  Date Value Ref Range Status  11/19/2023 5.8 4.0 - 10.5 K/uL Final   Hemoglobin  Date Value Ref Range Status  11/19/2023 12.0 12.0 - 15.0 g/dL Final   HCT  Date Value Ref Range Status  11/19/2023 38.7 36.0 - 46.0 % Final   Platelets  Date Value Ref Range Status  11/19/2023 226 150 - 400 K/uL Final   Diagnosis  Date Value Ref Range Status  04/12/2024   Final   - Negative for intraepithelial lesion or malignancy (NILM)  09/10/2018   Final   NEGATIVE FOR INTRAEPITHELIAL LESIONS OR MALIGNANCY.  11/03/2016   Final   NEGATIVE FOR INTRAEPITHELIAL LESIONS OR  MALIGNANCY.   HPV  Date Value Ref Range Status  09/10/2018 NOT DETECTED  Final    Comment:    Normal Reference Range - NOT Detected  11/03/2016 NOT DETECTED  Final    Comment:    Normal Reference Range - NOT Detected    Surgical pathology (05/03/24): VULVA, RIGHT, BIOPSY:  -    SQUAMOUS CELL CARCINOMA IN-SITU.  -    PERIPHERAL MARGINS ARE INVOLVED.  -    NOTE: P16 staining is positive and is suggestive of a HPV driven  process. PAS staining is negative for fungal elements.

## 2024-05-25 ENCOUNTER — Telehealth: Payer: Self-pay

## 2024-05-25 LAB — SURGICAL PATHOLOGY

## 2024-05-25 NOTE — Telephone Encounter (Signed)
 Jasmine Fox states she will take the 12/30 surgery date. She is aware she will be getting a call from [facility] for pre-admit testing

## 2024-05-25 NOTE — Telephone Encounter (Signed)
 Jasmine Fox called to speak to Jasmine Epps NP regarding her upcoming surgery with Dr.Newton.  She states she is scheduled for surgery on 1/6 and would like to have her surgery before the end of the year because she has already met her deductible. She received her out of pocket cost for the surgery and it will be $9,200 if she has it in January.   Message sent to Jasmine Epps NP

## 2024-05-30 ENCOUNTER — Ambulatory Visit: Payer: Self-pay | Admitting: Psychiatry

## 2024-06-09 NOTE — Patient Instructions (Signed)
 SURGICAL WAITING ROOM VISITATION  Patients having surgery or a procedure may have no more than 2 support people in the waiting area - these visitors may rotate.    Children ages 74 and under will not be able to visit patients in [facility] under most circumstances.   Visitors with respiratory illnesses are discouraged from visiting and should remain at home.  If the patient needs to stay at the hospital during part of their recovery, the visitor guidelines for inpatient rooms apply. Pre-op nurse will coordinate an appropriate time for 1 support person to accompany patient in pre-op.  This support person may not rotate.    Please refer to the [facility] website for the visitor guidelines for Inpatients (after your surgery is over and you are in a regular room).       Your procedure is scheduled on: 06/21/24   Report to [facility] Main Entrance    Report to admitting at 5:15 AM   Call this number if you have problems the morning of surgery [phone]   Do not eat food :After Midnight.   After Midnight you may have the following liquids until 4:30 AM DAY OF SURGERY  Water  Non-Citrus Juices (without pulp, NO RED-Apple, White grape, White cranberry) Black Coffee (NO MILK/CREAM OR CREAMERS, sugar ok)  Clear Tea (NO MILK/CREAM OR CREAMERS, sugar ok) regular and decaf                             Plain Jell-O (NO RED)                                           Fruit ices (not with fruit pulp, NO RED)                                     Popsicles (NO RED)                                                               Sports drinks like Gatorade (NO RED)                 Oral Hygiene is also important to reduce your risk of infection.                                    Remember - BRUSH YOUR TEETH THE MORNING OF SURGERY WITH YOUR REGULAR TOOTHPASTE   Stop all vitamins and herbal supplements 7 days before surgery.   Take these medicines the morning of surgery with A  SIP OF WATER : nasal spray if needed.             You may not have any metal on your body including hair pins, jewelry, and body piercing             Do not wear make-up, lotions, powders, perfumes/cologne, or deodorant  Do not wear nail polish including gel and S&S, artificial/acrylic nails, or any other type of covering on  natural nails including finger and toenails. If you have artificial nails, gel coating, etc. that needs to be removed by a nail salon please have this removed prior to surgery or surgery may need to be canceled/ delayed if the surgeon/ anesthesia feels like they are unable to be safely monitored.   Do not shave  48 hours prior to surgery.              Do not bring valuables to the hospital. [facility] IS NOT             RESPONSIBLE   FOR VALUABLES.   Contacts, glasses, dentures or bridgework may not be worn into surgery.    DO NOT BRING YOUR HOME MEDICATIONS TO THE HOSPITAL. PHARMACY WILL DISPENSE MEDICATIONS LISTED ON YOUR MEDICATION LIST TO YOU DURING YOUR ADMISSION IN THE HOSPITAL!    Patients discharged on the day of surgery will not be allowed to drive home.  Someone NEEDS to stay with you for the first 24 hours after anesthesia.   Special Instructions: Bring a copy of your healthcare power of attorney and living will documents the day of surgery if you haven't scanned them before.              Please read over the following fact sheets you were given: IF YOU HAVE QUESTIONS ABOUT YOUR PRE-OP INSTRUCTIONS PLEASE CALL [phone] [place]   If you received a COVID test during your pre-op visit  it is requested that you wear a mask when out in public, stay away from anyone that may not be feeling well and notify your surgeon if you develop symptoms. If you test positive for Covid or have been in contact with anyone that has tested positive in the last 10 days please notify you surgeon.    [facility] - Preparing for Surgery Before surgery, you can play an important  role.  Because skin is not sterile, your skin needs to be as free of germs as possible.  You can reduce the number of germs on your skin by washing with CHG (chlorahexidine gluconate) soap before surgery.  CHG is an antiseptic cleaner which kills germs and bonds with the skin to continue killing germs even after washing. Please DO NOT use if you have an allergy to CHG or antibacterial soaps.  If your skin becomes reddened/irritated stop using the CHG and inform your nurse when you arrive at Short Stay. Do not shave (including legs and underarms) for at least 48 hours prior to the first CHG shower.  You may shave your face/neck.  Please follow these instructions carefully:  1.  Shower with CHG Soap the night before surgery ONLY (DO NOT USE THE SOAP THE MORNING OF SURGERY).  2.  If you choose to wash your hair, wash your hair first as usual with your normal  shampoo.  3.  After you shampoo, rinse your hair and body thoroughly to remove the shampoo.                             4.  Use CHG as you would any other liquid soap.  You can apply chg directly to the skin and wash.  Gently with a scrungie or clean washcloth.  5.  Apply the CHG Soap to your body ONLY FROM THE NECK DOWN.   Do   not use on face/ open  Wound or open sores. Avoid contact with eyes, ears mouth and   genitals (private parts).                       Wash face,  Genitals (private parts) with your normal soap.             6.  Wash thoroughly, paying special attention to the area where your    surgery  will be performed.  7.  Thoroughly rinse your body with warm water  from the neck down.  8.  DO NOT shower/wash with your normal soap after using and rinsing off the CHG Soap.                9.  Pat yourself dry with a clean towel.            10.  Wear clean pajamas.            11.  Place clean sheets on your bed the night of your first shower and do not  sleep with pets. Day of Surgery : Do not apply any CHG,  lotions/deodorants the morning of surgery.  Please wear clean clothes to the hospital/surgery center.  FAILURE TO FOLLOW THESE INSTRUCTIONS MAY RESULT IN THE CANCELLATION OF YOUR SURGERY  PATIENT SIGNATURE_________________________________  NURSE SIGNATURE__________________________________  ________________________________________________________________________

## 2024-06-09 NOTE — Progress Notes (Signed)
 COVID Vaccine received:  []  No [x]  Yes Date of any COVID positive Test in last 90 days: none PCP - Jerel Sieving MD Cardiologist - n/a  Chest x-ray -  EKG -   Stress Test -  ECHO -  Cardiac Cath -   Bowel Prep - [x]  No  []   Yes ______  Pacemaker / ICD device [x]  No []  Yes   Spinal Cord Stimulator:[x]  No []  Yes       History of Sleep Apnea? [x]  No []  Yes   CPAP used?- [x]  No []  Yes    Does the patient monitor blood sugar?          [x]  No []  Yes  []  N/A  Patient has: [x]  NO Hx DM   []  Pre-DM                 []  DM1  []   DM2 Does patient have a [employer] or Dexacom? []  No []  Yes   Fasting Blood Sugar Ranges-  Checks Blood Sugar _____ times a day  GLP1 agonist / usual dose - no GLP1 instructions:  SGLT-2 inhibitors / usual dose - no SGLT-2 instructions:   Blood Thinner / Instructions:no Aspirin Instructions:no  Comments:   Activity level: Patient is able to climb a flight of stairs without difficulty; [x]  No CP  [x]  No SOB,  Patient can  perform ADLs without assistance.   Anesthesia review:   Patient denies shortness of breath, fever, cough and chest pain at PAT appointment.  Patient verbalized understanding and agreement to the Pre-Surgical Instructions that were given to them at this PAT appointment. Patient was also educated of the need to review these PAT instructions again prior to his/her surgery.I reviewed the appropriate phone numbers to call if they have any and questions or concerns.

## 2024-06-10 ENCOUNTER — Encounter (HOSPITAL_COMMUNITY): Payer: Self-pay

## 2024-06-10 ENCOUNTER — Encounter (HOSPITAL_COMMUNITY)
Admission: RE | Admit: 2024-06-10 | Discharge: 2024-06-10 | Disposition: A | Discharge status: Home / Self Care | Source: Ambulatory Visit | Attending: Psychiatry | Admitting: Psychiatry

## 2024-06-10 ENCOUNTER — Other Ambulatory Visit: Payer: Self-pay

## 2024-06-10 VITALS — BP 110/84 | HR 83 | Temp 98.2°F | Resp 16 | Ht 64.0 in | Wt 110.0 lb

## 2024-06-10 DIAGNOSIS — Z01812 Encounter for preprocedural laboratory examination: Secondary | ICD-10-CM | POA: Insufficient documentation

## 2024-06-10 DIAGNOSIS — Z01818 Encounter for other preprocedural examination: Secondary | ICD-10-CM

## 2024-06-10 HISTORY — DX: Other complications of anesthesia, initial encounter: T88.59XA

## 2024-06-10 LAB — CBC
HCT: 38.1 % (ref 36.0–46.0)
Hemoglobin: 12 g/dL (ref 12.0–15.0)
MCH: 28.1 pg (ref 26.0–34.0)
MCHC: 31.5 g/dL (ref 30.0–36.0)
MCV: 89.2 fL (ref 80.0–100.0)
Platelets: 272 K/uL (ref 150–400)
RBC: 4.27 MIL/uL (ref 3.87–5.11)
RDW: 14 % (ref 11.5–15.5)
WBC: 5.9 K/uL (ref 4.0–10.5)
nRBC: 0 % (ref 0.0–0.2)

## 2024-06-13 ENCOUNTER — Telehealth: Payer: Self-pay | Admitting: *Deleted

## 2024-06-13 ENCOUNTER — Ambulatory Visit (HOSPITAL_COMMUNITY)
Admission: RE | Admit: 2024-06-13 | Discharge: 2024-06-13 | Disposition: A | Discharge status: Home / Self Care | Source: Ambulatory Visit | Attending: Family Medicine | Admitting: Family Medicine

## 2024-06-13 DIAGNOSIS — Z1231 Encounter for screening mammogram for malignant neoplasm of breast: Secondary | ICD-10-CM | POA: Insufficient documentation

## 2024-06-13 NOTE — Telephone Encounter (Signed)
 Spoke with patient and relayed message from Eleanor Epps, NP that patient's recent CBC is normal. Pt verbalized understanding and thanked the office for calling.

## 2024-06-13 NOTE — Telephone Encounter (Signed)
-----   Message from Eleanor Epps, NP sent at 06/13/2024  4:36 PM EST ----- Please let her know her recent CBC is normal.

## 2024-06-20 ENCOUNTER — Telehealth: Payer: Self-pay | Admitting: *Deleted

## 2024-06-20 NOTE — Telephone Encounter (Signed)
 Telephone call to check on pre-operative status.  Patient compliant with pre-operative instructions.  Reinforced nothing to eat after midnight. Clear liquids until 0415. Patient to arrive at 0515.  No questions or concerns voiced.  Instructed to call for any needs.

## 2024-06-21 ENCOUNTER — Encounter (HOSPITAL_COMMUNITY)
Admission: RE | Disposition: A | Discharge status: Home / Self Care | Payer: Self-pay | Source: Home / Self Care | Attending: Psychiatry

## 2024-06-21 ENCOUNTER — Ambulatory Visit (HOSPITAL_COMMUNITY)
Admission: RE | Admit: 2024-06-21 | Discharge: 2024-06-21 | Disposition: A | Discharge status: Home / Self Care | Attending: Psychiatry | Admitting: Psychiatry

## 2024-06-21 ENCOUNTER — Encounter (HOSPITAL_COMMUNITY): Payer: Self-pay | Admitting: Psychiatry

## 2024-06-21 ENCOUNTER — Ambulatory Visit (HOSPITAL_COMMUNITY): Admitting: Certified Registered Nurse Anesthetist

## 2024-06-21 DIAGNOSIS — D071 Carcinoma in situ of vulva: Secondary | ICD-10-CM | POA: Insufficient documentation

## 2024-06-21 DIAGNOSIS — Z87891 Personal history of nicotine dependence: Secondary | ICD-10-CM | POA: Insufficient documentation

## 2024-06-21 HISTORY — PX: VULVECTOMY: SHX1086

## 2024-06-21 SURGERY — WIDE EXCISION VULVECTOMY
Anesthesia: General | Site: "Vagina "

## 2024-06-21 MED ORDER — LACTATED RINGERS IV SOLN: INTRAVENOUS | Status: DC

## 2024-06-21 MED ORDER — SCOPOLAMINE 1 MG/3DAYS TD PT72
1.0000 | MEDICATED_PATCH | TRANSDERMAL | Status: DC
  Administered 2024-06-21: 1 mg via TRANSDERMAL
  Filled 2024-06-21: qty 1

## 2024-06-21 MED ORDER — ONDANSETRON HCL 4 MG/2ML IJ SOLN
INTRAMUSCULAR | Status: AC
  Filled 2024-06-21: qty 2

## 2024-06-21 MED ORDER — ACETIC ACID 5 % SOLN
Status: AC
  Filled 2024-06-21: qty 12

## 2024-06-21 MED ORDER — GLYCOPYRROLATE 0.2 MG/ML IJ SOLN
INTRAMUSCULAR | Status: AC
  Filled 2024-06-21: qty 1

## 2024-06-21 MED ORDER — MIDAZOLAM HCL 5 MG/5ML IJ SOLN
INTRAMUSCULAR | Status: DC | PRN
  Administered 2024-06-21: 2 mg via INTRAVENOUS

## 2024-06-21 MED ORDER — FENTANYL CITRATE (PF) 100 MCG/2ML IJ SOLN
INTRAMUSCULAR | Status: DC | PRN
  Administered 2024-06-21 (×2): 50 ug via INTRAVENOUS

## 2024-06-21 MED ORDER — PROPOFOL 10 MG/ML IV BOLUS
INTRAVENOUS | Status: DC | PRN
  Administered 2024-06-21: 150 mg via INTRAVENOUS

## 2024-06-21 MED ORDER — GLYCOPYRROLATE 0.2 MG/ML IJ SOLN
INTRAMUSCULAR | Status: DC | PRN
  Administered 2024-06-21: .2 mg via INTRAVENOUS

## 2024-06-21 MED ORDER — FENTANYL CITRATE (PF) 100 MCG/2ML IJ SOLN
INTRAMUSCULAR | Status: AC
  Filled 2024-06-21: qty 2

## 2024-06-21 MED ORDER — DEXAMETHASONE SOD PHOSPHATE PF 10 MG/ML IJ SOLN: 4.0000 mg | INTRAMUSCULAR | Status: DC

## 2024-06-21 MED ORDER — HEPARIN SODIUM (PORCINE) 5000 UNIT/ML IJ SOLN
5000.0000 [IU] | INTRAMUSCULAR | Status: AC
  Administered 2024-06-21: 5000 [IU] via SUBCUTANEOUS
  Filled 2024-06-21: qty 1

## 2024-06-21 MED ORDER — ONDANSETRON HCL 4 MG/2ML IJ SOLN
INTRAMUSCULAR | Status: DC | PRN
  Administered 2024-06-21: 4 mg via INTRAVENOUS

## 2024-06-21 MED ORDER — CHLORHEXIDINE GLUCONATE 0.12 % MT SOLN
15.0000 mL | Freq: Once | OROMUCOSAL | Status: AC
  Administered 2024-06-21: 15 mL via OROMUCOSAL

## 2024-06-21 MED ORDER — ORAL CARE MOUTH RINSE: 15.0000 mL | Freq: Once | OROMUCOSAL | Status: AC

## 2024-06-21 MED ORDER — LIDOCAINE HCL (CARDIAC) PF 100 MG/5ML IV SOSY
PREFILLED_SYRINGE | INTRAVENOUS | Status: DC | PRN
  Administered 2024-06-21: 60 mg via INTRATRACHEAL

## 2024-06-21 MED ORDER — PROPOFOL 500 MG/50ML IV EMUL
INTRAVENOUS | Status: DC | PRN
  Administered 2024-06-21: 200 ug/kg/min via INTRAVENOUS

## 2024-06-21 MED ORDER — DEXAMETHASONE SODIUM PHOSPHATE 4 MG/ML IJ SOLN
INTRAMUSCULAR | Status: DC | PRN
  Administered 2024-06-21: 5 mg via INTRAVENOUS

## 2024-06-21 MED ORDER — ACETIC ACID 5 % SOLN
Status: DC | PRN
  Administered 2024-06-21: 1 via TOPICAL

## 2024-06-21 MED ORDER — AMISULPRIDE (ANTIEMETIC) 5 MG/2ML IV SOLN: 10.0000 mg | Freq: Once | INTRAVENOUS | Status: DC | PRN

## 2024-06-21 MED ORDER — KETOROLAC TROMETHAMINE 30 MG/ML IJ SOLN
INTRAMUSCULAR | Status: DC | PRN
  Administered 2024-06-21: 30 mg via INTRAVENOUS

## 2024-06-21 MED ORDER — FENTANYL CITRATE (PF) 50 MCG/ML IJ SOSY: 25.0000 ug | PREFILLED_SYRINGE | INTRAMUSCULAR | Status: DC | PRN

## 2024-06-21 MED ORDER — OXYCODONE HCL 5 MG/5ML PO SOLN: 5.0000 mg | Freq: Once | ORAL | Status: DC | PRN

## 2024-06-21 MED ORDER — KETOROLAC TROMETHAMINE 30 MG/ML IJ SOLN
INTRAMUSCULAR | Status: AC
  Filled 2024-06-21: qty 1

## 2024-06-21 MED ORDER — EPHEDRINE 5 MG/ML INJ
INTRAVENOUS | Status: AC
  Filled 2024-06-21: qty 5

## 2024-06-21 MED ORDER — BUPIVACAINE HCL 0.25 % IJ SOLN
INTRAMUSCULAR | Status: DC | PRN
  Administered 2024-06-21: 20 mL

## 2024-06-21 MED ORDER — BUPIVACAINE HCL (PF) 0.25 % IJ SOLN
INTRAMUSCULAR | Status: AC
  Filled 2024-06-21: qty 30

## 2024-06-21 MED ORDER — ACETAMINOPHEN 500 MG PO TABS
1000.0000 mg | ORAL_TABLET | ORAL | Status: AC
  Administered 2024-06-21: 1000 mg via ORAL
  Filled 2024-06-21: qty 2

## 2024-06-21 MED ORDER — OXYCODONE HCL 5 MG PO TABS: 5.0000 mg | ORAL_TABLET | Freq: Once | ORAL | Status: DC | PRN

## 2024-06-21 MED ORDER — MONSELS FERRIC SUBSULFATE EX SOLN
CUTANEOUS | Status: AC
  Filled 2024-06-21: qty 8

## 2024-06-21 MED ORDER — MIDAZOLAM HCL 2 MG/2ML IJ SOLN
INTRAMUSCULAR | Status: AC
  Filled 2024-06-21: qty 2

## 2024-06-21 MED ORDER — EPHEDRINE SULFATE-NACL 50-0.9 MG/10ML-% IV SOSY
PREFILLED_SYRINGE | INTRAVENOUS | Status: DC | PRN
  Administered 2024-06-21 (×2): 5 mg via INTRAVENOUS

## 2024-06-21 MED ORDER — PROPOFOL 1000 MG/100ML IV EMUL
INTRAVENOUS | Status: AC
  Filled 2024-06-21: qty 100

## 2024-06-21 MED ORDER — PHENYLEPHRINE 80 MCG/ML (10ML) SYRINGE FOR IV PUSH (FOR BLOOD PRESSURE SUPPORT)
PREFILLED_SYRINGE | INTRAVENOUS | Status: DC | PRN
  Administered 2024-06-21: 160 ug via INTRAVENOUS
  Administered 2024-06-21: 80 ug via INTRAVENOUS
  Administered 2024-06-21: 160 ug via INTRAVENOUS

## 2024-06-21 SURGICAL SUPPLY — 31 items
BLADE SURG 15 STRL LF DISP TIS (BLADE) ×1 IMPLANT
BNDG GAUZE DERMACEA FLUFF 4 (GAUZE/BANDAGES/DRESSINGS) IMPLANT
BRIEF MESH DISP LRG (UNDERPADS AND DIAPERS) IMPLANT
CATH ROBINSON RED A/P 16FR (CATHETERS) IMPLANT
COVER SURGICAL LIGHT HANDLE (MISCELLANEOUS) ×1 IMPLANT
DRAPE POUCH INSTRU U-SHP 10X18 (DRAPES) ×1 IMPLANT
ELECT PENCIL ROCKER SW 15FT (MISCELLANEOUS) IMPLANT
ELECT REM PT RETURN 15FT ADLT (MISCELLANEOUS) ×1 IMPLANT
GAUZE 4X4 16PLY ~~LOC~~+RFID DBL (SPONGE) IMPLANT
GAUZE PAD ABD 8X10 STRL (GAUZE/BANDAGES/DRESSINGS) IMPLANT
GLOVE BIOGEL PI IND STRL 6.5 (GLOVE) ×1 IMPLANT
GLOVE BIOGEL PI MICRO STRL 6 (GLOVE) ×2 IMPLANT
GOWN STRL REUS W/ TWL LRG LVL3 (GOWN DISPOSABLE) ×1 IMPLANT
KIT TURNOVER KIT A (KITS) ×1 IMPLANT
LEGGING LITHOTOMY PAIR STRL (DRAPES) ×1 IMPLANT
NEEDLE HYPO 22X1.5 SAFETY MO (MISCELLANEOUS) ×1 IMPLANT
PACK PERINEAL COLD (PAD) IMPLANT
PACK VAGINAL MINOR WOMEN LF (CUSTOM PROCEDURE TRAY) ×1 IMPLANT
PAD TELFA 2X3 NADH STRL (GAUZE/BANDAGES/DRESSINGS) IMPLANT
PENCIL SMOKE EVACUATOR (MISCELLANEOUS) ×1 IMPLANT
SCRUB CHG 4% DYNA-HEX 4OZ (MISCELLANEOUS) ×1 IMPLANT
SUT VIC AB 2-0 CT1 18 (SUTURE) ×1 IMPLANT
SUT VIC AB 2-0 CT1 27XBRD (SUTURE) ×1 IMPLANT
SUT VIC AB 2-0 CT1 TAPERPNT 27 (SUTURE) IMPLANT
SUT VIC AB 2-0 SH 27X BRD (SUTURE) IMPLANT
SUT VIC AB 3-0 SH 27XBRD (SUTURE) IMPLANT
SUT VIC AB 4-0 SH 18 (SUTURE) IMPLANT
SUT VIC AB 4-0 SH 27XBRD (SUTURE) IMPLANT
TOWEL OR DSP ST BLU DLX 10/PK (DISPOSABLE) ×1 IMPLANT
TRAY FOLEY MTR SLVR 16FR STAT (SET/KITS/TRAYS/PACK) IMPLANT
YANKAUER SUCT BULB TIP 10FT TU (MISCELLANEOUS) ×1 IMPLANT

## 2024-06-21 NOTE — Anesthesia Procedure Notes (Signed)
 Procedure Name: LMA Insertion Date/Time: 06/21/2024 7:30 AM  Performed by: Judythe Tanda Aran, CRNAPre-anesthesia Checklist: Emergency Drugs available, Patient identified, Suction available and Patient being monitored Patient Re-evaluated:Patient Re-evaluated prior to induction Oxygen Delivery Method: Circle system utilized Preoxygenation: Pre-oxygenation with 100% oxygen Induction Type: IV induction Ventilation: Mask ventilation without difficulty LMA: LMA inserted LMA Size: 4.0 Number of attempts: 1 Placement Confirmation: positive ETCO2 and breath sounds checked- equal and bilateral Tube secured with: Tape Dental Injury: Teeth and Oropharynx as per pre-operative assessment

## 2024-06-21 NOTE — Transfer of Care (Signed)
 Immediate Anesthesia Transfer of Care Note  Patient: Jasmine Fox  Procedure(s) Performed: SIMPLE PARTIAL VULVECTOMY WITH ROTATIONAL FLAP (Vagina )  Patient Location: PACU  Anesthesia Type:General  Level of Consciousness: awake  Airway & Oxygen Therapy: Patient Spontanous Breathing and Patient connected to face mask  Post-op Assessment: Report given to RN and Post -op Vital signs reviewed and stable  Post vital signs: Reviewed and stable  Last Vitals:  Vitals Value Taken Time  BP 106/59 06/21/24 09:25  Temp    Pulse 76 06/21/24 09:28  Resp 17 06/21/24 09:28  SpO2 100 % 06/21/24 09:28  Vitals shown include unfiled device data.  Last Pain:  Vitals:   06/21/24 0554  TempSrc:   PainSc: 0-No pain         Complications: No notable events documented.

## 2024-06-21 NOTE — Interval H&P Note (Signed)
 History and Physical Interval Note:  06/21/2024 7:07 AM  Jasmine Fox  has presented today for surgery, with the diagnosis of Vulvar intraepithelial neoplasia III.  The various methods of treatment have been discussed with the patient and family. After consideration of risks, benefits and other options for treatment, the patient has consented to  Procedures with comments: WIDE EXCISION VULVECTOMY (N/A) - WITH POSSIBLE ROTATIONAL FLAP as a surgical intervention.  The patient's history has been reviewed, patient examined, no change in status, stable for surgery.  I have reviewed the patient's chart and labs.  Questions were answered to the patient's satisfaction.     Trinitie Mcgirr

## 2024-06-21 NOTE — Anesthesia Postprocedure Evaluation (Signed)
"   Anesthesia Post Note  Patient: Jasmine Fox  Procedure(s) Performed: SIMPLE PARTIAL VULVECTOMY WITH ROTATIONAL FLAP (Vagina )     Patient location during evaluation: PACU Anesthesia Type: General Level of consciousness: awake and alert Pain management: pain level controlled Vital Signs Assessment: post-procedure vital signs reviewed and stable Respiratory status: spontaneous breathing, nonlabored ventilation, respiratory function stable and patient connected to nasal cannula oxygen Cardiovascular status: blood pressure returned to baseline and stable Postop Assessment: no apparent nausea or vomiting Anesthetic complications: no   No notable events documented.  Last Vitals:  Vitals:   06/21/24 1000 06/21/24 1013  BP: 104/68 (!) 115/59  Pulse: (!) 58 77  Resp: 15 20  Temp: (!) 36 C 36.7 C  SpO2: 100% 100%    Last Pain:  Vitals:   06/21/24 1013  TempSrc: Oral  PainSc: 0-No pain                 Stanislawa Gaffin L Eber Ferrufino      "

## 2024-06-21 NOTE — Anesthesia Preprocedure Evaluation (Addendum)
"                                    Anesthesia Evaluation  Patient identified by MRN, date of birth, ID band Patient awake    Reviewed: Allergy & Precautions, NPO status , Patient's Chart, lab work & pertinent test results  Airway Mallampati: I  TM Distance: >3 FB Neck ROM: Full    Dental no notable dental hx. (+) Teeth Intact, Dental Advisory Given   Pulmonary former smoker   Pulmonary exam normal breath sounds clear to auscultation       Cardiovascular negative cardio ROS Normal cardiovascular exam Rhythm:Regular Rate:Normal     Neuro/Psych  Headaches  negative psych ROS   GI/Hepatic negative GI ROS, Neg liver ROS,,,  Endo/Other  negative endocrine ROS    Renal/GU negative Renal ROS  negative genitourinary   Musculoskeletal negative musculoskeletal ROS (+)    Abdominal   Peds  Hematology negative hematology ROS (+)   Anesthesia Other Findings   Reproductive/Obstetrics                              Anesthesia Physical Anesthesia Plan  ASA: 2  Anesthesia Plan: General   Post-op Pain Management: Tylenol  PO (pre-op)*   Induction: Intravenous  PONV Risk Score and Plan: 3 and Ondansetron , Dexamethasone  and Midazolam   Airway Management Planned: LMA  Additional Equipment:   Intra-op Plan:   Post-operative Plan: Extubation in OR  Informed Consent: I have reviewed the patients History and Physical, chart, labs and discussed the procedure including the risks, benefits and alternatives for the proposed anesthesia with the patient or authorized representative who has indicated his/her understanding and acceptance.     Dental advisory given  Plan Discussed with: CRNA  Anesthesia Plan Comments:          Anesthesia Quick Evaluation  "

## 2024-06-21 NOTE — Discharge Instructions (Signed)
 06/21/2024  Return to work: 2-4 weeks if applicable  Activity: 1. Be up and out of the bed during the day.  Take a nap if needed.  You may walk up steps but be careful and use the hand rail.  Stair climbing will tire you more than you think, you may need to stop part way and rest.   2. No lifting or straining over 10 lbs, pushing, pulling, straining for 6 weeks.  3. Do not drive if you are taking narcotic pain medicine. You need to make sure your reaction time has returned and you can brake safely.  4. Shower daily.  Use your regular soap to bathe and when finished pat your incision dry; don't rub.  No tub baths until cleared by your surgeon.   5. No sexual activity and nothing in the vagina for 4 weeks.  6. You may experience a small amount of clear drainage from your incisions, which is normal.  If the drainage persists or increases, please call the office.  7. You may experience vaginal spotting after surgery or around the 6-8 week mark from surgery when the stitches at the top of the vagina begin to dissolve.  The spotting is normal but if you experience heavy bleeding, call our office.  8. Take Tylenol  or ibuprofen first for pain and only use narcotic pain medication for severe pain not relieved by the Tylenol  or Ibuprofen.  Monitor your Tylenol  intake to a max of 4,000 mg.   Diet: 1. Low sodium Heart Healthy Diet is recommended.  2. It is safe to use a laxative, such as Miralax  or Colace, if you have difficulty moving your bowels. You can take Sennakot at bedtime every evening to keep bowel movements regular and to prevent constipation.    Wound Care: 1. Keep clean and dry.  Shower daily.  Reasons to call the Doctor: Fever - Oral temperature greater than 100.4 degrees Fahrenheit Foul-smelling vaginal discharge Difficulty urinating Nausea and vomiting Increased pain at the site of the incision that is unrelieved with pain medicine. Difficulty breathing with or without chest  pain New calf pain especially if only on one side Sudden, continuing increased vaginal bleeding with or without clots.   Contacts: For questions or concerns you should contact:  Dr. Eldonna at [phone]  Eleanor Epps, NP at [phone]  After Hours: call [phone] and have the GYN Oncologist paged/contacted

## 2024-06-21 NOTE — Op Note (Signed)
 GYNECOLOGIC ONCOLOGY OPERATIVE NOTE  Date of Service: 06/21/2024  Preoperative Diagnosis: Vulvar intraepithelial neoplasia III  Postoperative Diagnosis: Same  Procedures: Simple partial vulvectomy with bilateral rhomboid rotational flaps  Surgeon: Hoy Masters, MD  Assistants: None  Anesthesia: General  Estimated Blood Loss: 10 mL    Fluids: 900 ml, crystalloid  Urine Output: 300 ml, clear yellow  Findings: Raised white lesion on the perineal body. Acetowhite changes of this lesion and immediate adjacent area covering the majority of the perineal body and approaching the introitus. Defect following removal measures approximately 6x4cm. Bilateral rhomboid rotational flaps created to fill defect, reapproximates well with no tension.    Specimens:  ID Type Source Tests Collected by Time Destination  1 : PERINEAL BODY LONG STITCH AT VAGINAL MARGIN/12OCLOCK, SHORT STITCH AT ANAL MARGIN Tissue PATH Soft tissue SURGICAL PATHOLOGY Masters Hoy, MD 06/21/2024 [phone]     Complications:  None  Indications for Procedure: Jasmine Fox is a 63 y.o. woman with biopsy proven VIN3 on the vulva.  Prior to the procedure, all risks, benefits, and alternatives were discussed and informed surgical consent was signed.  Procedure: Patient was taken to the operating room where general anesthesia was achieved.  She was positioned in dorsal lithotomy and prepped and draped.  A foley catheter was inserted into the bladder.  A thorough exam of the vulva was done after placing a moist sponge with acetic acid  on the vulva with the findings as noted above. The area to be removed was outlined with a marking pen. A scalpel was used to incise around the lesion. Allis clamps are used to grasp the lesion and a skinning vulvectomy was performed in the standard fashion with aid of cautery. After the lesion was removed, the wound bed was made hemostatic with cautery.   Given the size and location of the defect,  decision made to create bilateral rhomboid rotational flaps to fill the defect in the midline. The planned incision lines were marked on the bilateral gluteus. The area was incised with a scalpel. Using electrocautery, the flaps were undermined and rotated toward the midline, approximating well with no tension. Hemostasis was achieved with electrocautery. Several deep 2-0 vicryl stitches were placed to secure the flaps in the midline and reapproximate the donor sites. The skin was then further reapproximated with several interrupted stitches of 3-0 vicrul. The skin was closed several mattress stitches of 4-0 vicryl.  Hemostasis was noted at the end of the procedure. Foley catheter was removed.  Patient tolerated the procedure well. Sponge, lap, and instrument counts were correct.  No perioperative antibiotics were indicated for this procedure.  Patient was extubated and taken to the PACU in stable condition.  Hoy Masters, MD Gynecologic Oncology

## 2024-06-22 ENCOUNTER — Encounter (HOSPITAL_COMMUNITY): Payer: Self-pay | Admitting: Psychiatry

## 2024-06-22 ENCOUNTER — Telehealth: Payer: Self-pay | Admitting: *Deleted

## 2024-06-22 NOTE — Telephone Encounter (Signed)
 Spoke with Jasmine Fox this morning. She states she is eating, drinking and urinating well. She has not had a BM yet but is passing gas. She is taking senokot as prescribed and encouraged her to drink plenty of water . She denies fever or chills. Incisions are dry and intact. She rates her pain 2/10. Her pain is controlled with tylenol .    Instructed to call office with any fever, chills, purulent drainage, uncontrolled pain or any other questions or concerns. Patient verbalizes understanding.   Pt aware of post op appointments as well as the office number [phone] and after hours number [phone] to call if she has any questions or concerns

## 2024-06-24 LAB — SURGICAL PATHOLOGY

## 2024-06-27 ENCOUNTER — Inpatient Hospital Stay

## 2024-06-27 ENCOUNTER — Encounter (HOSPITAL_COMMUNITY): Payer: Self-pay

## 2024-06-27 ENCOUNTER — Inpatient Hospital Stay: Attending: Psychiatry | Admitting: Gynecologic Oncology

## 2024-06-27 ENCOUNTER — Encounter (HOSPITAL_COMMUNITY): Payer: Self-pay | Admitting: Gynecologic Oncology

## 2024-06-27 ENCOUNTER — Inpatient Hospital Stay (HOSPITAL_COMMUNITY)

## 2024-06-27 ENCOUNTER — Inpatient Hospital Stay (HOSPITAL_COMMUNITY)
Admission: AD | Admit: 2024-06-27 | Discharge: 2024-06-30 | DRG: 857 | Disposition: A | Discharge status: Home / Self Care | Attending: Gynecologic Oncology | Admitting: Gynecologic Oncology

## 2024-06-27 ENCOUNTER — Telehealth: Payer: Self-pay

## 2024-06-27 ENCOUNTER — Other Ambulatory Visit: Payer: Self-pay

## 2024-06-27 VITALS — BP 119/70 | HR 88 | Temp 98.4°F | Resp 18 | Wt 109.0 lb

## 2024-06-27 DIAGNOSIS — Z8719 Personal history of other diseases of the digestive system: Secondary | ICD-10-CM

## 2024-06-27 DIAGNOSIS — F32A Depression, unspecified: Secondary | ICD-10-CM | POA: Diagnosis present

## 2024-06-27 DIAGNOSIS — D071 Carcinoma in situ of vulva: Secondary | ICD-10-CM | POA: Diagnosis present

## 2024-06-27 DIAGNOSIS — Z87442 Personal history of urinary calculi: Secondary | ICD-10-CM

## 2024-06-27 DIAGNOSIS — Z22322 Carrier or suspected carrier of Methicillin resistant Staphylococcus aureus: Secondary | ICD-10-CM

## 2024-06-27 DIAGNOSIS — T8149XA Infection following a procedure, other surgical site, initial encounter: Secondary | ICD-10-CM

## 2024-06-27 DIAGNOSIS — Z9842 Cataract extraction status, left eye: Secondary | ICD-10-CM

## 2024-06-27 DIAGNOSIS — T8141XA Infection following a procedure, superficial incisional surgical site, initial encounter: Principal | ICD-10-CM | POA: Diagnosis present

## 2024-06-27 DIAGNOSIS — Y838 Other surgical procedures as the cause of abnormal reaction of the patient, or of later complication, without mention of misadventure at the time of the procedure: Secondary | ICD-10-CM | POA: Diagnosis present

## 2024-06-27 DIAGNOSIS — Z87891 Personal history of nicotine dependence: Secondary | ICD-10-CM

## 2024-06-27 DIAGNOSIS — Z8249 Family history of ischemic heart disease and other diseases of the circulatory system: Secondary | ICD-10-CM

## 2024-06-27 DIAGNOSIS — N764 Abscess of vulva: Secondary | ICD-10-CM | POA: Diagnosis present

## 2024-06-27 DIAGNOSIS — R110 Nausea: Secondary | ICD-10-CM | POA: Insufficient documentation

## 2024-06-27 DIAGNOSIS — Z9079 Acquired absence of other genital organ(s): Secondary | ICD-10-CM

## 2024-06-27 DIAGNOSIS — T8140XA Infection following a procedure, unspecified, initial encounter: Secondary | ICD-10-CM | POA: Diagnosis present

## 2024-06-27 LAB — CMP (CANCER CENTER ONLY)
ALT: 12 U/L (ref 0–44)
AST: 17 U/L (ref 15–41)
Albumin: 4.3 g/dL (ref 3.5–5.0)
Alkaline Phosphatase: 65 U/L (ref 38–126)
Anion gap: 14 (ref 5–15)
BUN: 14 mg/dL (ref 8–23)
CO2: 23 mmol/L (ref 22–32)
Calcium: 9.6 mg/dL (ref 8.9–10.3)
Chloride: 101 mmol/L (ref 98–111)
Creatinine: 0.73 mg/dL (ref 0.44–1.00)
GFR, Estimated: >60 mL/min
Glucose, Bld: 110 mg/dL — ABNORMAL HIGH (ref 70–99)
Potassium: 4.2 mmol/L (ref 3.5–5.1)
Sodium: 138 mmol/L (ref 135–145)
Total Bilirubin: 0.4 mg/dL (ref 0.0–1.2)
Total Protein: 7.7 g/dL (ref 6.5–8.1)

## 2024-06-27 LAB — CBC WITH DIFFERENTIAL (CANCER CENTER ONLY)
Abs Immature Granulocytes: 0.13 K/uL — ABNORMAL HIGH (ref 0.00–0.07)
Basophils Absolute: 0 K/uL (ref 0.0–0.1)
Basophils Relative: 0 %
Eosinophils Absolute: 0.1 K/uL (ref 0.0–0.5)
Eosinophils Relative: 1 %
HCT: 35.7 % — ABNORMAL LOW (ref 36.0–46.0)
Hemoglobin: 11.9 g/dL — ABNORMAL LOW (ref 12.0–15.0)
Immature Granulocytes: 1 %
Lymphocytes Relative: 14 %
Lymphs Abs: 1.6 K/uL (ref 0.7–4.0)
MCH: 28 pg (ref 26.0–34.0)
MCHC: 33.3 g/dL (ref 30.0–36.0)
MCV: 84 fL (ref 80.0–100.0)
Monocytes Absolute: 0.9 K/uL (ref 0.1–1.0)
Monocytes Relative: 8 %
Neutro Abs: 8.2 K/uL — ABNORMAL HIGH (ref 1.7–7.7)
Neutrophils Relative %: 76 %
Platelet Count: 301 K/uL (ref 150–400)
RBC: 4.25 MIL/uL (ref 3.87–5.11)
RDW: 13.8 % (ref 11.5–15.5)
WBC Count: 10.9 K/uL — ABNORMAL HIGH (ref 4.0–10.5)
nRBC: 0 % (ref 0.0–0.2)

## 2024-06-27 LAB — SURGICAL PCR SCREEN
MRSA, PCR: POSITIVE — AB
Staphylococcus aureus: POSITIVE — AB

## 2024-06-27 MED ORDER — SODIUM CHLORIDE 0.9 % IV SOLN: INTRAVENOUS | Status: DC

## 2024-06-27 MED ORDER — METRONIDAZOLE 500 MG/100ML IV SOLN
500.0000 mg | Freq: Two times a day (BID) | INTRAVENOUS | Status: DC
  Administered 2024-06-27: 500 mg via INTRAVENOUS
  Filled 2024-06-27: qty 100

## 2024-06-27 MED ORDER — VANCOMYCIN HCL IN DEXTROSE 1-5 GM/200ML-% IV SOLN
1000.0000 mg | Freq: Once | INTRAVENOUS | Status: AC
  Administered 2024-06-27: 1000 mg via INTRAVENOUS
  Filled 2024-06-27: qty 200

## 2024-06-27 MED ORDER — SODIUM CHLORIDE 0.9 % IV SOLN
2.0000 g | Freq: Two times a day (BID) | INTRAVENOUS | Status: DC
  Administered 2024-06-27: 2 g via INTRAVENOUS
  Filled 2024-06-27 (×2): qty 12.5

## 2024-06-27 MED ORDER — PROPYLENE GLYCOL 0.6 % OP SOLN: 1.0000 | Freq: Four times a day (QID) | OPHTHALMIC | Status: DC | PRN

## 2024-06-27 MED ORDER — TRAMADOL HCL 50 MG PO TABS
50.0000 mg | ORAL_TABLET | Freq: Four times a day (QID) | ORAL | Status: DC | PRN
  Administered 2024-06-29: 50 mg via ORAL
  Filled 2024-06-27: qty 1

## 2024-06-27 MED ORDER — ONDANSETRON HCL 4 MG PO TABS
4.0000 mg | ORAL_TABLET | Freq: Four times a day (QID) | ORAL | Status: DC | PRN
  Administered 2024-06-30: 4 mg via ORAL
  Filled 2024-06-27: qty 1

## 2024-06-27 MED ORDER — OXYCODONE-ACETAMINOPHEN 5-325 MG PO TABS: 1.0000 | ORAL_TABLET | ORAL | Status: DC | PRN

## 2024-06-27 MED ORDER — POLYVINYL ALCOHOL 1.4 % OP SOLN: 1.0000 [drp] | Freq: Four times a day (QID) | OPHTHALMIC | Status: DC | PRN

## 2024-06-27 MED ORDER — VANCOMYCIN HCL IN DEXTROSE 1-5 GM/200ML-% IV SOLN
1000.0000 mg | INTRAVENOUS | Status: DC
  Administered 2024-06-29: 1000 mg via INTRAVENOUS
  Filled 2024-06-27 (×2): qty 200

## 2024-06-27 MED ORDER — METRONIDAZOLE 500 MG/100ML IV SOLN
500.0000 mg | Freq: Two times a day (BID) | INTRAVENOUS | Status: DC
  Administered 2024-06-28: 500 mg via INTRAVENOUS
  Filled 2024-06-27: qty 100

## 2024-06-27 MED ORDER — OXYMETAZOLINE HCL 0.05 % NA SOLN: 1.0000 | Freq: Two times a day (BID) | NASAL | Status: DC | PRN

## 2024-06-27 MED ORDER — IOHEXOL 350 MG/ML SOLN
75.0000 mL | Freq: Once | INTRAVENOUS | Status: AC | PRN
  Administered 2024-06-27: 75 mL via INTRAVENOUS

## 2024-06-27 MED ORDER — OXYCODONE HCL 5 MG PO TABS: 5.0000 mg | ORAL_TABLET | ORAL | Status: DC | PRN

## 2024-06-27 MED ORDER — ACETAMINOPHEN 325 MG PO TABS
650.0000 mg | ORAL_TABLET | ORAL | Status: DC | PRN
  Administered 2024-06-27 – 2024-06-29 (×2): 650 mg via ORAL
  Filled 2024-06-27 (×2): qty 2

## 2024-06-27 MED ORDER — ONDANSETRON HCL 4 MG/2ML IJ SOLN
4.0000 mg | Freq: Four times a day (QID) | INTRAMUSCULAR | Status: DC | PRN
  Administered 2024-06-28 – 2024-06-29 (×2): 4 mg via INTRAVENOUS
  Filled 2024-06-27 (×2): qty 2

## 2024-06-27 MED ORDER — SODIUM CHLORIDE 0.9 % IV SOLN
2.0000 g | Freq: Two times a day (BID) | INTRAVENOUS | Status: DC
  Administered 2024-06-28: 2 g via INTRAVENOUS
  Filled 2024-06-27 (×3): qty 12.5

## 2024-06-27 MED ORDER — IOHEXOL 9 MG/ML PO SOLN
500.0000 mL | ORAL | Status: AC
  Administered 2024-06-27 (×2): 500 mL via ORAL

## 2024-06-27 NOTE — Telephone Encounter (Signed)
 S/P Simple partial vulvectomy with bilateral rhomboid rotational flaps on 06/21/24 with Dr.Newton  Pt called with a concern of drainage from incision site. This started on Sunday, a yellowish drainage, (maybe coming from both sides but hard to tell) she feels like there is a slight odor.  This morning she has about a nickel size spot of bright red blood on her pad, reports no clots. Pain is about 3-4/10 on pain scale relieved with Tylenol . She is still using the peri-bottle and pat drying. No pelvic/abdominal pain, nothing has been inserted in the vagina. No UTI S&S (pain, pressure, burning, frequency) No fever/chills. She is not constipated so no straining. Eating and drinking fine, reports a little nausea over the weekend but no longer this AM. She has been wearing loose fitting underwear.  Unable to take a picture, she is alone at home.  Aware message will be sent to provider and office will call her back

## 2024-06-27 NOTE — Plan of Care (Signed)
   Problem: Health Behavior/Discharge Planning: Goal: Ability to manage health-related needs will improve Outcome: Progressing   Problem: Clinical Measurements: Goal: Ability to maintain clinical measurements within normal limits will improve Outcome: Progressing

## 2024-06-27 NOTE — H&P (Signed)
 Gynecologic Oncology H&P  Jasmine Fox 64 y.o. female  CC: Vulvar incisional drainage with foul odor  HPI: Jasmine Fox is a 64 year old female who was initially seen in consultation at the request of Dr. 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   Patient presented to OB/GYN on 04/12/2024 for routine exam.  At that time, she was noted on exam to have a thick white plaque on the vulva.  She was recommended to return for biopsy.  A punch biopsy was performed on 05/03/2024 which returned with squamous cell carcinoma in situ, peripheral margins involved.   At her consultation with Dr. Eldonna, the patient endorsed the history as above.  Reported that she had some itching in the area several years ago and was evaluated.  She reported she was prescribed cream at that time.  She felt that the area has gotten bigger and more itchy in the past year and occasionally bleeds.  Denied any other areas of abnormality that she was aware of.  Otherwise denies abdominal bloating, early satiety, significant weight loss, change in bowel or bladder habits.    She is s/p simple partial vulvectomy with bilateral rhomboid rotational flaps on 06/21/2024 with Dr. Eldonna for VIN 3. Final pathology returned with: A. PERINEAL BODY, VULVECTOMY:  - High-grade squamous intraepithelial lesion (VIN 3), focally extending  to the 3:00 tip.  - All other margins negative for dysplasia.  - Negative for invasive carcinoma.   Interval History: She presented to the office today with complaints of incisional drainage with an odor, diarrhea, difficulty sleeping, nausea. She reports not sleeping well for the past 2 nights. She is concerned she may not be cleansing the area as well as she would like. On Saturday, she noticed a mild odor with minimal clear discharge from the vulvar incision but this has progressed. The odor was become more foul, drainage noted as brown and cloudy. She had nausea  intermittently on Saturday, all day Sunday and today. She has also started having diarrhea that started on Friday. She has had 6 episodes of loose stools today. The last laxative she took was on Wednesday. No fever or chills. Denies respiratory issues. Emptying bladder without difficulty. No significant vulvar pain reported. Having some irritation that improves with walking more.   Current Meds:  Outpatient Encounter Medications as of 06/27/2024  Medication Sig   oxymetazoline  (AFRIN) 0.05 % nasal spray Place 1 spray into both nostrils 2 (two) times daily as needed for congestion.   Propylene Glycol (SYSTANE COMPLETE) 0.6 % SOLN Place 1 spray into both eyes 4 (four) times daily as needed (dry/irritated eyes.).   traMADol  (ULTRAM ) 50 MG tablet Take 1 tablet (50 mg total) by mouth every 6 (six) hours as needed for moderate pain (pain score 4-6). For AFTER surgery only, do not take and drive   [DISCONTINUED] senna-docusate (SENOKOT-S) 8.6-50 MG tablet Take 2 tablets by mouth at bedtime. For AFTER surgery, do not take if having diarrhea   No facility-administered encounter medications on file as of 06/27/2024.    Allergy: NKA  Social Hx:   Social History   Socioeconomic History   Marital status: Married    Spouse name: Not on file   Number of children: 0   Years of education: Not on file   Highest education level: Not on file  Occupational History    Employer: [employer]  Tobacco Use   Smoking status: Former   Smokeless tobacco: Never  Tobacco comments:    Only about 2 packs cigarettes in lifetime  Vaping Use   Vaping status: Never Used  Substance and Sexual Activity   Alcohol  use: No    Alcohol /week: 0.0 standard drinks of alcohol    Drug use: No   Sexual activity: Not Currently    Birth control/protection: Post-menopausal  Other Topics Concern   Not on file  Social History Narrative   Not on file   Social Drivers of Health   Tobacco Use: Medium Risk (06/21/2024)   Patient History     Smoking Tobacco Use: Former    Smokeless Tobacco Use: Never    Passive Exposure: Not on file  Financial Resource Strain: Low Risk (04/12/2024)   Overall Financial Resource Strain (CARDIA)    Difficulty of Paying Living Expenses: Not hard at all  Food Insecurity: No Food Insecurity (05/23/2024)   Epic    Worried About Radiation Protection Practitioner of Food in the Last Year: Never true    Ran Out of Food in the Last Year: Never true  Transportation Needs: No Transportation Needs (05/23/2024)   Epic    Lack of Transportation (Medical): No    Lack of Transportation (Non-Medical): No  Physical Activity: Sufficiently Active (04/12/2024)   Exercise Vital Sign    Days of Exercise per Week: 4 days    Minutes of Exercise per Session: 60 min  Stress: No Stress Concern Present (04/12/2024)   [employer] of Occupational Health - Occupational Stress Questionnaire    Feeling of Stress: Not at all  Social Connections: Moderately Isolated (04/12/2024)   Social Connection and Isolation Panel    Frequency of Communication with Friends and Family: More than three times a week    Frequency of Social Gatherings with Friends and Family: Once a week    Attends Religious Services: Never    Database Administrator or Organizations: No    Attends Banker Meetings: Never    Marital Status: Married  Catering Manager Violence: Not At Risk (05/23/2024)   Epic    Fear of Current or Ex-Partner: No    Emotionally Abused: No    Physically Abused: No    Sexually Abused: No  Depression (PHQ2-9): Low Risk (05/23/2024)   Depression (PHQ2-9)    PHQ-2 Score: 0  Alcohol  Screen: Not on file  Housing: Low Risk (05/23/2024)   Epic    Unable to Pay for Housing in the Last Year: No    Number of Times Moved in the Last Year: 0    Homeless in the Last Year: No  Utilities: Not At Risk (05/23/2024)   Epic    Threatened with loss of utilities: No  Health Literacy: Not on file    Past Surgical Hx:  Past Surgical History:   Procedure Laterality Date   BREAST BIOPSY Left 08/23/2008   DUCTAL PAPILLOMA WITH CALCIFICATIONS/ FIBROCYSTIC CHANGES AND CALCIFICATIONS   CATARACT EXTRACTION Left 07/27/2018   COLONOSCOPY N/A 11/10/2012   Procedure: COLONOSCOPY;  Surgeon: Lamar CHRISTELLA Hollingshead, MD;  Location: [facility];  Service: Endoscopy;  Laterality: N/A;  11:15   COLONOSCOPY WITH PROPOFOL  N/A 03/09/2023   Procedure: COLONOSCOPY WITH PROPOFOL ;  Surgeon: Hollingshead Lamar CHRISTELLA, MD;  Location: [facility];  Service: Endoscopy;  Laterality: N/A;  9:30 AM, ASA 2   ESOPHAGOGASTRODUODENOSCOPY  02/18/2002   MFM:Wnmfjo esophagus/s/p 54 French Maloney dilator   KIDNEY STONE SURGERY     lt bunion surgery     PARS PLANA VITRECTOMY W/REMOVE OF INTERNAL LIMITING  MEMBRANE W/TAMPONADE Right 11/19/2023   Procedure: PARS PLANA VITRECTOMY WITH 25 GAUGE WITH REMOVAL OF INTERNAL LIMITING MEMBRANE OF RETINA WITH INTRAOCULAR TAMPONADE;  Surgeon: Valdemar Rogue, MD;  Location[facility];  Service: Ophthalmology;  Laterality: Right;  C3F8 gas insertion   POLYPECTOMY  03/09/2023   Procedure: POLYPECTOMY INTESTINAL;  Surgeon: Shaaron Lamar HERO, MD;  Location: [facility];  Service: Endoscopy;;   VULVECTOMY N/A 06/21/2024   Procedure: SIMPLE PARTIAL VULVECTOMY WITH ROTATIONAL FLAP;  Surgeon: Eldonna Mays, MD;  Location: [facility];  Service: Gynecology;  Laterality: N/A;  WITH POSSIBLE ROTATIONAL FLAP    Past Medical Hx:  Past Medical History:  Diagnosis Date   Complication of anesthesia    Constipation    Migraine    none since 2013   Urolithiasis     Family Hx:  Family History  Problem Relation Age of Onset   Heart disease Mother        congestive heart failure   Diabetes Father    Hypertension Father    Colonic polyp Father    Brain cancer Brother    Hypertension Brother    Colon cancer Neg Hx    Breast cancer Neg Hx    Ovarian cancer Neg Hx    Endometrial cancer Neg Hx    Pancreatic cancer Neg Hx    Prostate cancer Neg Hx      Vitals:  There were no vitals taken for this visit.  CBC    Component Value Date/Time   WBC 10.9 (H) 06/27/2024 1349   WBC 5.9 06/10/2024 0828   RBC 4.25 06/27/2024 1349   HGB 11.9 (L) 06/27/2024 1349   HCT 35.7 (L) 06/27/2024 1349   PLT 301 06/27/2024 1349   MCV 84.0 06/27/2024 1349   MCH 28.0 06/27/2024 1349   MCHC 33.3 06/27/2024 1349   RDW 13.8 06/27/2024 1349   LYMPHSABS 1.6 06/27/2024 1349   MONOABS 0.9 06/27/2024 1349   EOSABS 0.1 06/27/2024 1349   BASOSABS 0.0 06/27/2024 1349   BMET    Component Value Date/Time   NA 138 06/27/2024 1349   K 4.2 06/27/2024 1349   CL 101 06/27/2024 1349   CO2 23 06/27/2024 1349   GLUCOSE 110 (H) 06/27/2024 1349   BUN 14 06/27/2024 1349   CREATININE 0.73 06/27/2024 1349   CALCIUM 9.6 06/27/2024 1349   GFRNONAA >60 06/27/2024 1349    Physical Exam:  In office, T 98.4, 100 % on RA, 119/70, P88, R 18. General: Well developed, well nourished female in no acute distress. Alert and oriented x 3. Non-toxic appearing. Cardiovascular: Regular rate and rhythm. S1 and S2 normal.  Lungs: Clear to auscultation bilaterally. No wheezes/crackles/rhonchi noted.  Abdomen: Abdomen soft, non-tender and non-obese. Active bowel sounds in all quadrants.  Genitourinary:    See media for photo. 6 cm of erythema and induration noted on the right vulva and 8 cm noted on the left. With palpation of the indurated area on the right, copious brown, foul smelling, cloudy drainage is expressed coming from the aspect of the incision closest to the introitus. Increased warmth of this tissue. Skin blanching. Area is tender on palpation but not severely per pt. Extremities: No bilateral cyanosis, edema, or clubbing.    Assessment/Plan: 64 year old female s/p simple partial vulvectomy with bilateral rhomboid rotational flap for VIN 3 presenting to the office today with suspicion for vulvar incisional infection. Situation discussed with Dr. Viktoria, on call provider.  Lab work obtained. Reviewed lab work with  patient. Given elevated white count and vulvar findings, plan for admission for IV antibiotics. Culture obtained during office examination. Plan for Dr. Eldonna to evaluate the vulva in the am to see if incisional opening with drainage is needed. Pharmacy consulted for IV antibiotic management. CT scan will be ordered to further evaluate for drainable collection.   Eleanor JONETTA Epps, NP 06/27/2024, 3:17 PM

## 2024-06-27 NOTE — Plan of Care (Signed)

## 2024-06-27 NOTE — Telephone Encounter (Signed)
 Jasmine Fox states she did notice a little discharge this morning along with the spotting.   She is scheduled for an appointment with Eleanor Epps NP today at 1:00

## 2024-06-27 NOTE — Progress Notes (Signed)
 Pharmacy Antibiotic Note  Jasmine Fox is a 64 y.o. female admitted on 06/27/2024 with post-op infection.  Pharmacy has been consulted for vancomycin  dosing. Pt presented to office  s/p simple partial vulvectomy with bilateral rhomboid rotational flap for VIN 3 presenting to the office today with suspicion for vulvar incisional infection.  Starting broad coverage.   Plan: Vancomycin  1000 mg IV q24h ( AUC 551, Scr rounded up to 0.8, Vd 0.72, wt 49.4 kg )  Cefepime  2 gr IV q12h  Metronidazole  500 mg IV q12h      Temp (24hrs), Avg:98.4 F (36.9 C), Min:98.4 F (36.9 C), Max:98.4 F (36.9 C)  Recent Labs  Lab 06/27/24 1349  WBC 10.9*  CREATININE 0.73    Estimated Creatinine Clearance: 56.1 mL/min (by C-G formula based on SCr of 0.73 mg/dL).    Allergies[1]  Antimicrobials this admission: 1/5 metronidazole  >>  1/5 vancomycin  >>  1/5 cefepime  >>   Dose adjustments this admission:   Microbiology results: 1/5 surgical site culture:    Clyde Upshaw, PharmD, BCPS 06/27/2024 3:34 PM     [1] No Known Allergies

## 2024-06-27 NOTE — Progress Notes (Signed)
 Gynecologic Oncology Symptom Management Visit  Jasmine Fox 64 y.o. female  CC: Vulvar incisional drainage with foul odor  HPI: Jasmine Fox is a 64 year old female who was initially seen in consultation at the request of Dr. 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   Patient presented to OB/GYN on 04/12/2024 for routine exam.  At that time, she was noted on exam to have a thick white plaque on the vulva.  She was recommended to return for biopsy.  A punch biopsy was performed on 05/03/2024 which returned with squamous cell carcinoma in situ, peripheral margins involved.   At her consultation with Dr. Eldonna, the patient endorsed the history as above.  Reported that she had some itching in the area several years ago and was evaluated.  She reported she was prescribed cream at that time.  She felt that the area has gotten bigger and more itchy in the past year and occasionally bleeds.  Denied any other areas of abnormality that she was aware of.  Otherwise denies abdominal bloating, early satiety, significant weight loss, change in bowel or bladder habits.    She is s/p simple partial vulvectomy with bilateral rhomboid rotational flaps on 06/21/2024 with Dr. Eldonna for VIN 3. Final pathology returned with: A. PERINEAL BODY, VULVECTOMY:  - High-grade squamous intraepithelial lesion (VIN 3), focally extending  to the 3:00 tip.  - All other margins negative for dysplasia.  - Negative for invasive carcinoma.   Interval History: She presents to the office today with complaints of incisional drainage with an odor, diarrhea, difficulty sleeping, nausea. She reports not sleeping well for the past 2 nights. She is concerned she may not be cleansing the area as well as she would like. On Saturday, she noticed a mild odor with minimal clear discharge from the vulvar incision but this has progressed. The odor was become more foul, drainage noted as brown and  cloudy. She had nausea intermittently on Saturday, all day Sunday and today. She has also started having diarrhea that started on Friday. She has had 6 episodes of loose stools today. The last laxative she took was on Wednesday. No fever or chills. Denies respiratory issues. Emptying bladder without difficulty.   Current Meds:  Outpatient Encounter Medications as of 06/27/2024  Medication Sig   oxymetazoline  (AFRIN) 0.05 % nasal spray Place 1 spray into both nostrils 2 (two) times daily as needed for congestion.   Propylene Glycol (SYSTANE COMPLETE) 0.6 % SOLN Place 1 spray into both eyes 4 (four) times daily as needed (dry/irritated eyes.).   traMADol  (ULTRAM ) 50 MG tablet Take 1 tablet (50 mg total) by mouth every 6 (six) hours as needed for moderate pain (pain score 4-6). For AFTER surgery only, do not take and drive   [DISCONTINUED] senna-docusate (SENOKOT-S) 8.6-50 MG tablet Take 2 tablets by mouth at bedtime. For AFTER surgery, do not take if having diarrhea   No facility-administered encounter medications on file as of 06/27/2024.    Allergy: NKA  Social Hx:   Social History   Socioeconomic History   Marital status: Married    Spouse name: Not on file   Number of children: 0   Years of education: Not on file   Highest education level: Not on file  Occupational History    Employer: [employer]  Tobacco Use   Smoking status: Former   Smokeless tobacco: Never   Tobacco comments:    Only about 2 packs cigarettes  in lifetime  Vaping Use   Vaping status: Never Used  Substance and Sexual Activity   Alcohol  use: No    Alcohol /week: 0.0 standard drinks of alcohol    Drug use: No   Sexual activity: Not Currently    Birth control/protection: Post-menopausal  Other Topics Concern   Not on file  Social History Narrative   Not on file   Social Drivers of Health   Tobacco Use: Medium Risk (06/21/2024)   Patient History    Smoking Tobacco Use: Former    Smokeless Tobacco Use: Never     Passive Exposure: Not on file  Financial Resource Strain: Low Risk (04/12/2024)   Overall Financial Resource Strain (CARDIA)    Difficulty of Paying Living Expenses: Not hard at all  Food Insecurity: No Food Insecurity (05/23/2024)   Epic    Worried About Radiation Protection Practitioner of Food in the Last Year: Never true    Ran Out of Food in the Last Year: Never true  Transportation Needs: No Transportation Needs (05/23/2024)   Epic    Lack of Transportation (Medical): No    Lack of Transportation (Non-Medical): No  Physical Activity: Sufficiently Active (04/12/2024)   Exercise Vital Sign    Days of Exercise per Week: 4 days    Minutes of Exercise per Session: 60 min  Stress: No Stress Concern Present (04/12/2024)   [employer] of Occupational Health - Occupational Stress Questionnaire    Feeling of Stress: Not at all  Social Connections: Moderately Isolated (04/12/2024)   Social Connection and Isolation Panel    Frequency of Communication with Friends and Family: More than three times a week    Frequency of Social Gatherings with Friends and Family: Once a week    Attends Religious Services: Never    Database Administrator or Organizations: No    Attends Banker Meetings: Never    Marital Status: Married  Catering Manager Violence: Not At Risk (05/23/2024)   Epic    Fear of Current or Ex-Partner: No    Emotionally Abused: No    Physically Abused: No    Sexually Abused: No  Depression (PHQ2-9): Low Risk (05/23/2024)   Depression (PHQ2-9)    PHQ-2 Score: 0  Alcohol  Screen: Not on file  Housing: Low Risk (05/23/2024)   Epic    Unable to Pay for Housing in the Last Year: No    Number of Times Moved in the Last Year: 0    Homeless in the Last Year: No  Utilities: Not At Risk (05/23/2024)   Epic    Threatened with loss of utilities: No  Health Literacy: Not on file    Past Surgical Hx:  Past Surgical History:  Procedure Laterality Date   BREAST BIOPSY Left 08/23/2008    DUCTAL PAPILLOMA WITH CALCIFICATIONS/ FIBROCYSTIC CHANGES AND CALCIFICATIONS   CATARACT EXTRACTION Left 07/27/2018   COLONOSCOPY N/A 11/10/2012   Procedure: COLONOSCOPY;  Surgeon: Lamar CHRISTELLA Hollingshead, MD;  Location: [facility];  Service: Endoscopy;  Laterality: N/A;  11:15   COLONOSCOPY WITH PROPOFOL  N/A 03/09/2023   Procedure: COLONOSCOPY WITH PROPOFOL ;  Surgeon: Hollingshead Lamar CHRISTELLA, MD;  Location: [facility];  Service: Endoscopy;  Laterality: N/A;  9:30 AM, ASA 2   ESOPHAGOGASTRODUODENOSCOPY  02/18/2002   MFM:Wnmfjo esophagus/s/p 54 French Maloney dilator   KIDNEY STONE SURGERY     lt bunion surgery     PARS PLANA VITRECTOMY W/REMOVE OF INTERNAL LIMITING MEMBRANE W/TAMPONADE Right 11/19/2023   Procedure: PARS PLANA VITRECTOMY  WITH 25 GAUGE WITH REMOVAL OF INTERNAL LIMITING MEMBRANE OF RETINA WITH INTRAOCULAR TAMPONADE;  Surgeon: Valdemar Rogue, MD;  Location: [facility] OR;  Service: Ophthalmology;  Laterality: Right;  C3F8 gas insertion   POLYPECTOMY  03/09/2023   Procedure: POLYPECTOMY INTESTINAL;  Surgeon: Shaaron Lamar HERO, MD;  Location: [facility];  Service: Endoscopy;;   VULVECTOMY N/A 06/21/2024   Procedure: SIMPLE PARTIAL VULVECTOMY WITH ROTATIONAL FLAP;  Surgeon: Eldonna Mays, MD;  Location: [facility];  Service: Gynecology;  Laterality: N/A;  WITH POSSIBLE ROTATIONAL FLAP    Past Medical Hx:  Past Medical History:  Diagnosis Date   Complication of anesthesia    Constipation    Migraine    none since 2013   Urolithiasis     Family Hx:  Family History  Problem Relation Age of Onset   Heart disease Mother        congestive heart failure   Diabetes Father    Hypertension Father    Colonic polyp Father    Brain cancer Brother    Hypertension Brother    Colon cancer Neg Hx    Breast cancer Neg Hx    Ovarian cancer Neg Hx    Endometrial cancer Neg Hx    Pancreatic cancer Neg Hx    Prostate cancer Neg Hx     Vitals:  Blood pressure 119/70, pulse 88, temperature 98.4 F (36.9  C), temperature source Oral, resp. rate 18, weight 109 lb (49.4 kg), SpO2 100%.  Physical Exam:  General: Well developed, well nourished female in no acute distress. Alert and oriented x 3. Non-toxic appearing. Cardiovascular: Regular rate and rhythm. S1 and S2 normal.  Lungs: Clear to auscultation bilaterally. No wheezes/crackles/rhonchi noted.  Abdomen: Abdomen soft, non-tender and non-obese. Active bowel sounds in all quadrants.  Genitourinary:    See media for photo. 6 cm of erythema and induration noted on the right vulva and 8 cm noted on the left. With palpation of the indurated area on the right, copious brown, foul smelling, cloudy drainage is expressed coming from the aspect of the incision closest to the introitus. Increased warmth of this tissue. Skin blanching. Area is tender on palpation but not severely per pt. Extremities: No bilateral cyanosis, edema, or clubbing.    Assessment/Plan: 64 year old female s/p simple partial vulvectomy with bilateral rhomboid rotational flap for VIN 3 presenting to the office today with suspicion for vulvar incisional infection. Situation discussed with Dr. Viktoria, on call provider. Lab work will be obtained. Will await results to decide next steps including possible admission for IV antibiotics, possible opening of incision with drainage and wash out. Patient has been updated on the plan and is agreeable with recommendations.     Jasmine JONETTA Epps, NP 06/27/2024, 1:35 PM

## 2024-06-27 NOTE — Patient Instructions (Addendum)
 Based on your exam today, there is a concern for an infection within your vulvar incision.   We took a sample of the drainage for culture.  We checked lab work today.   Based on the labs, the plan will be for

## 2024-06-28 ENCOUNTER — Inpatient Hospital Stay (HOSPITAL_COMMUNITY): Admitting: Anesthesiology

## 2024-06-28 ENCOUNTER — Encounter (HOSPITAL_COMMUNITY)
Admission: AD | Disposition: A | Discharge status: Home / Self Care | Payer: Self-pay | Source: Home / Self Care | Attending: Gynecologic Oncology

## 2024-06-28 ENCOUNTER — Encounter (HOSPITAL_COMMUNITY): Payer: Self-pay | Admitting: Gynecologic Oncology

## 2024-06-28 DIAGNOSIS — T8149XA Infection following a procedure, other surgical site, initial encounter: Secondary | ICD-10-CM

## 2024-06-28 HISTORY — PX: INCISION AND DRAINAGE ABSCESS: SHX5864

## 2024-06-28 LAB — BASIC METABOLIC PANEL WITH GFR
Anion gap: 9 (ref 5–15)
BUN: 12 mg/dL (ref 8–23)
CO2: 24 mmol/L (ref 22–32)
Calcium: 9 mg/dL (ref 8.9–10.3)
Chloride: 108 mmol/L (ref 98–111)
Creatinine, Ser: 0.56 mg/dL (ref 0.44–1.00)
GFR, Estimated: >60 mL/min
Glucose, Bld: 113 mg/dL — ABNORMAL HIGH (ref 70–99)
Potassium: 4.4 mmol/L (ref 3.5–5.1)
Sodium: 140 mmol/L (ref 135–145)

## 2024-06-28 LAB — CBC WITH DIFFERENTIAL/PLATELET
Abs Immature Granulocytes: 0.05 K/uL (ref 0.00–0.07)
Basophils Absolute: 0 K/uL (ref 0.0–0.1)
Basophils Relative: 0 %
Eosinophils Absolute: 0.1 K/uL (ref 0.0–0.5)
Eosinophils Relative: 1 %
HCT: 33.3 % — ABNORMAL LOW (ref 36.0–46.0)
Hemoglobin: 11 g/dL — ABNORMAL LOW (ref 12.0–15.0)
Immature Granulocytes: 1 %
Lymphocytes Relative: 10 %
Lymphs Abs: 0.9 K/uL (ref 0.7–4.0)
MCH: 28.4 pg (ref 26.0–34.0)
MCHC: 33 g/dL (ref 30.0–36.0)
MCV: 86 fL (ref 80.0–100.0)
Monocytes Absolute: 0.5 K/uL (ref 0.1–1.0)
Monocytes Relative: 6 %
Neutro Abs: 7.6 K/uL (ref 1.7–7.7)
Neutrophils Relative %: 82 %
Platelets: 283 K/uL (ref 150–400)
RBC: 3.87 MIL/uL (ref 3.87–5.11)
RDW: 13.9 % (ref 11.5–15.5)
WBC: 9.2 K/uL (ref 4.0–10.5)
nRBC: 0 % (ref 0.0–0.2)

## 2024-06-28 SURGERY — INCISION AND DRAINAGE, ABSCESS
Anesthesia: General | Site: Perineum

## 2024-06-28 MED ORDER — BUPIVACAINE-EPINEPHRINE (PF) 0.25% -1:200000 IJ SOLN
INTRAMUSCULAR | Status: AC
  Filled 2024-06-28: qty 30

## 2024-06-28 MED ORDER — OXYCODONE HCL 5 MG PO TABS: 5.0000 mg | ORAL_TABLET | Freq: Once | ORAL | Status: DC | PRN

## 2024-06-28 MED ORDER — LIDOCAINE HCL (CARDIAC) PF 100 MG/5ML IV SOSY
PREFILLED_SYRINGE | INTRAVENOUS | Status: DC | PRN
  Administered 2024-06-28: 60 mg via INTRATRACHEAL

## 2024-06-28 MED ORDER — CHLORHEXIDINE GLUCONATE 0.12 % MT SOLN
15.0000 mL | Freq: Once | OROMUCOSAL | Status: AC
  Administered 2024-06-28: 15 mL via OROMUCOSAL

## 2024-06-28 MED ORDER — ONDANSETRON HCL 4 MG/2ML IJ SOLN
INTRAMUSCULAR | Status: DC | PRN
  Administered 2024-06-28: 4 mg via INTRAVENOUS

## 2024-06-28 MED ORDER — CHLORHEXIDINE GLUCONATE CLOTH 2 % EX PADS
6.0000 | MEDICATED_PAD | Freq: Every day | CUTANEOUS | Status: DC
  Administered 2024-06-28 – 2024-06-30 (×3): 6 via TOPICAL

## 2024-06-28 MED ORDER — PHENYLEPHRINE HCL (PRESSORS) 10 MG/ML IV SOLN
INTRAVENOUS | Status: DC | PRN
  Administered 2024-06-28 (×2): 80 ug via INTRAVENOUS

## 2024-06-28 MED ORDER — ACETAMINOPHEN 10 MG/ML IV SOLN: 750.0000 mg | Freq: Once | INTRAVENOUS | Status: DC | PRN

## 2024-06-28 MED ORDER — METRONIDAZOLE 500 MG/100ML IV SOLN
500.0000 mg | Freq: Two times a day (BID) | INTRAVENOUS | Status: DC
  Administered 2024-06-28 – 2024-06-30 (×4): 500 mg via INTRAVENOUS
  Filled 2024-06-28 (×4): qty 100

## 2024-06-28 MED ORDER — DEXAMETHASONE SOD PHOSPHATE PF 10 MG/ML IJ SOLN
INTRAMUSCULAR | Status: DC | PRN
  Administered 2024-06-28: 5 mg via INTRAVENOUS

## 2024-06-28 MED ORDER — FENTANYL CITRATE (PF) 50 MCG/ML IJ SOSY: 25.0000 ug | PREFILLED_SYRINGE | INTRAMUSCULAR | Status: DC | PRN

## 2024-06-28 MED ORDER — ENOXAPARIN SODIUM 40 MG/0.4ML IJ SOSY
40.0000 mg | PREFILLED_SYRINGE | INTRAMUSCULAR | Status: DC
  Administered 2024-06-29 – 2024-06-30 (×2): 40 mg via SUBCUTANEOUS
  Filled 2024-06-28 (×2): qty 0.4

## 2024-06-28 MED ORDER — MUPIROCIN 2 % EX OINT
1.0000 | TOPICAL_OINTMENT | Freq: Two times a day (BID) | CUTANEOUS | Status: DC
  Administered 2024-06-28 – 2024-06-30 (×6): 1 via NASAL
  Filled 2024-06-28 (×2): qty 22

## 2024-06-28 MED ORDER — FENTANYL CITRATE (PF) 100 MCG/2ML IJ SOLN
INTRAMUSCULAR | Status: AC
  Filled 2024-06-28: qty 2

## 2024-06-28 MED ORDER — SODIUM CHLORIDE 0.9 % IV SOLN
2.0000 g | Freq: Two times a day (BID) | INTRAVENOUS | Status: DC
  Administered 2024-06-28 – 2024-06-30 (×4): 2 g via INTRAVENOUS
  Filled 2024-06-28 (×4): qty 12.5

## 2024-06-28 MED ORDER — BUPIVACAINE HCL (PF) 0.25 % IJ SOLN
INTRAMUSCULAR | Status: AC
  Filled 2024-06-28: qty 30

## 2024-06-28 MED ORDER — MIDAZOLAM HCL 2 MG/2ML IJ SOLN
INTRAMUSCULAR | Status: AC
  Filled 2024-06-28: qty 2

## 2024-06-28 MED ORDER — PROPOFOL 10 MG/ML IV BOLUS
INTRAVENOUS | Status: AC
  Filled 2024-06-28: qty 20

## 2024-06-28 MED ORDER — BUPIVACAINE HCL 0.25 % IJ SOLN
INTRAMUSCULAR | Status: DC | PRN
  Administered 2024-06-28: 30 mL

## 2024-06-28 MED ORDER — FENTANYL CITRATE (PF) 100 MCG/2ML IJ SOLN
INTRAMUSCULAR | Status: DC | PRN
  Administered 2024-06-28 (×2): 25 ug via INTRAVENOUS
  Administered 2024-06-28: 50 ug via INTRAVENOUS

## 2024-06-28 MED ORDER — LACTATED RINGERS IV SOLN: INTRAVENOUS | Status: DC | PRN

## 2024-06-28 MED ORDER — AMISULPRIDE (ANTIEMETIC) 5 MG/2ML IV SOLN: 10.0000 mg | Freq: Once | INTRAVENOUS | Status: DC | PRN

## 2024-06-28 MED ORDER — PROPOFOL 10 MG/ML IV BOLUS
INTRAVENOUS | Status: DC | PRN
  Administered 2024-06-28: 150 mg via INTRAVENOUS

## 2024-06-28 MED ORDER — OXYCODONE HCL 5 MG/5ML PO SOLN: 5.0000 mg | Freq: Once | ORAL | Status: DC | PRN

## 2024-06-28 MED ORDER — LACTATED RINGERS IV SOLN: INTRAVENOUS | Status: DC

## 2024-06-28 SURGICAL SUPPLY — 30 items
BLADE SURG 15 STRL LF DISP TIS (BLADE) ×2 IMPLANT
BNDG GAUZE DERMACEA FLUFF 4 (GAUZE/BANDAGES/DRESSINGS) IMPLANT
BRIEF MESH DISP LRG (UNDERPADS AND DIAPERS) IMPLANT
CATH ROBINSON RED A/P 16FR (CATHETERS) IMPLANT
COVER SURGICAL LIGHT HANDLE (MISCELLANEOUS) ×2 IMPLANT
DRAPE POUCH INSTRU U-SHP 10X18 (DRAPES) ×2 IMPLANT
ELECT REM PT RETURN 15FT ADLT (MISCELLANEOUS) ×2 IMPLANT
GAUZE 4X4 16PLY ~~LOC~~+RFID DBL (SPONGE) IMPLANT
GAUZE PAD ABD 8X10 STRL (GAUZE/BANDAGES/DRESSINGS) IMPLANT
GLOVE BIOGEL PI IND STRL 6.5 (GLOVE) ×2 IMPLANT
GLOVE BIOGEL PI MICRO STRL 6 (GLOVE) ×4 IMPLANT
GOWN STRL REUS W/ TWL LRG LVL3 (GOWN DISPOSABLE) ×2 IMPLANT
KIT TURNOVER KIT A (KITS) ×2 IMPLANT
LEGGING LITHOTOMY PAIR STRL (DRAPES) ×2 IMPLANT
NEEDLE HYPO 22X1.5 SAFETY MO (MISCELLANEOUS) ×2 IMPLANT
PACK PERINEAL COLD (PAD) IMPLANT
PACK VAGINAL MINOR WOMEN LF (CUSTOM PROCEDURE TRAY) ×2 IMPLANT
PACKING GAUZE IODOFORM 1INX5YD (GAUZE/BANDAGES/DRESSINGS) IMPLANT
PAD TELFA 2X3 NADH STRL (GAUZE/BANDAGES/DRESSINGS) IMPLANT
PENCIL SMOKE EVACUATOR (MISCELLANEOUS) ×2 IMPLANT
SCRUB CHG 4% DYNA-HEX 4OZ (MISCELLANEOUS) ×2 IMPLANT
SUT VIC AB 2-0 CT1 18 (SUTURE) ×2 IMPLANT
SUT VIC AB 2-0 CT1 27XBRD (SUTURE) ×2 IMPLANT
SUT VIC AB 2-0 CT1 TAPERPNT 27 (SUTURE) IMPLANT
SUT VIC AB 2-0 SH 27X BRD (SUTURE) IMPLANT
SUT VIC AB 3-0 SH 27XBRD (SUTURE) IMPLANT
SUT VIC AB 4-0 SH 18 (SUTURE) IMPLANT
TOWEL OR DSP ST BLU DLX 10/PK (DISPOSABLE) ×2 IMPLANT
TRAY FOLEY MTR SLVR 16FR STAT (SET/KITS/TRAYS/PACK) IMPLANT
YANKAUER SUCT BULB TIP 10FT TU (MISCELLANEOUS) ×2 IMPLANT

## 2024-06-28 NOTE — Transfer of Care (Signed)
 Immediate Anesthesia Transfer of Care Note  Patient: Jasmine Fox  Procedure(s) Performed: INCISION AND DRAINAGE OF VULVAR WOUND ABSCESS (Perineum)  Patient Location: PACU  Anesthesia Type:General  Level of Consciousness: awake and alert   Airway & Oxygen Therapy: Patient Spontanous Breathing and Patient connected to face mask oxygen  Post-op Assessment: Report given to RN and Post -op Vital signs reviewed and stable  Post vital signs: Reviewed and stable  Last Vitals:  Vitals Value Taken Time  BP 122/74 06/28/24 16:40  Temp    Pulse 68 06/28/24 16:41  Resp 14 06/28/24 16:41  SpO2 100 % 06/28/24 16:41  Vitals shown include unfiled device data.  Last Pain:  Vitals:   06/28/24 0955  TempSrc: Oral  PainSc: 0-No pain      Patients Stated Pain Goal: 4 (06/28/24 0955)  Complications: No notable events documented.

## 2024-06-28 NOTE — H&P (View-Only) (Signed)
 Inpatient Gyn Onc Progress Note  Subjective: Patient reports drainage from vulva overnight. Had several loose bowel movements overnight but no more since 0530. No fevers or chills. Maybe slight improvement in the vulva but reports it is similar overall.     Objective: Vital signs in last 24 hours: Temp:  [97.8 F (36.6 C)-98.3 F (36.8 C)] 97.8 F (36.6 C) (01/06 0955) Pulse Rate:  [65-101] 73 (01/06 0955) Resp:  [14-18] 14 (01/06 0955) BP: (96-128)/(57-79) 104/69 (01/06 0955) SpO2:  [97 %-100 %] 100 % (01/06 0955) Weight:  [109 lb (49.4 kg)] 109 lb (49.4 kg) (01/05 1528) Last BM Date : 06/27/24  Intake/Output from previous day: 01/05 0701 - 01/06 0700 In: 1622.1 [P.O.:1040; I.V.:341.5; IV Piggyback:240.6] Out: -   Physical Examination: General: Alert, oriented, no acute distress. HEENT: Normocephalic, atraumatic.  Chest: Normal work of breathing. . Abdomen: Soft, nontender.   Extremities: Grossly normal range of motion.  Warm, well perfused.  No edema bilaterally. Skin: No rashes or lesions noted. GU: Posterior vulva with outline from prior edge of erythema. Improved from prior particularly on the left. Right still erythematous and indurated, particularly at the most inferior aspect of incision. No pus that could be expressed at this time.   Labs: WBC/Hgb/Hct/Plts:  9.2/11.0/33.3/283 (01/06 9182) BUN/Cr/glu/ALT/AST/amyl/lip:  12/0.56/--/--/--/--/-- (01/06 0817)  CT PELVIS W CONTRAST 06/27/2024  Narrative EXAM: CT PELVIS, WITH IV CONTRAST 06/27/2024 07:19:59 PM  TECHNIQUE: Axial images were acquired through the pelvis with IV contrast. Reformatted images were reviewed. Automated exposure control, iterative reconstruction, and/or weight based adjustment of the mA/kV was utilized to reduce the radiation dose to as low as reasonably achievable.  CONTRAST: 75 cc Omnipaque  350 IV.  COMPARISON: 11/08/2009.  CLINICAL HISTORY: Soft tissue infection suspected, pelvis, no  prior imaging; Per Dr. Viktoria, vulvar incisional infection s/p vulvectomy and flap creation on 06/21/24, evaluate for fluid collection/extent of infection on the vulva.  FINDINGS:  BONES: No acute fracture or focal osseous lesion.  JOINTS: No dislocation. The joint spaces are normal.  SOFT TISSUES: Minimal ill-defined stranding/fluid in the perineum soft tissues, right greater than left. No well-defined drainable fluid collection.  INTRAPELVIC CONTENTS: Limited images of the intrapelvic contents demonstrate no acute abnormality.  IMPRESSION: 1. Minimal ill-defined stranding and fluid in the perineal soft tissues, right greater than left, without a well-defined drainable fluid collection.  Electronically signed by: Franky Crease MD 06/27/2024 08:01 PM EST RP Workstation: [id number]   Assessment: 64 y.o. s/p simple partial vulvectomy on 06/21/24 who was readmitted yesterday in the setting of wound infection.  CBC with WBC down to 9.2 from 10.9 yesterday. Afebrile. Wound with some improvement in erythema particularly on left side but with ongoing induration, erythema on right. CT scan reviewed. No well-defined drainable fluid collection but with ill-defined stranding, fluid, particularly on the right in the perineal soft tissues.  Culture with few gram positive cocci and rare gram negative rods, continue to follow-up culture results.   Plan: - Reviewed OR for incision and drainage, packing of wound likely at the right posterior aspect of the wound. R/b/a of surgery reviewed. Verbal consent obtained. Will obtain written consent in Preop. - Continue IV abx until further results from culture. Currently on vanc/cefepime /flagyl . MRSA screen positive so continue with vanc for MRSA coverage. - NPO for OR - Will likely require BID dressing change following OR. Pt feels that husband will be able to perform this. Will perform teaching postop with nursing.    Dispo:  Discharge to home  possibly tomorrow pending speciation of cultures, narrowing antibiotics.    LOS: 1 day    Pearl Bents 06/28/2024, 1:24 PM

## 2024-06-28 NOTE — Anesthesia Preprocedure Evaluation (Signed)
"                                    Anesthesia Evaluation    Reviewed: Allergy & Precautions, Patient's Chart, lab work & pertinent test results  Airway Mallampati: I  TM Distance: >3 FB Neck ROM: Full    Dental no notable dental hx.    Pulmonary former smoker   Pulmonary exam normal        Cardiovascular negative cardio ROS Normal cardiovascular exam     Neuro/Psych  Headaches    GI/Hepatic negative GI ROS, Neg liver ROS,,,  Endo/Other  negative endocrine ROS    Renal/GU negative Renal ROS     Musculoskeletal negative musculoskeletal ROS (+)    Abdominal   Peds  Hematology  (+) Blood dyscrasia, anemia   Anesthesia Other Findings  VULVAR LESION  Reproductive/Obstetrics                              Anesthesia Physical Anesthesia Plan  ASA: 2  Anesthesia Plan: General   Post-op Pain Management:    Induction: Intravenous  PONV Risk Score and Plan: 3 and Ondansetron , Dexamethasone , Midazolam  and Treatment may vary due to age or medical condition  Airway Management Planned: LMA  Additional Equipment:   Intra-op Plan:   Post-operative Plan: Extubation in OR  Informed Consent: I have reviewed the patients History and Physical, chart, labs and discussed the procedure including the risks, benefits and alternatives for the proposed anesthesia with the patient or authorized representative who has indicated his/her understanding and acceptance.     Dental advisory given  Plan Discussed with: CRNA  Anesthesia Plan Comments:         Anesthesia Quick Evaluation  "

## 2024-06-28 NOTE — Anesthesia Procedure Notes (Signed)
 Procedure Name: Intubation Date/Time: 06/28/2024 3:49 PM  Performed by: Demeco Ducksworth, CRNAPre-anesthesia Checklist: Patient identified, Emergency Drugs available, Suction available and Patient being monitored Patient Re-evaluated:Patient Re-evaluated prior to induction Oxygen Delivery Method: Circle system utilized Preoxygenation: Pre-oxygenation with 100% oxygen Induction Type: IV induction Ventilation: Mask ventilation without difficulty LMA: LMA inserted LMA Size: 4.0 Number of attempts: 1 Airway Equipment and Method: Oral airway Placement Confirmation: ETT inserted through vocal cords under direct vision, positive ETCO2 and breath sounds checked- equal and bilateral Tube secured with: Tape Dental Injury: Teeth and Oropharynx as per pre-operative assessment

## 2024-06-28 NOTE — TOC Initial Note (Signed)
 Transition of Care [facility]) - Initial/Assessment Note    Patient Details  Name: Jasmine Fox MRN: [id number] Date of Birth: March 31, 1961  Transition of Care [facility]) CM/SW Contact:    Alfonse JONELLE Rex, RN Phone Number: 06/28/2024, 1:54 PM  Clinical Narrative:  Patient  s/p simple partial vulvectomy with bilateral rhomboid rotational flaps on 06/21/2024, direct admit from MD office with with suspicion for vulvar incisional infection.  INPT CM will continue to follow for dc needs.             Patient Goals and CMS Choice            Expected Discharge Plan and Services                                              Prior Living Arrangements/Services                       Activities of Daily Living   ADL Screening (condition at time of admission) Independently performs ADLs?: Yes (appropriate for developmental age) Is the patient deaf or have difficulty hearing?: No Does the patient have difficulty seeing, even when wearing glasses/contacts?: No Does the patient have difficulty concentrating, remembering, or making decisions?: No  Permission Sought/Granted                  Emotional Assessment              Admission diagnosis:  Post op infection [T81.40XA] Patient Active Problem List   Diagnosis Date Noted   Incisional infection 06/27/2024   Post op infection 06/27/2024   Vulvar intraepithelial neoplasia (VIN) grade 3 06/21/2024   Leukoplakia of vulva 05/03/2024   FH: colon polyps 12/29/2022   Boil 11/15/2019   Constipation, chronic 10/26/2012   Right lower quadrant abdominal mass 10/26/2012   Other and unspecified ovarian cyst 09/24/2012   PCP:  Toribio Jerel MATSU, MD Pharmacy:   [facility] [place], [place] - [address] [place] [address] Phone: [phone] Fax: [phone]     Social Drivers of Health (SDOH) Social History: SDOH Screenings   Food Insecurity: No Food Insecurity (06/27/2024)  Housing: Low  Risk (06/27/2024)  Transportation Needs: No Transportation Needs (06/27/2024)  Utilities: Not At Risk (06/27/2024)  Depression (PHQ2-9): Low Risk (05/23/2024)  Financial Resource Strain: Low Risk (04/12/2024)  Physical Activity: Sufficiently Active (04/12/2024)  Social Connections: Moderately Isolated (04/12/2024)  Stress: No Stress Concern Present (04/12/2024)  Tobacco Use: Medium Risk (06/28/2024)   SDOH Interventions:     Readmission Risk Interventions     No data to display

## 2024-06-28 NOTE — Op Note (Signed)
 GYNECOLOGIC ONCOLOGY OPERATIVE NOTE  Date of Service: 06/27/2024  Preoperative Diagnosis: Vulvar postop wound abscess  Postoperative Diagnosis: Same  Procedures: Incision and drainage of vulvar wound abscess  Surgeon: Hoy Masters, MD  Assistants: Eleanor Epps, NP  Anesthesia: General  Estimated Blood Loss: 15 ml    Fluids: 600 ml, crystalloid  Urine Output: None  Findings: Vulva with outline of prior extent of erythema. Current erythema has receded from this line, improved more on the left than the right. Ongoing induration along bilateral gluteal incision lines from donor sites of flaps, more induration on the right. Purulent drainage able to be expressed bilaterally. Midline wound at location of flap placement is overall healing well and approximated, tissue healthy without erythema or induration. Opened part of the incision on the gluteus bilaterally with expression of purulence. Copious irrigation. Packed bilaterally with 1inch iodoform packing tape (one piece on each side).   Specimens:  ID Type Source Tests Collected by Time Destination  A : Vulvar wound for culture Tissue Wound AEROBIC/ANAEROBIC CULTURE W GRAM STAIN (SURGICAL/DEEP WOUND) Masters Hoy, MD 06/28/2024 1607     Complications:  None  Indications for Procedure: Jasmine Fox is a 64 y.o. woman who underwent simple partial vulvectomy and bilateral rotation flap placement on 06/21/24 for VIN3. She represented with vulvar wound infection with concern for abscess given purulent drainage and induration. Some improvement following initiation of antibiotics, but given significant ongoing induration, erythema and purulent drainage, recommendation for proceeding to OR for incision and drainage.  Prior to the procedure, all risks, benefits, and alternatives were discussed and informed surgical consent was signed.  Procedure: Patient was taken to the operating room where general anesthesia was achieved.  She was  positioned in dorsal lithotomy and prepped and draped in sterile fashion. The above findings were noted.  With a scalpel, part of the incision on the left gluteus was incised with the wound opened. Purulent drainage was expressed. A piece of tissue from the base of the wound was grasped and excised with a scalpel to be sent for culture. The wound was copiously irrigated with saline. A similar procedure was performed on the right. The midline perineal portion with the flaps was maintained and not disrupted. No purulence or induration was noted from this area.  With both wounds opened, they were copiously irrigated with irrisept followed by saline. Hemostasis was achieved in the base of the wounds with electrocautery. The wound were then each packed with 1inch iodoform packing tape.   Patient tolerated the procedure well. Sponge, lap, and instrument counts were correct.  Patient was continued on her schedule IV antibiotics. Patient was extubated and taken to the PACU in stable condition.  Hoy Masters, MD Gynecologic Oncology

## 2024-06-28 NOTE — Progress Notes (Addendum)
 Inpatient Gyn Onc Progress Note  Subjective: Patient reports drainage from vulva overnight. Had several loose bowel movements overnight but no more since 0530. No fevers or chills. Maybe slight improvement in the vulva but reports it is similar overall.     Objective: Vital signs in last 24 hours: Temp:  [97.8 F (36.6 C)-98.3 F (36.8 C)] 97.8 F (36.6 C) (01/06 0955) Pulse Rate:  [65-101] 73 (01/06 0955) Resp:  [14-18] 14 (01/06 0955) BP: (96-128)/(57-79) 104/69 (01/06 0955) SpO2:  [97 %-100 %] 100 % (01/06 0955) Weight:  [109 lb (49.4 kg)] 109 lb (49.4 kg) (01/05 1528) Last BM Date : 06/27/24  Intake/Output from previous day: 01/05 0701 - 01/06 0700 In: 1622.1 [P.O.:1040; I.V.:341.5; IV Piggyback:240.6] Out: -   Physical Examination: General: Alert, oriented, no acute distress. HEENT: Normocephalic, atraumatic.  Chest: Normal work of breathing. . Abdomen: Soft, nontender.   Extremities: Grossly normal range of motion.  Warm, well perfused.  No edema bilaterally. Skin: No rashes or lesions noted. GU: Posterior vulva with outline from prior edge of erythema. Improved from prior particularly on the left. Right still erythematous and indurated, particularly at the most inferior aspect of incision. No pus that could be expressed at this time.   Labs: WBC/Hgb/Hct/Plts:  9.2/11.0/33.3/283 (01/06 9182) BUN/Cr/glu/ALT/AST/amyl/lip:  12/0.56/--/--/--/--/-- (01/06 0817)  CT PELVIS W CONTRAST 06/27/2024  Narrative EXAM: CT PELVIS, WITH IV CONTRAST 06/27/2024 07:19:59 PM  TECHNIQUE: Axial images were acquired through the pelvis with IV contrast. Reformatted images were reviewed. Automated exposure control, iterative reconstruction, and/or weight based adjustment of the mA/kV was utilized to reduce the radiation dose to as low as reasonably achievable.  CONTRAST: 75 cc Omnipaque  350 IV.  COMPARISON: 11/08/2009.  CLINICAL HISTORY: Soft tissue infection suspected, pelvis, no  prior imaging; Per Dr. Viktoria, vulvar incisional infection s/p vulvectomy and flap creation on 06/21/24, evaluate for fluid collection/extent of infection on the vulva.  FINDINGS:  BONES: No acute fracture or focal osseous lesion.  JOINTS: No dislocation. The joint spaces are normal.  SOFT TISSUES: Minimal ill-defined stranding/fluid in the perineum soft tissues, right greater than left. No well-defined drainable fluid collection.  INTRAPELVIC CONTENTS: Limited images of the intrapelvic contents demonstrate no acute abnormality.  IMPRESSION: 1. Minimal ill-defined stranding and fluid in the perineal soft tissues, right greater than left, without a well-defined drainable fluid collection.  Electronically signed by: Franky Crease MD 06/27/2024 08:01 PM EST RP Workstation: [id number]   Assessment: 64 y.o. s/p simple partial vulvectomy on 06/21/24 who was readmitted yesterday in the setting of wound infection.  CBC with WBC down to 9.2 from 10.9 yesterday. Afebrile. Wound with some improvement in erythema particularly on left side but with ongoing induration, erythema on right. CT scan reviewed. No well-defined drainable fluid collection but with ill-defined stranding, fluid, particularly on the right in the perineal soft tissues.  Culture with few gram positive cocci and rare gram negative rods, continue to follow-up culture results.   Initially with frequent water  bowel movements after CT scan but now with no BM since 0530. Did drink oral contrast for CT scan.  Plan: - Reviewed OR for incision and drainage, packing of wound likely at the right posterior aspect of the wound. R/b/a of surgery reviewed. Verbal consent obtained. Will obtain written consent in Preop. - Continue IV abx until further results from culture. Currently on vanc/cefepime /flagyl . MRSA screen positive so continue with vanc for MRSA coverage. - NPO for OR - Will likely require BID dressing change following OR. Pt  feels that husband will be able to perform this. Will perform teaching postop with nursing.  - Will discontinue c diff screen given no BM since 0530. Likely was in setting of oral contrast. - Reviewed pathology results from last week's surgery.   Dispo:  Discharge to home possibly tomorrow pending speciation of cultures, narrowing antibiotics.    LOS: 1 day    Jasmine Fox 06/28/2024, 1:24 PM

## 2024-06-28 NOTE — Progress Notes (Signed)
" ° ° °  PROCEDURAL EXPEDITER PROGRESS NOTE  Patient Name: Jasmine Fox  DOB:1960-07-31 Date of Admission: 06/27/2024  Date of Assessment:06/28/2024   -------------------------------------------------------------------------------------------------------------------   Brief clinical summary: 64yr old female with Hx of vulvar intraepithelial  neoplasia Having surgery today for wide excision vulvectomy.  Orders in place:  Yes   Communication with surgical team if no orders: n/a  Labs, test, and orders reviewed: yes  Requires surgical clearance:  No  What type of clearance: n/a  Clearance received: n/a  Barriers noted:pt PCR positive for MRSA and Staph.    Intervention provided by [facility] team: reach out to the nurse caring by seccure chat for pt to start bactoban and get pt ready for OR.    Barrier resolved:  no   -------------------------------------------------------------------------------------------------------------------  [employer], Ronal DELENA Bald Please contact us  directly via secure chat (search for [facility]) or by calling us  at [phone] [facility]).  "

## 2024-06-28 NOTE — Plan of Care (Signed)
   Problem: Health Behavior/Discharge Planning: Goal: Ability to manage health-related needs will improve Outcome: Progressing

## 2024-06-28 NOTE — Interval H&P Note (Signed)
 History and Physical Interval Note:  06/28/2024 1:34 PM  Jasmine Fox  has presented today for surgery, with the diagnosis of VULVAR LEASION.  The various methods of treatment have been discussed with the patient and family. After consideration of risks, benefits and other options for treatment, the patient has consented to : pelvic exam under anesthesia, incision and drainage of vulva as a surgical intervention.  The patient's history has been reviewed, patient examined, no change in status, stable for surgery.  I have reviewed the patient's chart and labs.  Questions were answered to the patient's satisfaction.     Tamirra Sienkiewicz

## 2024-06-29 ENCOUNTER — Encounter (HOSPITAL_COMMUNITY): Payer: Self-pay | Admitting: Psychiatry

## 2024-06-29 LAB — BASIC METABOLIC PANEL WITH GFR
Anion gap: 10 (ref 5–15)
BUN: 12 mg/dL (ref 8–23)
CO2: 25 mmol/L (ref 22–32)
Calcium: 9.4 mg/dL (ref 8.9–10.3)
Chloride: 107 mmol/L (ref 98–111)
Creatinine, Ser: 0.57 mg/dL (ref 0.44–1.00)
GFR, Estimated: >60 mL/min
Glucose, Bld: 129 mg/dL — ABNORMAL HIGH (ref 70–99)
Potassium: 4.5 mmol/L (ref 3.5–5.1)
Sodium: 142 mmol/L (ref 135–145)

## 2024-06-29 LAB — CBC WITH DIFFERENTIAL/PLATELET
Abs Immature Granulocytes: 0.03 K/uL (ref 0.00–0.07)
Basophils Absolute: 0 K/uL (ref 0.0–0.1)
Basophils Relative: 0 %
Eosinophils Absolute: 0 K/uL (ref 0.0–0.5)
Eosinophils Relative: 0 %
HCT: 33.1 % — ABNORMAL LOW (ref 36.0–46.0)
Hemoglobin: 10.7 g/dL — ABNORMAL LOW (ref 12.0–15.0)
Immature Granulocytes: 0 %
Lymphocytes Relative: 12 %
Lymphs Abs: 0.9 K/uL (ref 0.7–4.0)
MCH: 28.1 pg (ref 26.0–34.0)
MCHC: 32.3 g/dL (ref 30.0–36.0)
MCV: 86.9 fL (ref 80.0–100.0)
Monocytes Absolute: 0.2 K/uL (ref 0.1–1.0)
Monocytes Relative: 3 %
Neutro Abs: 6 K/uL (ref 1.7–7.7)
Neutrophils Relative %: 85 %
Platelets: 300 K/uL (ref 150–400)
RBC: 3.81 MIL/uL — ABNORMAL LOW (ref 3.87–5.11)
RDW: 13.9 % (ref 11.5–15.5)
WBC: 7.1 K/uL (ref 4.0–10.5)
nRBC: 0 % (ref 0.0–0.2)

## 2024-06-29 NOTE — Progress Notes (Signed)
 GYN Oncology Progress Note  Patient seen with Dr. Viktoria. Having some nausea and has called for medication. Headache reported earlier this am has improved. Given nausea, will continue with current plan of care. Discharge on hold pending improvement in nausea.

## 2024-06-29 NOTE — Anesthesia Postprocedure Evaluation (Signed)
"   Anesthesia Post Note  Patient: Jasmine Fox  Procedure(s) Performed: INCISION AND DRAINAGE OF VULVAR WOUND ABSCESS (Perineum)     Patient location during evaluation: PACU Anesthesia Type: General Level of consciousness: awake Pain management: pain level controlled Vital Signs Assessment: post-procedure vital signs reviewed and stable Respiratory status: spontaneous breathing, nonlabored ventilation and respiratory function stable Cardiovascular status: blood pressure returned to baseline and stable Postop Assessment: no apparent nausea or vomiting Anesthetic complications: no   No notable events documented.  Last Vitals:  Vitals:   06/29/24 0540 06/29/24 0947  BP: 110/72 103/65  Pulse: (!) 54 (!) 58  Resp: 17 16  Temp: (!) 36.4 C 36.7 C  SpO2: 96% 98%    Last Pain:  Vitals:   06/29/24 0947  TempSrc: Oral  PainSc: Asleep                 Cord Wilczynski P Quillan Whitter      "

## 2024-06-29 NOTE — Plan of Care (Signed)
   Problem: Clinical Measurements: Goal: Diagnostic test results will improve Outcome: Progressing

## 2024-06-29 NOTE — Progress Notes (Addendum)
 GYN Oncology Progress Note  64 year old female s/p simple partial vulvectomy with bilateral rhomboid rotational flap for VIN 3 currently admitted for vulvar incisional infection.   1 Day Post-Op Procedures (LRB): INCISION AND DRAINAGE OF VULVAR WOUND ABSCESS (N/A)  Subjective: Patient reports doing well this am. Last BM was yesterday am. Tolerated a chicken sandwich last pm. Had mild nausea this am when getting out of bed which resolved. No emesis. Has voided twice without difficulty since surgery. Minimal to no vulvar pain reported. Vulvar packing has not had to be changed yet. Feels steady when ambulating. No respiratory issues voiced. No needs voiced.  Objective: Vital signs in last 24 hours: Temp:  [97.5 F (36.4 C)-98.5 F (36.9 C)] 97.5 F (36.4 C) (01/07 0540) Pulse Rate:  [54-77] 54 (01/07 0540) Resp:  [11-17] 17 (01/07 0540) BP: (95-122)/(61-76) 110/72 (01/07 0540) SpO2:  [95 %-100 %] 96 % (01/07 0540) Weight:  [108 lb 14.5 oz (49.4 kg)] 108 lb 14.5 oz (49.4 kg) (01/06 0955) Last BM Date : 06/27/24  Intake/Output from previous day: 01/06 0701 - 01/07 0700 In: 2302.4 [P.O.:540; I.V.:1362.4; IV Piggyback:400] Out: 50 [Blood:50]  Physical Examination: General: alert, cooperative, appears stated age, and no distress Resp: clear to auscultation bilaterally Cardio: regular rate and rhythm, S1, S2 normal, no murmur, click, rub or gallop GI: soft, non-tender; bowel sounds normal; no masses,  no organomegaly Extremities: extremities normal, atraumatic, no cyanosis or edema Vulva assessed. Packing in place in right and left incisional openings. Decrease in erythema and edema of the vulva.   Labs: WBC/Hgb/Hct/Plts:  7.1/10.7/33.1/300 (01/07 9546) BUN/Cr/glu/ALT/AST/amyl/lip:  12/0.57/--/--/--/--/-- (01/07 0453)  Assessment: 64 y.o. s/p Procedures: INCISION AND DRAINAGE OF VULVAR WOUND ABSCESS: stable Pain:  Pain is well-controlled on PRN medications.  Heme: Hgb 10.7 and Hct  33.1. Overall stable.   ID: WBC 7.1. Decreased from WBC count at admission. On IV antibiotics: vanc, flagyl , cefepime . Culture from admission day with moderate staph aureus and few group B strep. Additional tissue sample taken from the OR for culture with prelim rare WBC present.  CV: BP and HR stable. Continue to monitor while inpatient.  GI:  Tolerating po: yes. Antiemetics ordered prn.  GU: Voiding without difficulty. Creatinine 0.57 this am.     FEN: No critical values on am labs.   Prophylaxis: SCDs and lovenox  ordered  Plan: Pt doing well.  Continue with IV abxs at this time.  Awaiting additional information from cultures to guide oral abx selection. Could be discharged as soon as this afternoon or in the am pending results and if still doing well.   LOS: 2 days    Jasmine Fox 06/29/2024, 8:19 AM

## 2024-06-30 ENCOUNTER — Other Ambulatory Visit (HOSPITAL_COMMUNITY): Payer: Self-pay

## 2024-06-30 DIAGNOSIS — R110 Nausea: Secondary | ICD-10-CM | POA: Insufficient documentation

## 2024-06-30 LAB — CBC WITH DIFFERENTIAL/PLATELET
Abs Immature Granulocytes: 0.04 K/uL (ref 0.00–0.07)
Basophils Absolute: 0 K/uL (ref 0.0–0.1)
Basophils Relative: 0 %
Eosinophils Absolute: 0.1 K/uL (ref 0.0–0.5)
Eosinophils Relative: 1 %
HCT: 31.1 % — ABNORMAL LOW (ref 36.0–46.0)
Hemoglobin: 9.9 g/dL — ABNORMAL LOW (ref 12.0–15.0)
Immature Granulocytes: 1 %
Lymphocytes Relative: 33 %
Lymphs Abs: 2.3 K/uL (ref 0.7–4.0)
MCH: 28.2 pg (ref 26.0–34.0)
MCHC: 31.8 g/dL (ref 30.0–36.0)
MCV: 88.6 fL (ref 80.0–100.0)
Monocytes Absolute: 0.4 K/uL (ref 0.1–1.0)
Monocytes Relative: 6 %
Neutro Abs: 4.2 K/uL (ref 1.7–7.7)
Neutrophils Relative %: 59 %
Platelets: 261 K/uL (ref 150–400)
RBC: 3.51 MIL/uL — ABNORMAL LOW (ref 3.87–5.11)
RDW: 14.2 % (ref 11.5–15.5)
WBC: 7.1 K/uL (ref 4.0–10.5)
nRBC: 0 % (ref 0.0–0.2)

## 2024-06-30 LAB — BASIC METABOLIC PANEL WITH GFR
Anion gap: 8 (ref 5–15)
BUN: 12 mg/dL (ref 8–23)
CO2: 25 mmol/L (ref 22–32)
Calcium: 8.8 mg/dL — ABNORMAL LOW (ref 8.9–10.3)
Chloride: 109 mmol/L (ref 98–111)
Creatinine, Ser: 0.67 mg/dL (ref 0.44–1.00)
GFR, Estimated: >60 mL/min
Glucose, Bld: 101 mg/dL — ABNORMAL HIGH (ref 70–99)
Potassium: 4.1 mmol/L (ref 3.5–5.1)
Sodium: 143 mmol/L (ref 135–145)

## 2024-06-30 LAB — AEROBIC CULTURE W GRAM STAIN (SUPERFICIAL SPECIMEN): Gram Stain: NONE SEEN

## 2024-06-30 MED ORDER — DOXYCYCLINE HYCLATE 100 MG PO TABS
100.0000 mg | ORAL_TABLET | Freq: Two times a day (BID) | ORAL | 0 refills | Status: AC
  Filled 2024-06-30: qty 8, 4d supply, fill #0

## 2024-06-30 MED ORDER — AMOXICILLIN-POT CLAVULANATE 875-125 MG PO TABS
1.0000 | ORAL_TABLET | Freq: Two times a day (BID) | ORAL | 0 refills | Status: AC
  Filled 2024-06-30: qty 8, 4d supply, fill #0

## 2024-06-30 MED ORDER — ONDANSETRON HCL 4 MG PO TABS
4.0000 mg | ORAL_TABLET | Freq: Three times a day (TID) | ORAL | 0 refills | Status: AC | PRN
  Filled 2024-06-30: qty 20, 7d supply, fill #0

## 2024-06-30 MED ORDER — DOXYCYCLINE HYCLATE 100 MG PO TABS
100.0000 mg | ORAL_TABLET | Freq: Two times a day (BID) | ORAL | Status: DC
  Administered 2024-06-30: 100 mg via ORAL
  Filled 2024-06-30: qty 1

## 2024-06-30 MED ORDER — AMOXICILLIN-POT CLAVULANATE 875-125 MG PO TABS
1.0000 | ORAL_TABLET | Freq: Two times a day (BID) | ORAL | Status: DC
  Administered 2024-06-30: 1 via ORAL
  Filled 2024-06-30: qty 1

## 2024-06-30 NOTE — Progress Notes (Signed)
 Nurse reviewed discharge instructions with pt and her husband.  Husband packed pt's incisions prior to discharge and all questions answered.  Medications delivered to pt.

## 2024-06-30 NOTE — Progress Notes (Signed)
 GYN Oncology Progress Note  64 year old female s/p simple partial vulvectomy with bilateral rhomboid rotational flap for VIN 3 currently admitted for vulvar incisional infection.   2 Days Post-Op Procedures (LRB): INCISION AND DRAINAGE OF VULVAR WOUND ABSCESS (N/A)  Subjective: Patient reports feeling better. Nausea has improved. Tolerating diet. No significant vulvar pain reported. Voiding without difficulty. No BM yesterday or today. Feels ready to go home. Her husband watched the dressing change yesterday. Will plan for him to change the dressing today with RN assist.  Objective: Vital signs in last 24 hours: Temp:  [97.4 F (36.3 C)-98 F (36.7 C)] 97.4 F (36.3 C) (01/08 0615) Pulse Rate:  [57-67] 62 (01/08 0615) Resp:  [16-17] 16 (01/08 0615) BP: (101-110)/(57-74) 101/57 (01/08 0615) SpO2:  [96 %-100 %] 100 % (01/08 0615) Last BM Date : 06/28/24  Intake/Output from previous day: 01/07 0701 - 01/08 0700 In: 2049.7 [P.O.:1440; IV Piggyback:609.7] Out: -   Physical Examination: General: alert, cooperative, appears stated age, and no distress Resp: clear to auscultation bilaterally Cardio: regular rate and rhythm, S1, S2 normal, no murmur, click, rub or gallop GI: soft, non-tender; bowel sounds normal; no masses,  no organomegaly Extremities: extremities normal, atraumatic, no cyanosis or edema Vulva assessed. Packing in place in right and left incisional openings. Continued decrease in erythema and edema of the vulva- significantly improved.   Labs: WBC/Hgb/Hct/Plts:  7.1/9.9/31.1/261 (01/08 9482) BUN/Cr/glu/ALT/AST/amyl/lip:  12/0.67/--/--/--/--/-- (01/08 9482)  Assessment: 64 y.o. s/p Procedures: INCISION AND DRAINAGE OF VULVAR WOUND ABSCESS: stable Pain:  Pain is well-controlled on PRN medications.  Heme: Hgb 9.9 and Hct 31.1. Overall stable.   ID: WBC 7.1. Decreased from WBC count at admission. On IV antibiotics: vanc, flagyl , cefepime . Culture from admission day  with moderate staph aureus and few group B strep. Additional tissue sample taken from the OR for culture with prelim of few staph aureus. Plan for discharge home today on oral antibiotics.  CV: BP and HR stable. Continue to monitor while inpatient.  GI:  Tolerating po: yes. Antiemetics ordered prn.  GU: Voiding without difficulty. Creatinine 0.67 this am.     FEN: No critical values on am labs.   Prophylaxis: SCDs and lovenox  ordered  Plan: Pt doing well.  Plan for discharge home today on oral antibiotics.  Follow up appointment has been arranged in our office for Monday.  RN to assist husband with dressing change prior to discharge and provide supplies.    LOS: 3 days    Eleanor JONETTA Epps 06/30/2024, 8:46 AM

## 2024-06-30 NOTE — Plan of Care (Signed)
" °  Problem: Education: Goal: Knowledge of General Education information will improve Description: Including pain rating scale, medication(s)/side effects and non-pharmacologic comfort measures Outcome: Completed/Met   Problem: Health Behavior/Discharge Planning: Goal: Ability to manage health-related needs will improve Outcome: Completed/Met   Problem: Clinical Measurements: Goal: Ability to maintain clinical measurements within normal limits will improve Outcome: Completed/Met Goal: Will remain free from infection Outcome: Completed/Met Goal: Diagnostic test results will improve Outcome: Completed/Met Goal: Respiratory complications will improve Outcome: Completed/Met Goal: Cardiovascular complication will be avoided Outcome: Completed/Met   Problem: Activity: Goal: Risk for activity intolerance will decrease Outcome: Completed/Met   Problem: Nutrition: Goal: Adequate nutrition will be maintained Outcome: Completed/Met   Problem: Coping: Goal: Level of anxiety will decrease Outcome: Completed/Met   Problem: Elimination: Goal: Will not experience complications related to bowel motility Outcome: Completed/Met Goal: Will not experience complications related to urinary retention Outcome: Completed/Met   Problem: Pain Managment: Goal: General experience of comfort will improve and/or be controlled Outcome: Completed/Met   Problem: Safety: Goal: Ability to remain free from injury will improve Outcome: Completed/Met   Problem: Skin Integrity: Goal: Risk for impaired skin integrity will decrease Outcome: Completed/Met   Problem: Education: Goal: Knowledge of the prescribed therapeutic regimen will improve Outcome: Completed/Met   Problem: Bowel/Gastric: Goal: Gastrointestinal status for postoperative course will improve Outcome: Completed/Met   Problem: Cardiac: Goal: Ability to maintain an adequate cardiac output Outcome: Completed/Met Goal: Will show no  evidence of cardiac arrhythmias Outcome: Completed/Met   Problem: Nutritional: Goal: Will attain and maintain optimal nutritional status Outcome: Completed/Met   Problem: Neurological: Goal: Will regain or maintain usual level of consciousness Outcome: Completed/Met   Problem: Clinical Measurements: Goal: Ability to maintain clinical measurements within normal limits Outcome: Completed/Met Goal: Postoperative complications will be avoided or minimized Outcome: Completed/Met   Problem: Respiratory: Goal: Will regain and/or maintain adequate ventilation Outcome: Completed/Met Goal: Respiratory status will improve Outcome: Completed/Met   Problem: Skin Integrity: Goal: Demonstrates signs of wound healing without infection Outcome: Completed/Met   Problem: Urinary Elimination: Goal: Will remain free from infection Outcome: Completed/Met Goal: Ability to achieve and maintain adequate urine output Outcome: Completed/Met   Problem: Education: Goal: Knowledge of the prescribed therapeutic regimen will improve Outcome: Completed/Met Goal: Understanding of sexual limitations or changes related to disease process or condition will improve Outcome: Completed/Met Goal: Individualized Educational Video(s) Outcome: Completed/Met   Problem: Self-Concept: Goal: Communication of feelings regarding changes in body function or appearance will improve Outcome: Completed/Met   Problem: Skin Integrity: Goal: Demonstration of wound healing without infection will improve Outcome: Completed/Met   "

## 2024-06-30 NOTE — Discharge Instructions (Signed)
 You are being discharged home on the antibiotics called Augmentin  and Doxycycline . They are both given twice daily. Take with food. When taking doxycycline , you will need to avoid sun exposure due to this increasing risk for skin burns.   Plan to change the vulvar dressing twice daily and if you have a bowel movement or the packing falls out. You can rinse the open areas with saline or use the handheld shower to rinse the areas lightly then repack.   Please call the office for any symptoms such as fever, increased drainage, increased redness around the openings, increased pain, not feeling well, or any new symptoms.    You can monitor your bowels and take something as needed. The goal would be to prevent straining when having a bowel movement but also trying to avoid diarrhea.  We will plan on seeing you in the office on Monday to recheck the vulvar wound. Please call for any needs in between that time. The after hours number is [phone] and have them page the GYN Oncologist on call if you have any needs.

## 2024-06-30 NOTE — Discharge Summary (Signed)
 " Physician Discharge Summary  Patient ID: Jasmine Fox MRN: [id number] DOB/AGE: 64/04/1961 64 y.o.  Admit date: 06/27/2024 Discharge date: 06/30/2024  Admission Diagnoses: Post op infection  Discharge Diagnoses:  Principal Problem:   Post op infection Active Problems:   Post-operative wound abscess   Nausea without vomiting   Discharged Condition:  The patient is in good condition and stable for discharge.    Hospital Course: 64 y.o. female s/p simple partial vulvectomy on 06/21/24 for VIN 3 who was readmitted on 06/27/2024 in the setting of wound infection. She was started on IV antibiotics including vancomycin , flagyl , and cefepime . A CT scan of the pelvis was performed showing minimal ill-defined stranding and fluid in the perineal soft tissues, right greater than left, without a well-defined drainable fluid collection.   She is s/p incision and drainage of vulvar wound abscess on 06/28/2024 with Dr. Eldonna. Culture from admission day with moderate staph aureus and few group B strep. Additional tissue sample taken from the OR for culture with prelim of few staph aureus. Per pharmacist recommendation, she is to be discharged home on augmentin  and doxycycline  for a total of 7 days of therapy including the IV antibiotics she received while inpatient. Patient and husband were changing the vulvar wounds at discharge and patient has follow up in the office on Monday, July 04, 2024.   Consults: pharmacy for IV antibiotics  Significant Diagnostic Studies: Labs, CT pelvis.  Treatments: IV antibiotics, antiemetics, surgery  Discharge Exam: Blood pressure (!) 101/57, pulse 62, temperature (!) 97.4 F (36.3 C), temperature source Oral, resp. rate 16, height 5' 4 (1.626 m), weight 108 lb 14.5 oz (49.4 kg), SpO2 100%. General: alert, cooperative, appears stated age, and no distress Resp: clear to auscultation bilaterally Cardio: regular rate and rhythm, S1, S2 normal, no murmur, click, rub or  gallop GI: soft, non-tender; bowel sounds normal; no masses,  no organomegaly Extremities: extremities normal, atraumatic, no cyanosis or edema Vulva assessed. Packing in place in right and left incisional openings. Continued decrease in erythema and edema of the vulva- significantly improved.  Disposition: Discharge disposition: 01-Home or Self Care       Discharge Instructions     Call MD for:  difficulty breathing, headache or visual disturbances   Complete by: As directed    Call MD for:  extreme fatigue   Complete by: As directed    Call MD for:  hives   Complete by: As directed    Call MD for:  persistant dizziness or light-headedness   Complete by: As directed    Call MD for:  persistant nausea and vomiting   Complete by: As directed    Call MD for:  redness, tenderness, or signs of infection (pain, swelling, redness, odor or green/yellow discharge around incision site)   Complete by: As directed    Call MD for:  severe uncontrolled pain   Complete by: As directed    Call MD for:  temperature >100.4   Complete by: As directed    Diet - low sodium heart healthy   Complete by: As directed    Discharge wound care:   Complete by: As directed    Vulvar open incisions need to be changed/packed at least twice daily. If you have a BM and/or packing falls out, please change the dressing. If changed due to having BM, this occurrence can count towards the twice daily dressing changes. Plan to pack the open areas on the right and left vulva lightly with  1 inch iodoform packing. Do not pack forcefully on the lateral aspects of the openings to prevent opening of the rest of the vulva incision   Driving Restrictions   Complete by: As directed    Do not take narcotics and drive. You need to make sure your reaction time has returned.   Increase activity slowly   Complete by: As directed    Lifting restrictions   Complete by: As directed    No lifting greater than 10 lbs, pushing,  pulling, straining for 4 weeks.   Sexual Activity Restrictions   Complete by: As directed    No sexual activity, nothing in the vagina, for 4-6 weeks.      Allergies as of 06/30/2024   No Known Allergies      Medication List     TAKE these medications    amoxicillin -clavulanate 875-125 MG tablet Commonly known as: AUGMENTIN  Take 1 tablet by mouth 2 (two) times daily.   doxycycline  100 MG tablet Commonly known as: VIBRA -TABS Take 1 tablet (100 mg total) by mouth 2 (two) times daily.   ondansetron  4 MG tablet Commonly known as: ZOFRAN  Take 1 tablet (4 mg total) by mouth every 8 (eight) hours as needed for nausea or vomiting.   oxymetazoline  0.05 % nasal spray Commonly known as: AFRIN Place 1 spray into both nostrils 2 (two) times daily as needed for congestion.   Systane Complete 0.6 % Soln Generic drug: Propylene Glycol Place 1 spray into both eyes 4 (four) times daily as needed (dry/irritated eyes.).   traMADol  50 MG tablet Commonly known as: ULTRAM  Take 1 tablet (50 mg total) by mouth every 6 (six) hours as needed for moderate pain (pain score 4-6). For AFTER surgery only, do not take and drive               Discharge Care Instructions  (From admission, onward)           Start     Ordered   06/30/24 0000  Discharge wound care:       Comments: Vulvar open incisions need to be changed/packed at least twice daily. If you have a BM and/or packing falls out, please change the dressing. If changed due to having BM, this occurrence can count towards the twice daily dressing changes. Plan to pack the open areas on the right and left vulva lightly with 1 inch iodoform packing. Do not pack forcefully on the lateral aspects of the openings to prevent opening of the rest of the vulva incision   06/30/24 0914            Follow-up Information     Micheline Setter D, NP Follow up on 07/04/2024.   Specialty: Gynecologic Oncology Why: Plan to follow up with Setter Micheline NP on 07/04/24 at the [facility]. Follow up with Dr. Eldonna is scheduled for 07/11/24. Contact information: [address] [place] [address] [phone]                 Greater than thirty minutes were spend for face to face discharge instructions and discharge orders/summary in EPIC.   Signed: Kayleana Waites D Breck [place] 06/30/2024, 10:13 AM     "

## 2024-06-30 NOTE — Progress Notes (Signed)
 Discharge medications delivered to patient at the bedside in a secure bag.

## 2024-07-01 ENCOUNTER — Telehealth: Payer: Self-pay | Admitting: *Deleted

## 2024-07-01 NOTE — Telephone Encounter (Signed)
 Spoke with Ms. Blystone this morning. She states she is eating, drinking and urinating well. She had  a BM yesterday prior to leaving the hospital. She reports that she has medication to manage her bowels at home if needed She denies fever or chills.  She is taking her 2 abx as ordered. She states that her husband is packing her dressing as instructed  Instructed to call office with any fever, chills, purulent drainage, uncontrolled pain or any other questions or concerns. Patient verbalizes understanding.   Pt aware of post op appointments as well as the office number [phone] and after hours number [phone] to call if she has any questions or concerns

## 2024-07-03 LAB — AEROBIC/ANAEROBIC CULTURE W GRAM STAIN (SURGICAL/DEEP WOUND)

## 2024-07-04 ENCOUNTER — Inpatient Hospital Stay: Admitting: Gynecologic Oncology

## 2024-07-04 VITALS — BP 121/58 | HR 65 | Temp 98.2°F | Resp 18 | Wt 111.6 lb

## 2024-07-04 DIAGNOSIS — T8149XA Infection following a procedure, other surgical site, initial encounter: Secondary | ICD-10-CM

## 2024-07-04 DIAGNOSIS — D071 Carcinoma in situ of vulva: Secondary | ICD-10-CM

## 2024-07-04 NOTE — Patient Instructions (Signed)
 You are healing well. Continue with the dressing changes twice daily and if the packing falls out.   Plan to follow up next week with Dr. Eldonna or sooner if needed.   You have completed the course of antibiotics.  Please call if you have any new symptoms such has redness, fever, increased drainage or pain.

## 2024-07-04 NOTE — Progress Notes (Signed)
 Gynecologic Oncology Hospital Follow Up  Jasmine Fox 64 y.o. female  CC: Open vulvar incision check s/p recent hospitalization with I&D and IV antibiotics  HPI: Jasmine Fox is a 64 year old female who was initially seen in consultation at the request of Dr. 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   Patient presented to OB/GYN on 04/12/2024 for routine exam.  At that time, she was noted on exam to have a thick white plaque on the vulva.  She was recommended to return for biopsy.  A punch biopsy was performed on 05/03/2024 which returned with squamous cell carcinoma in situ, peripheral margins involved.   At her consultation with Dr. Eldonna, the patient endorsed the history as above.  Reported that she had some itching in the area several years ago and was evaluated.  She reported she was prescribed cream at that time.  She felt that the area has gotten bigger and more itchy in the past year and occasionally bleeds.  Denied any other areas of abnormality that she was aware of.  Otherwise denies abdominal bloating, early satiety, significant weight loss, change in bowel or bladder habits.    She is s/p simple partial vulvectomy with bilateral rhomboid rotational flaps on 06/21/2024 with Dr. Eldonna for VIN 3. Final pathology returned with: A. PERINEAL BODY, VULVECTOMY:  - High-grade squamous intraepithelial lesion (VIN 3), focally extending  to the 3:00 tip.  - All other margins negative for dysplasia.  - Negative for invasive carcinoma.   On 06/27/2024, she was seen in the office and noted to have a vulvar incisional infection. She was ultimately admitted to the hospital for IV antibiotics and I&D of the vulvar incision. She was discharged home on 06/30/2024 on oral antibiotics and changing the open incision twice daily.  Interval History:  She presents today to the office for an incision check. She reports overall doing well at home. Tolerating  diet with no nausea or emesis. Bladder and bowels functioning without difficulty. Tolerated the oral antibiotics well. She has been slightly fatigued and having challenges staying asleep at night. The vulvar dressing changes are going well with the assistance of her husband. No fever, chills, excessive drainage from the wound.   Current Meds:  Outpatient Encounter Medications as of 07/04/2024  Medication Sig   amoxicillin -clavulanate (AUGMENTIN ) 875-125 MG tablet Take 1 tablet by mouth 2 (two) times daily.   doxycycline  (VIBRA -TABS) 100 MG tablet Take 1 tablet (100 mg total) by mouth 2 (two) times daily.   ondansetron  (ZOFRAN ) 4 MG tablet Take 1 tablet (4 mg total) by mouth every 8 (eight) hours as needed for nausea or vomiting.   oxymetazoline  (AFRIN) 0.05 % nasal spray Place 1 spray into both nostrils 2 (two) times daily as needed for congestion.   Propylene Glycol (SYSTANE COMPLETE) 0.6 % SOLN Place 1 spray into both eyes 4 (four) times daily as needed (dry/irritated eyes.).   traMADol  (ULTRAM ) 50 MG tablet Take 1 tablet (50 mg total) by mouth every 6 (six) hours as needed for moderate pain (pain score 4-6). For AFTER surgery only, do not take and drive   [DISCONTINUED] acetaminophen  (TYLENOL ) tablet 650 mg    [DISCONTINUED] artificial tears ophthalmic solution 1 drop    [DISCONTINUED] ceFEPIme  (MAXIPIME ) 2 g in sodium chloride  0.9 % 100 mL IVPB    [DISCONTINUED] Chlorhexidine  Gluconate Cloth 2 % PADS 6 each    [DISCONTINUED] enoxaparin  (LOVENOX ) injection 40 mg    [DISCONTINUED]  metroNIDAZOLE  (FLAGYL ) IVPB 500 mg    [DISCONTINUED] mupirocin  ointment (BACTROBAN ) 2 % 1 Application    [DISCONTINUED] ondansetron  (ZOFRAN ) injection 4 mg    [DISCONTINUED] ondansetron  (ZOFRAN ) tablet 4 mg    [DISCONTINUED] oxyCODONE  (Oxy IR/ROXICODONE ) immediate release tablet 5 mg    [DISCONTINUED] oxymetazoline  (AFRIN) 0.05 % nasal spray 1 spray    [DISCONTINUED] traMADol  (ULTRAM ) tablet 50 mg    [DISCONTINUED]  vancomycin  (VANCOCIN ) IVPB 1000 mg/200 mL premix    No facility-administered encounter medications on file as of 07/04/2024.    Allergy: NKA  Social Hx:   Social History   Socioeconomic History   Marital status: Married    Spouse name: Not on file   Number of children: 0   Years of education: Not on file   Highest education level: Not on file  Occupational History    Employer: [employer]  Tobacco Use   Smoking status: Former    Types: Cigarettes    Passive exposure: Current   Smokeless tobacco: Never   Tobacco comments:    Only about 2 packs cigarettes in lifetime  Vaping Use   Vaping status: Never Used  Substance and Sexual Activity   Alcohol  use: No    Alcohol /week: 0.0 standard drinks of alcohol    Drug use: No   Sexual activity: Not Currently    Birth control/protection: Post-menopausal  Other Topics Concern   Not on file  Social History Narrative   Not on file   Social Drivers of Health   Tobacco Use: Medium Risk (07/11/2024)   Patient History    Smoking Tobacco Use: Former    Smokeless Tobacco Use: Never    Passive Exposure: Current  Physicist, Medical Strain: Low Risk (04/12/2024)   Overall Financial Resource Strain (CARDIA)    Difficulty of Paying Living Expenses: Not hard at all  Food Insecurity: No Food Insecurity (06/27/2024)   Epic    Worried About Radiation Protection Practitioner of Food in the Last Year: Never true    Ran Out of Food in the Last Year: Never true  Transportation Needs: No Transportation Needs (06/27/2024)   Epic    Lack of Transportation (Medical): No    Lack of Transportation (Non-Medical): No  Physical Activity: Sufficiently Active (04/12/2024)   Exercise Vital Sign    Days of Exercise per Week: 4 days    Minutes of Exercise per Session: 60 min  Stress: No Stress Concern Present (04/12/2024)   [employer] of Occupational Health - Occupational Stress Questionnaire    Feeling of Stress: Not at all  Social Connections: Moderately Isolated (04/12/2024)    Social Connection and Isolation Panel    Frequency of Communication with Friends and Family: More than three times a week    Frequency of Social Gatherings with Friends and Family: Once a week    Attends Religious Services: Never    Database Administrator or Organizations: No    Attends Banker Meetings: Never    Marital Status: Married  Catering Manager Violence: Not At Risk (06/27/2024)   Epic    Fear of Current or Ex-Partner: No    Emotionally Abused: No    Physically Abused: No    Sexually Abused: No  Depression (PHQ2-9): Low Risk (05/23/2024)   Depression (PHQ2-9)    PHQ-2 Score: 0  Alcohol  Screen: Not on file  Housing: Low Risk (06/27/2024)   Epic    Unable to Pay for Housing in the Last Year: No    Number of Times Moved  in the Last Year: 0    Homeless in the Last Year: No  Utilities: Not At Risk (06/27/2024)   Epic    Threatened with loss of utilities: No  Health Literacy: Not on file    Past Surgical Hx:  Past Surgical History:  Procedure Laterality Date   BREAST BIOPSY Left 08/23/2008   DUCTAL PAPILLOMA WITH CALCIFICATIONS/ FIBROCYSTIC CHANGES AND CALCIFICATIONS   CATARACT EXTRACTION Left 07/27/2018   COLONOSCOPY N/A 11/10/2012   Procedure: COLONOSCOPY;  Surgeon: Lamar CHRISTELLA Hollingshead, MD;  [facility]: [facility];  Service: Endoscopy;  Laterality: N/A;  11:15   COLONOSCOPY WITH PROPOFOL  N/A 03/09/2023   Procedure: COLONOSCOPY WITH PROPOFOL ;  Surgeon: Hollingshead Lamar CHRISTELLA, MD;  Location: [facility];  Service: Endoscopy;  Laterality: N/A;  9:30 AM, ASA 2   ESOPHAGOGASTRODUODENOSCOPY  02/18/2002   MFM:Wnmfjo esophagus/s/p 54 French Maloney dilator   INCISION AND DRAINAGE ABSCESS N/A 06/28/2024   Procedure: INCISION AND DRAINAGE OF VULVAR WOUND ABSCESS;  Surgeon: Eldonna Mays, MD;  Location: [facility];  Service: Gynecology;  Laterality: N/A;   KIDNEY STONE SURGERY     lt bunion surgery     PARS PLANA VITRECTOMY W/REMOVE OF INTERNAL LIMITING MEMBRANE W/TAMPONADE Right  11/19/2023   Procedure: PARS PLANA VITRECTOMY WITH 25 GAUGE WITH REMOVAL OF INTERNAL LIMITING MEMBRANE OF RETINA WITH INTRAOCULAR TAMPONADE;  Surgeon: Valdemar Rogue, MD;  Location: [facility];  Service: Ophthalmology;  Laterality: Right;  C3F8 gas insertion   POLYPECTOMY  03/09/2023   Procedure: POLYPECTOMY INTESTINAL;  Surgeon: Hollingshead Lamar CHRISTELLA, MD;  Location: [facility];  Service: Endoscopy;;   VULVECTOMY N/A 06/21/2024   Procedure: SIMPLE PARTIAL VULVECTOMY WITH ROTATIONAL FLAP;  Surgeon: Eldonna Mays, MD;  Location: [facility];  Service: Gynecology;  Laterality: N/A;  WITH POSSIBLE ROTATIONAL FLAP    Past Medical Hx:  Past Medical History:  Diagnosis Date   Complication of anesthesia    Constipation    Migraine    none since 2013   Urolithiasis     Family Hx:  Family History  Problem Relation Age of Onset   Heart disease Mother        congestive heart failure   Diabetes Father    Hypertension Father    Colonic polyp Father    Brain cancer Brother    Hypertension Brother    Colon cancer Neg Hx    Breast cancer Neg Hx    Ovarian cancer Neg Hx    Endometrial cancer Neg Hx    Pancreatic cancer Neg Hx    Prostate cancer Neg Hx    On ROS intake form, + for HA, fatigue, trouble sleeping  Vitals:  Blood pressure (!) 121/58, pulse 65, temperature 98.2 F (36.8 C), temperature source Oral, resp. rate 18, weight 111 lb 9.6 oz (50.6 kg), SpO2 100%.  Physical Exam:  General: Well developed, well nourished female in no acute distress. Alert and oriented x 3. Non-toxic appearing. Cardiovascular: Regular rate and rhythm. S1 and S2 normal.  Lungs: Clear to auscultation bilaterally. No wheezes/crackles/rhonchi noted.  Abdomen: Abdomen soft, non-tender and non-obese. Active bowel sounds in all quadrants.  Genitourinary:   Open incisional areas on the bilateral vulva are healing well. No erythema or fluctuance. No drainage. Wound beds are pink without exudate. Extremities: No bilateral  cyanosis, edema, or clubbing.    Assessment/Plan: 64 year old female s/p simple partial vulvectomy with bilateral rhomboid rotational flap for VIN 3 s/p recent admission for IV antibiotics and I&D of vulvar incision  infection. She is doing well. The wound is healing without evidence of persistent infection. She completes her antibiotics today and will not need additional based on exam. Follow up has been arranged for one week. She is advised to continue with recommended dressing changes. Vulvar care reinforced. Reportable signs and symptoms reviewed. She is advised to call for any needs or concerns.  Eleanor JONETTA Epps, NP 07/13/2024, 11:21 AM

## 2024-07-11 ENCOUNTER — Encounter: Payer: Self-pay | Admitting: Psychiatry

## 2024-07-11 ENCOUNTER — Inpatient Hospital Stay: Admitting: Psychiatry

## 2024-07-11 VITALS — BP 110/73 | HR 72 | Temp 98.3°F | Resp 18 | Wt 112.6 lb

## 2024-07-11 DIAGNOSIS — Z9079 Acquired absence of other genital organ(s): Secondary | ICD-10-CM

## 2024-07-11 DIAGNOSIS — D071 Carcinoma in situ of vulva: Secondary | ICD-10-CM

## 2024-07-11 NOTE — Progress Notes (Signed)
 Gynecologic Oncology Return Clinic Visit  Date of Service: 07/11/2024 Referring Provider:  Vonn Inch, MD   Assessment & Plan: Jasmine Fox is a 64 y.o. woman with VIN3 who is s/p simple partial vulvectomy of the perineum with bilateral rhomboid rotational flaps on 06/21/24, c/b postop infection, s/p take back on for incision and drainage 06/28/24.  Postop infection: - Pt recovering well from surgery and healing appropriately postoperatively - Completed 7d course of abx. - Seen last week for wound check with good progress following discharge from hospital. - Ongoing postoperative expectations and precautions reviewed.  - plan 2 week follow-up for repeat wound check. Hopeful she will be nearly completely healed at that time.  VIN3: - Reviewed final pathology in detail. - No evidence of invasive cancer. Focal positive margin at 3:00 tip. - Reviewed signs/symptoms of recurrence.  - Surveillance reviewed. Follow-up q6 months x2 years then annually.     RTC 2 weeks for wound check, 79mo for surveillance.  Hoy Masters, MD Gynecologic Oncology    ----------------------- Reason for Visit: Postop/Treatment discussion  Treatment History: - Patient presented to OB/GYN on 04/12/2024 for routine exam. At that time, she was noted on exam to have a thick white plaque on the vulva. She was recommended to return for biopsy. A punch biopsy was performed on 05/03/2024 which returned with squamous cell carcinoma in situ, peripheral margins involved.  - simple partial vulvectomy 06/21/24. No invasive carcinoma. Focal positive margin for VIN at 3 o'clock tip.  Interval History: Patient reports that she thinks she is healing well.  Her husband is doing the dressing changes and reports that he is using less and less to pack the wound.  No drainage.  Did have a little bit of blood Saturday night after doing some extra walking.  No redness or swelling.  No fevers or chills.    Past  Medical/Surgical History: Past Medical History:  Diagnosis Date   Complication of anesthesia    Constipation    Migraine    none since 2013   Urolithiasis     Past Surgical History:  Procedure Laterality Date   BREAST BIOPSY Left 08/23/2008   DUCTAL PAPILLOMA WITH CALCIFICATIONS/ FIBROCYSTIC CHANGES AND CALCIFICATIONS   CATARACT EXTRACTION Left 07/27/2018   COLONOSCOPY N/A 11/10/2012   Procedure: COLONOSCOPY;  Surgeon: Lamar CHRISTELLA Hollingshead, MD;  [facility]: [facility];  Service: Endoscopy;  Laterality: N/A;  11:15   COLONOSCOPY WITH PROPOFOL  N/A 03/09/2023   Procedure: COLONOSCOPY WITH PROPOFOL ;  Surgeon: Hollingshead Lamar CHRISTELLA, MD;  [facility]: [facility];  Service: Endoscopy;  Laterality: N/A;  9:30 AM, ASA 2   ESOPHAGOGASTRODUODENOSCOPY  02/18/2002   MFM:Wnmfjo esophagus/s/p 54 French Maloney dilator   INCISION AND DRAINAGE ABSCESS N/A 06/28/2024   Procedure: INCISION AND DRAINAGE OF VULVAR WOUND ABSCESS;  Surgeon: Masters Hoy, MD;  Location: [facility];  Service: Gynecology;  Laterality: N/A;   KIDNEY STONE SURGERY     lt bunion surgery     PARS PLANA VITRECTOMY W/REMOVE OF INTERNAL LIMITING MEMBRANE W/TAMPONADE Right 11/19/2023   Procedure: PARS PLANA VITRECTOMY WITH 25 GAUGE WITH REMOVAL OF INTERNAL LIMITING MEMBRANE OF RETINA WITH INTRAOCULAR TAMPONADE;  Surgeon: Valdemar Rogue, MD;  Location: [facility] OR;  Service: Ophthalmology;  Laterality: Right;  C3F8 gas insertion   POLYPECTOMY  03/09/2023   Procedure: POLYPECTOMY INTESTINAL;  Surgeon: Hollingshead Lamar CHRISTELLA, MD;  [facility];  Service: Endoscopy;;   VULVECTOMY N/A 06/21/2024   Procedure: SIMPLE PARTIAL VULVECTOMY WITH ROTATIONAL FLAP;  Surgeon:  Eldonna Mays, MD;  Location: [facility];  Service: Gynecology;  Laterality: N/A;  WITH POSSIBLE ROTATIONAL FLAP    Family History  Problem Relation Age of Onset   Heart disease Mother        congestive heart failure   Diabetes Father    Hypertension Father    Colonic polyp Father     Brain cancer Brother    Hypertension Brother    Colon cancer Neg Hx    Breast cancer Neg Hx    Ovarian cancer Neg Hx    Endometrial cancer Neg Hx    Pancreatic cancer Neg Hx    Prostate cancer Neg Hx     Social History   Socioeconomic History   Marital status: Married    Spouse name: Not on file   Number of children: 0   Years of education: Not on file   Highest education level: Not on file  Occupational History    Employer: [employer]  Tobacco Use   Smoking status: Former    Types: Cigarettes    Passive exposure: Current   Smokeless tobacco: Never   Tobacco comments:    Only about 2 packs cigarettes in lifetime  Vaping Use   Vaping status: Never Used  Substance and Sexual Activity   Alcohol  use: No    Alcohol /week: 0.0 standard drinks of alcohol    Drug use: No   Sexual activity: Not Currently    Birth control/protection: Post-menopausal  Other Topics Concern   Not on file  Social History Narrative   Not on file   Social Drivers of Health   Tobacco Use: Medium Risk (06/28/2024)   Patient History    Smoking Tobacco Use: Former    Smokeless Tobacco Use: Never    Passive Exposure: Current  Physicist, Medical Strain: Low Risk (04/12/2024)   Overall Financial Resource Strain (CARDIA)    Difficulty of Paying Living Expenses: Not hard at all  Food Insecurity: No Food Insecurity (06/27/2024)   Epic    Worried About Radiation Protection Practitioner of Food in the Last Year: Never true    Ran Out of Food in the Last Year: Never true  Transportation Needs: No Transportation Needs (06/27/2024)   Epic    Lack of Transportation (Medical): No    Lack of Transportation (Non-Medical): No  Physical Activity: Sufficiently Active (04/12/2024)   Exercise Vital Sign    Days of Exercise per Week: 4 days    Minutes of Exercise per Session: 60 min  Stress: No Stress Concern Present (04/12/2024)   [employer] of Occupational Health - Occupational Stress Questionnaire    Feeling of Stress: Not at all   Social Connections: Moderately Isolated (04/12/2024)   Social Connection and Isolation Panel    Frequency of Communication with Friends and Family: More than three times a week    Frequency of Social Gatherings with Friends and Family: Once a week    Attends Religious Services: Never    Database Administrator or Organizations: No    Attends Banker Meetings: Never    Marital Status: Married  Depression (PHQ2-9): Low Risk (05/23/2024)   Depression (PHQ2-9)    PHQ-2 Score: 0  Alcohol  Screen: Not on file  Housing: Low Risk (06/27/2024)   Epic    Unable to Pay for Housing in the Last Year: No    Number of Times Moved in the Last Year: 0    Homeless in the Last Year: No  Utilities: Not At Risk (06/27/2024)  Epic    Threatened with loss of utilities: No  Health Literacy: Not on file    Current Medications: Current Medications[1]  Review of Symptoms: Complete 10-system review is positive for: Constipation, headache  Physical Exam: BP 110/73 (BP Location: Left Arm, Patient Position: Sitting)   Pulse 72   Temp 98.3 F (36.8 C) (Oral)   Resp 18   Wt 112 lb 9.6 oz (51.1 kg)   SpO2 100%   BMI 19.33 kg/m  General: Alert, oriented, no acute distress. HEENT: Normocephalic, atraumatic. Neck symmetric without masses. Sclera anicteric.  Chest: Normal work of breathing.  Extremities: Grossly normal range of motion.  Warm, well perfused.  No edema bilaterally. Skin: No rashes or lesions noted. GU: External genitalia with well-healing posterior vulvectomy and incision and drainage sites.  The tissue overlying the perineum is healing well.  The 2 lateral incisions that were opened have packing in place which was removed.  They are also healing in nicely with pink tissue.  The left is fairly shallow and the right is a little bit deeper but both much smaller than prior.  No surrounding erythema, induration, drainage.  Bilateral incisions repacked with 1/2 inch iodoform packing tape.   Exam chaperoned by Kimberly Jordan, CMA   Laboratory & Radiologic Studies: Surgical pathology (06/21/24): A. PERINEAL BODY, VULVECTOMY:  - High-grade squamous intraepithelial lesion (VIN 3), focally extending  to the 3:00 tip.  - All other margins negative for dysplasia.  - Negative for invasive carcinoma.      [1]  Current Outpatient Medications:    amoxicillin -clavulanate (AUGMENTIN ) 875-125 MG tablet, Take 1 tablet by mouth 2 (two) times daily., Disp: 8 tablet, Rfl: 0   doxycycline  (VIBRA -TABS) 100 MG tablet, Take 1 tablet (100 mg total) by mouth 2 (two) times daily., Disp: 8 tablet, Rfl: 0   ondansetron  (ZOFRAN ) 4 MG tablet, Take 1 tablet (4 mg total) by mouth every 8 (eight) hours as needed for nausea or vomiting., Disp: 20 tablet, Rfl: 0   oxymetazoline  (AFRIN) 0.05 % nasal spray, Place 1 spray into both nostrils 2 (two) times daily as needed for congestion., Disp: , Rfl:    Propylene Glycol (SYSTANE COMPLETE) 0.6 % SOLN, Place 1 spray into both eyes 4 (four) times daily as needed (dry/irritated eyes.)., Disp: , Rfl:    traMADol  (ULTRAM ) 50 MG tablet, Take 1 tablet (50 mg total) by mouth every 6 (six) hours as needed for moderate pain (pain score 4-6). For AFTER surgery only, do not take and drive, Disp: 10 tablet, Rfl: 0

## 2024-07-11 NOTE — Patient Instructions (Signed)
 It was a pleasure to see you in clinic today. - Healing well, making great progress - Return visit planned for 2 weeks with Melissa, 6 months with Dr. Eldonna  Thank you very much for allowing me to provide care for you today.  I appreciate your confidence in choosing our Gynecologic Oncology team at [facility]  If you have any questions about your visit today please call our office or send us  a MyChart message and we will get back to you as soon as possible.

## 2024-07-25 ENCOUNTER — Inpatient Hospital Stay: Attending: Psychiatry | Admitting: Gynecologic Oncology

## 2024-07-27 ENCOUNTER — Telehealth: Payer: Self-pay | Admitting: *Deleted

## 2024-07-27 NOTE — Telephone Encounter (Signed)
 LMOM for the patient to call the office back. Patient needs to be rescheduled with Eleanor Epps APP on 2/10

## 2024-08-02 ENCOUNTER — Inpatient Hospital Stay: Attending: Psychiatry | Admitting: Gynecologic Oncology

## 2024-10-14 ENCOUNTER — Encounter (INDEPENDENT_AMBULATORY_CARE_PROVIDER_SITE_OTHER): Admitting: Ophthalmology
